# Patient Record
Sex: Female | Born: 1950
Health system: Southern US, Community
[De-identification: ages and names within clinical notes are randomized; demographics above are authoritative.]

## PROBLEM LIST (undated history)

## (undated) DIAGNOSIS — I10 Essential (primary) hypertension: Secondary | ICD-10-CM

## (undated) DIAGNOSIS — D649 Anemia, unspecified: Secondary | ICD-10-CM

## (undated) DIAGNOSIS — N951 Menopausal and female climacteric states: Secondary | ICD-10-CM

## (undated) DIAGNOSIS — I639 Cerebral infarction, unspecified: Secondary | ICD-10-CM

## (undated) DIAGNOSIS — M25561 Pain in right knee: Secondary | ICD-10-CM

## (undated) DIAGNOSIS — J302 Other seasonal allergic rhinitis: Secondary | ICD-10-CM

## (undated) DIAGNOSIS — N939 Abnormal uterine and vaginal bleeding, unspecified: Secondary | ICD-10-CM

## (undated) HISTORY — DX: Abnormal uterine and vaginal bleeding, unspecified: N93.9

## (undated) HISTORY — DX: Other seasonal allergic rhinitis: J30.2

## (undated) HISTORY — PX: COLONOSCOPY: SHX174

## (undated) HISTORY — DX: Essential (primary) hypertension: I10

## (undated) HISTORY — DX: Pain in right knee: M25.561

## (undated) HISTORY — DX: Menopausal and female climacteric states: N95.1

## (undated) HISTORY — DX: Anemia, unspecified: D64.9

## (undated) HISTORY — DX: Cerebral infarction, unspecified: I63.9

## (undated) HISTORY — PX: COLON SURGERY: SHX602

---

## 2005-02-13 ENCOUNTER — Ambulatory Visit: Payer: Self-pay | Admitting: Internal Medicine

## 2006-04-08 ENCOUNTER — Ambulatory Visit: Payer: Self-pay | Admitting: Obstetrics and Gynecology

## 2006-07-11 ENCOUNTER — Ambulatory Visit: Payer: Self-pay | Admitting: Gastroenterology

## 2007-04-24 ENCOUNTER — Ambulatory Visit: Payer: Self-pay | Admitting: Obstetrics and Gynecology

## 2008-06-10 ENCOUNTER — Ambulatory Visit: Payer: Self-pay | Admitting: Obstetrics and Gynecology

## 2009-06-23 ENCOUNTER — Ambulatory Visit: Payer: Self-pay | Admitting: Obstetrics and Gynecology

## 2010-06-27 ENCOUNTER — Ambulatory Visit: Payer: Self-pay | Admitting: Obstetrics and Gynecology

## 2011-07-03 ENCOUNTER — Ambulatory Visit: Payer: Self-pay | Admitting: Internal Medicine

## 2012-07-03 ENCOUNTER — Ambulatory Visit: Payer: Self-pay | Admitting: Family Medicine

## 2012-07-08 ENCOUNTER — Ambulatory Visit: Payer: Self-pay | Admitting: Family Medicine

## 2013-07-09 ENCOUNTER — Ambulatory Visit: Payer: Self-pay | Admitting: Obstetrics and Gynecology

## 2013-11-18 DIAGNOSIS — I10 Essential (primary) hypertension: Secondary | ICD-10-CM | POA: Insufficient documentation

## 2013-11-18 DIAGNOSIS — D649 Anemia, unspecified: Secondary | ICD-10-CM | POA: Insufficient documentation

## 2013-11-18 HISTORY — DX: Anemia, unspecified: D64.9

## 2013-11-18 HISTORY — DX: Essential (primary) hypertension: I10

## 2014-07-13 ENCOUNTER — Ambulatory Visit: Payer: Self-pay | Admitting: Obstetrics and Gynecology

## 2014-08-05 DIAGNOSIS — N951 Menopausal and female climacteric states: Secondary | ICD-10-CM | POA: Insufficient documentation

## 2014-08-05 HISTORY — DX: Menopausal and female climacteric states: N95.1

## 2014-12-09 DIAGNOSIS — J302 Other seasonal allergic rhinitis: Secondary | ICD-10-CM

## 2014-12-09 HISTORY — DX: Other seasonal allergic rhinitis: J30.2

## 2015-05-10 ENCOUNTER — Other Ambulatory Visit: Payer: Self-pay | Admitting: Obstetrics and Gynecology

## 2015-05-10 DIAGNOSIS — Z1231 Encounter for screening mammogram for malignant neoplasm of breast: Secondary | ICD-10-CM

## 2015-07-19 ENCOUNTER — Ambulatory Visit
Admission: RE | Admit: 2015-07-19 | Discharge: 2015-07-19 | Disposition: A | Discharge status: Home / Self Care | Payer: No Typology Code available for payment source | Source: Ambulatory Visit | Attending: Obstetrics and Gynecology | Admitting: Obstetrics and Gynecology

## 2015-07-19 DIAGNOSIS — Z1231 Encounter for screening mammogram for malignant neoplasm of breast: Secondary | ICD-10-CM | POA: Diagnosis not present

## 2015-07-26 DIAGNOSIS — M25561 Pain in right knee: Secondary | ICD-10-CM

## 2015-07-26 HISTORY — DX: Pain in right knee: M25.561

## 2016-02-11 ENCOUNTER — Ambulatory Visit
Admission: EM | Admit: 2016-02-11 | Discharge: 2016-02-11 | Disposition: A | Discharge status: Home / Self Care | Payer: PPO | Attending: Family Medicine | Admitting: Family Medicine

## 2016-02-11 DIAGNOSIS — M5442 Lumbago with sciatica, left side: Secondary | ICD-10-CM

## 2016-02-11 HISTORY — DX: Essential (primary) hypertension: I10

## 2016-02-11 LAB — URINALYSIS COMPLETE WITH MICROSCOPIC (ARMC ONLY)
BILIRUBIN URINE: NEGATIVE
Bacteria, UA: NONE SEEN
GLUCOSE, UA: NEGATIVE mg/dL
Hgb urine dipstick: NEGATIVE
Leukocytes, UA: NEGATIVE
Nitrite: NEGATIVE
Protein, ur: NEGATIVE mg/dL
RBC / HPF: NONE SEEN RBC/hpf (ref 0–5)
SQUAMOUS EPITHELIAL / LPF: NONE SEEN
Specific Gravity, Urine: 1.015 (ref 1.005–1.030)
pH: 7.5 (ref 5.0–8.0)

## 2016-02-11 MED ORDER — TRAMADOL HCL 50 MG PO TABS: 50.0000 mg | ORAL_TABLET | Freq: Three times a day (TID) | ORAL | Status: DC | PRN

## 2016-02-11 MED ORDER — MELOXICAM 15 MG PO TABS: 15.0000 mg | ORAL_TABLET | Freq: Every day | ORAL | Status: DC | PRN

## 2016-02-11 NOTE — Discharge Instructions (Signed)
Take medication as prescribed. Rest. Apply ice. Stretch. Take your home medication as prescribed.  Follow up with your primary care physician this week. Return to Urgent care for new or worsening concerns.    Sciatica Sciatica is pain, weakness, numbness, or tingling along the path of the sciatic nerve. The nerve starts in the lower back and runs down the back of each leg. The nerve controls the muscles in the lower leg and in the back of the knee, while also providing sensation to the back of the thigh, lower leg, and the sole of your foot. Sciatica is a symptom of another medical condition. For instance, nerve damage or certain conditions, such as a herniated disk or bone spur on the spine, pinch or put pressure on the sciatic nerve. This causes the pain, weakness, or other sensations normally associated with sciatica. Generally, sciatica only affects one side of the body. CAUSES   Herniated or slipped disc.  Degenerative disk disease.  A pain disorder involving the narrow muscle in the buttocks (piriformis syndrome).  Pelvic injury or fracture.  Pregnancy.  Tumor (rare). SYMPTOMS  Symptoms can vary from mild to very severe. The symptoms usually travel from the low back to the buttocks and down the back of the leg. Symptoms can include:  Mild tingling or dull aches in the lower back, leg, or hip.  Numbness in the back of the calf or sole of the foot.  Burning sensations in the lower back, leg, or hip.  Sharp pains in the lower back, leg, or hip.  Leg weakness.  Severe back pain inhibiting movement. These symptoms may get worse with coughing, sneezing, laughing, or prolonged sitting or standing. Also, being overweight may worsen symptoms. DIAGNOSIS  Your caregiver will perform a physical exam to look for common symptoms of sciatica. He or she may ask you to do certain movements or activities that would trigger sciatic nerve pain. Other tests may be performed to find the cause of  the sciatica. These may include:  Blood tests.  X-rays.  Imaging tests, such as an MRI or CT scan. TREATMENT  Treatment is directed at the cause of the sciatic pain. Sometimes, treatment is not necessary and the pain and discomfort goes away on its own. If treatment is needed, your caregiver may suggest:  Over-the-counter medicines to relieve pain.  Prescription medicines, such as anti-inflammatory medicine, muscle relaxants, or narcotics.  Applying heat or ice to the painful area.  Steroid injections to lessen pain, irritation, and inflammation around the nerve.  Reducing activity during periods of pain.  Exercising and stretching to strengthen your abdomen and improve flexibility of your spine. Your caregiver may suggest losing weight if the extra weight makes the back pain worse.  Physical therapy.  Surgery to eliminate what is pressing or pinching the nerve, such as a bone spur or part of a herniated disk. HOME CARE INSTRUCTIONS   Only take over-the-counter or prescription medicines for pain or discomfort as directed by your caregiver.  Apply ice to the affected area for 20 minutes, 3-4 times a day for the first 48-72 hours. Then try heat in the same way.  Exercise, stretch, or perform your usual activities if these do not aggravate your pain.  Attend physical therapy sessions as directed by your caregiver.  Keep all follow-up appointments as directed by your caregiver.  Do not wear high heels or shoes that do not provide proper support.  Check your mattress to see if it is too soft.  A firm mattress may lessen your pain and discomfort. SEEK IMMEDIATE MEDICAL CARE IF:   You lose control of your bowel or bladder (incontinence).  You have increasing weakness in the lower back, pelvis, buttocks, or legs.  You have redness or swelling of your back.  You have a burning sensation when you urinate.  You have pain that gets worse when you lie down or awakens you at  night.  Your pain is worse than you have experienced in the past.  Your pain is lasting longer than 4 weeks.  You are suddenly losing weight without reason. MAKE SURE YOU:  Understand these instructions.  Will watch your condition.  Will get help right away if you are not doing well or get worse.   This information is not intended to replace advice given to you by your health care provider. Make sure you discuss any questions you have with your health care provider.   Document Released: 07/03/2001 Document Revised: 03/30/2015 Document Reviewed: 11/18/2011 Elsevier Interactive Patient Education [employer]

## 2016-02-11 NOTE — ED Provider Notes (Signed)
[facility]  ____________________________________________  Time seen: Approximately 12:40 PM  I have reviewed the triage vital signs and the nursing notes.   HISTORY  Chief Complaint Back Pain   HPI Abigail Malone is a 65 y.o. female presents with a complaint of left lower back pain. Patient reports pain is been present 4 days and gradual onset. Patient reports that just prior to onset of back pain, the 2 days before she increased her walking and exercise regimen. Patient states she's trying to lose weight and has been walking much more frequently this week. Denies any fall or direct trauma. Patient states pain is primarily with movement. Patient states that she sits completely still she has minimal pain. Patient states pain is in the left lower back buttocks region. Patient states occasionally the pain will radiate down the back of her left leg but does not pass her knee. Denies any numbness or tingling sensation. Denies any urinary or bowel retention or incontinence. Patient reports that she has felt like she's had a slight increase of urinary frequency 2 days ago but states she is also been drinking more water. States over-the-counter Aleve helps with pain.  Denies fall or direct trauma. Denies numbness or tingling sensation. Denies recent sickness.  Patient reports she does have "a lot of arthritis "an intermediate does have low back pain that is slightly similar to current. Denies chest pain, shortness of breath, abdominal pain, urinary pain, vaginal complaints, blood in urine, blood in stool, dizziness, weakness or other complaints. Reports has remained ambulatory and has remained active. Denies any history of renal disease.   PCP: Elmer Ramp   Past Medical History  Diagnosis Date  . Hypertension     There are no active problems to display for this patient.   History reviewed. No pertinent past surgical history.  Current Outpatient Rx  Name  Route  Sig  Dispense   Refill  . amLODipine (NORVASC) 5 MG tablet   Oral   Take 5 mg by mouth daily. Pt has not been taken from past 2 week giving tingling and cramping so stop taking it.         Marland Kitchen lisinopril (PRINIVIL,ZESTRIL) 20 MG tablet   Oral   Take 20 mg by mouth daily.         .           .             Allergies Review of patient's allergies indicates no known allergies.  No family history on file.  Social History Social History  Substance Use Topics  . Smoking status: Former Games developer  . Smokeless tobacco: None  . Alcohol Use: Yes    Review of Systems Constitutional: No fever/chills Eyes: No visual changes. ENT: No sore throat. Cardiovascular: Denies chest pain. Respiratory: Denies shortness of breath. Gastrointestinal: No abdominal pain.  No nausea, no vomiting.  No diarrhea.  No constipation. Genitourinary: Negative for dysuria. Musculoskeletal: positive for back pain. Skin: Negative for rash. Neurological: Negative for headaches, focal weakness or numbness.  10-point ROS otherwise negative.  ____________________________________________   PHYSICAL EXAM:  VITAL SIGNS: ED Triage Vitals  Enc Vitals Group     BP 02/11/16 1130 185/141 mmHg     Pulse Rate 02/11/16 1130 77     Resp 02/11/16 1130 16     Temp 02/11/16 1130 97.2 F (36.2 C)     Temp Source 02/11/16 1130 Oral     SpO2 02/11/16 1130 100 %  Weight 02/11/16 1130 140 lb (63.504 kg)     Height 02/11/16 1130 5" (0.127 m)     Head Cir --      Peak Flow --      Pain Score 02/11/16 1130 8     Pain Loc --      Pain Edu? --      Excl. in GC? --    Today's Vitals   02/11/16 1130 02/11/16 1134 02/11/16 1239  BP: 185/141 155/74   Pulse: 77    Temp: 97.2 F (36.2 C)    TempSrc: Oral    Resp: 16    Height: 5" (0.127 m)    Weight: 140 lb (63.504 kg)    SpO2: 100%    PainSc: 8   0-No pain   States has taken her amlodipine yet today.   Constitutional: Alert and oriented. Well appearing and in no acute  distress. Eyes: Conjunctivae are normal. PERRL. EOMI. Head: Atraumatic.  Ears: normal external appearance bilaterally.   Nose: No congestion/rhinnorhea.  Mouth/Throat: Mucous membranes are moist.  Oropharynx non-erythematous. Neck: No stridor.  No cervical spine tenderness to palpation. Cardiovascular: Normal rate, regular rhythm. Grossly normal heart sounds.  Good peripheral circulation. Respiratory: Normal respiratory effort.  No retractions. Lungs CTAB. No wheezes, rales or rhonchi.  Gastrointestinal: Soft and nontender. No distention. Normal Bowel sounds.  No CVA tenderness. Musculoskeletal: No lower or upper extremity tenderness nor edema.  Bilateral pedal pulses equal and easily palpated. No midline cervical, thoracic or lumbar tenderness to palpation. Patient with mild to moderate left greater sciatic notch tenderness and mild left paralumbar tenderness to palpation, no midline tenderness, no swelling, no ecchymosis, no erythema, no saddle anesthesia, mild left lower back pain with left straight leg raise, steady gait, changes positions quickly in room, bilateral plantar flexion and dorsiflexion strong and equal, full range of motion but pain present with lumbar twisting and left leg left, no calf tenderness bilaterally. Neurologic:  Normal speech and language. No gross focal neurologic deficits are appreciated. No gait instability. Skin:  Skin is warm, dry and intact. No rash noted. Psychiatric: Mood and affect are normal. Speech and behavior are normal.  ____________________________________________   LABS (all labs ordered are listed, but only abnormal results are displayed)  Labs Reviewed  URINALYSIS COMPLETEWITH MICROSCOPIC ([facility] ONLY) - Abnormal; Notable for the following:    Ketones, ur TRACE (*)    All other components within normal limits   Patients most recent labs in epic reviewed.    INITIAL IMPRESSION / ASSESSMENT AND PLAN / ED COURSE  Pertinent labs & imaging  results that were available during my care of the patient were reviewed by me and considered in my medical decision making (see chart for details).  Very well-appearing patient. No acute distress. No focal neurological deficits. Patient with point tenderness left lower back and greater sciatic notch. Full range of motion but pain present with lumbar twisting and left leg left. No trauma. Suspect  left lower sciatica secondary to recent increase in activity and walking. Will also evaluate urinalysis to ensure no UTI.  Urinalysis reviewed, and no bacteria, clear, no leukocytes, no protein and no RBCs. Suspect left sciatica. Discussed with patient we'll treat conservatively with oral Mobic and when necessary tramadol. Encouraged ice, stretching and avoidance of reinjury. Encourage PCP follow-up for pain as needed. Also encouraged patient to follow-up regarding her blood pressure and to ensure she's taking her medication as prescribed. Discussed indication, risks and benefits of medications with  patient.  Discussed follow up with Primary care physician this week. Discussed follow up and return parameters including no resolution or any worsening concerns. Patient verbalized understanding and agreed to plan.   ____________________________________________   FINAL CLINICAL IMPRESSION(S) / ED DIAGNOSES  Final diagnoses:  Left-sided low back pain with left-sided sciatica     New Prescriptions   MELOXICAM (MOBIC) 15 MG TABLET    Take 1 tablet (15 mg total) by mouth daily as needed for pain.   TRAMADOL (ULTRAM) 50 MG TABLET    Take 1 tablet (50 mg total) by mouth every 8 (eight) hours as needed (Do not drive or operate machinery while taking as can cause drowsiness.).    Note: This dictation was prepared with Dragon dictation along with smaller phrase technology. Any transcriptional errors that result from this process are unintentional.       Renford Dills, NP 02/11/16 1358

## 2016-02-11 NOTE — ED Notes (Signed)
As per pt had lower back pain radiate down to legs, hip Left side, onset 4 days hard ROM or bend over pt used Aleve 2 tab 200 mg and done heat pad.

## 2016-02-21 DIAGNOSIS — M5442 Lumbago with sciatica, left side: Secondary | ICD-10-CM | POA: Diagnosis not present

## 2016-05-15 ENCOUNTER — Other Ambulatory Visit: Payer: Self-pay | Admitting: Obstetrics and Gynecology

## 2016-05-15 DIAGNOSIS — Z1231 Encounter for screening mammogram for malignant neoplasm of breast: Secondary | ICD-10-CM

## 2016-05-15 DIAGNOSIS — Z124 Encounter for screening for malignant neoplasm of cervix: Secondary | ICD-10-CM | POA: Diagnosis not present

## 2016-05-15 DIAGNOSIS — Z1151 Encounter for screening for human papillomavirus (HPV): Secondary | ICD-10-CM | POA: Diagnosis not present

## 2016-05-15 DIAGNOSIS — N950 Postmenopausal bleeding: Secondary | ICD-10-CM | POA: Diagnosis not present

## 2016-05-15 DIAGNOSIS — N939 Abnormal uterine and vaginal bleeding, unspecified: Secondary | ICD-10-CM | POA: Diagnosis not present

## 2016-05-15 DIAGNOSIS — Z01419 Encounter for gynecological examination (general) (routine) without abnormal findings: Secondary | ICD-10-CM | POA: Diagnosis not present

## 2016-05-15 DIAGNOSIS — Z1211 Encounter for screening for malignant neoplasm of colon: Secondary | ICD-10-CM | POA: Diagnosis not present

## 2016-05-15 HISTORY — DX: Abnormal uterine and vaginal bleeding, unspecified: N93.9

## 2016-05-25 DIAGNOSIS — N939 Abnormal uterine and vaginal bleeding, unspecified: Secondary | ICD-10-CM | POA: Diagnosis not present

## 2016-05-31 DIAGNOSIS — N939 Abnormal uterine and vaginal bleeding, unspecified: Secondary | ICD-10-CM | POA: Diagnosis not present

## 2016-06-19 DIAGNOSIS — Z1211 Encounter for screening for malignant neoplasm of colon: Secondary | ICD-10-CM | POA: Diagnosis not present

## 2016-06-19 DIAGNOSIS — I10 Essential (primary) hypertension: Secondary | ICD-10-CM | POA: Diagnosis not present

## 2016-07-19 ENCOUNTER — Ambulatory Visit: Admission: RE | Admit: 2016-07-19 | Payer: PPO | Source: Ambulatory Visit

## 2016-07-26 DIAGNOSIS — K648 Other hemorrhoids: Secondary | ICD-10-CM | POA: Diagnosis not present

## 2016-07-26 DIAGNOSIS — Z1211 Encounter for screening for malignant neoplasm of colon: Secondary | ICD-10-CM | POA: Diagnosis not present

## 2016-07-31 ENCOUNTER — Ambulatory Visit
Admission: RE | Admit: 2016-07-31 | Discharge: 2016-07-31 | Disposition: A | Discharge status: Home / Self Care | Payer: PPO | Source: Ambulatory Visit | Attending: Obstetrics and Gynecology | Admitting: Obstetrics and Gynecology

## 2016-07-31 DIAGNOSIS — Z1231 Encounter for screening mammogram for malignant neoplasm of breast: Secondary | ICD-10-CM | POA: Insufficient documentation

## 2016-09-04 DIAGNOSIS — J019 Acute sinusitis, unspecified: Secondary | ICD-10-CM | POA: Diagnosis not present

## 2016-09-04 DIAGNOSIS — I10 Essential (primary) hypertension: Secondary | ICD-10-CM | POA: Diagnosis not present

## 2016-09-18 DIAGNOSIS — J3089 Other allergic rhinitis: Secondary | ICD-10-CM | POA: Diagnosis not present

## 2017-03-05 DIAGNOSIS — I8393 Asymptomatic varicose veins of bilateral lower extremities: Secondary | ICD-10-CM | POA: Diagnosis not present

## 2017-03-05 DIAGNOSIS — Z131 Encounter for screening for diabetes mellitus: Secondary | ICD-10-CM | POA: Diagnosis not present

## 2017-03-05 DIAGNOSIS — Z79899 Other long term (current) drug therapy: Secondary | ICD-10-CM | POA: Diagnosis not present

## 2017-03-05 DIAGNOSIS — J302 Other seasonal allergic rhinitis: Secondary | ICD-10-CM | POA: Diagnosis not present

## 2017-03-05 DIAGNOSIS — I10 Essential (primary) hypertension: Secondary | ICD-10-CM | POA: Diagnosis not present

## 2017-03-05 DIAGNOSIS — Z23 Encounter for immunization: Secondary | ICD-10-CM | POA: Diagnosis not present

## 2017-03-05 DIAGNOSIS — Z1159 Encounter for screening for other viral diseases: Secondary | ICD-10-CM | POA: Diagnosis not present

## 2017-03-05 DIAGNOSIS — R252 Cramp and spasm: Secondary | ICD-10-CM | POA: Diagnosis not present

## 2017-03-05 DIAGNOSIS — E669 Obesity, unspecified: Secondary | ICD-10-CM | POA: Diagnosis not present

## 2017-03-19 DIAGNOSIS — H2513 Age-related nuclear cataract, bilateral: Secondary | ICD-10-CM | POA: Diagnosis not present

## 2017-03-22 DIAGNOSIS — I10 Essential (primary) hypertension: Secondary | ICD-10-CM | POA: Diagnosis not present

## 2017-06-25 ENCOUNTER — Other Ambulatory Visit: Payer: Self-pay | Admitting: Obstetrics and Gynecology

## 2017-06-25 DIAGNOSIS — Z1211 Encounter for screening for malignant neoplasm of colon: Secondary | ICD-10-CM | POA: Diagnosis not present

## 2017-06-25 DIAGNOSIS — Z1231 Encounter for screening mammogram for malignant neoplasm of breast: Secondary | ICD-10-CM

## 2017-06-25 DIAGNOSIS — Z124 Encounter for screening for malignant neoplasm of cervix: Secondary | ICD-10-CM | POA: Diagnosis not present

## 2017-07-11 NOTE — Progress Notes (Signed)
07/12/2017 9:43 AM   Abigail Malone [birth year] [id number]  Referring provider: Sula Rumple, MD No address on file  Chief Complaint  Patient presents with  . Urinary Incontinence    New Patient    HPI: Patient is a 66 -year-old [place] American female who is referred to [place] by, Dalbert Batman, CNM, for urinary incontinence.  Patient states that she has had urinary incontinence for over one year.    Patient has incontinence with urge and stress.   She is experiencing 2 incontinent episodes during the day. She is experiencing no incontinent episodes during the night.  Her incontinence volume is large, sometimes soaking through her clothes.   She is wearing 2 to 3 pads/depends daily.  (POISE # 3/4)  She is having associated urgency.  Her PVR is 14 mL.    She does not have a history of urinary tract infections, STI's or injury to the bladder.   She denies dysuria, gross hematuria, suprapubic pain, back pain, abdominal pain or flank pain.  She has not had any recent fevers, chills, nausea or vomiting.    She does not have a history of nephrolithiasis, GU surgery or GU trauma.   She is not sexually active.  She is post menopausal.   She admits to diarrhea.   She is not having pain with bladder filling.    She has not had any recent imaging studies.    She is drinking 2 to 3 bottles of water daily.   She is drinking 1 to 2 caffeinated beverages daily.   No sodas.  She is not drinking alcoholic beverages daily.    Her risk factors for incontinence are obesity, vaginal delivery, a family history of incontinence, age, caffeine and vaginal atrophy.    She is taking ACE inhibitors.      PMH: Past Medical History:  Diagnosis Date  . Abnormal uterine bleeding (AUB) 05/15/2016  . Allergic rhinitis, seasonal 12/09/2014  . Anemia, unspecified 11/18/2013  . HTN (hypertension) 11/18/2013  . Hypertension   . Menopausal symptoms 08/05/2014  . Right knee pain 07/26/2015     Surgical History: Past Surgical History:  Procedure Laterality Date  . COLON SURGERY      Home Medications:  Allergies as of 07/12/2017      Reactions   Amlodipine    Other reaction(s): Other (See Comments) Leg cramping      Medication List        Accurate as of 07/12/17  9:43 AM. Always use your most recent med list.          amLODipine 5 MG tablet Commonly known as:  NORVASC Take 5 mg by mouth daily. Pt has not been taken from past 2 week giving tingling and cramping so stop taking it.   conjugated estrogens vaginal cream Commonly known as:  PREMARIN Place 1 Applicatorful vaginally daily. Apply 0.5mg  (pea-sized amount)  just inside the vaginal introitus with a finger-tip on  Monday, Wednesday and Friday nights.   estradiol 0.1 MG/GM vaginal cream Commonly known as:  ESTRACE VAGINAL Apply 0.5mg  (pea-sized amount)  just inside the vaginal introitus with a finger-tip on Monday, Wednesday and Friday nights.   lisinopril-hydrochlorothiazide 20-12.5 MG tablet Commonly known as:  PRINZIDE,ZESTORETIC Take by mouth.       Allergies:  Allergies  Allergen Reactions  . Amlodipine     Other reaction(s): Other (See Comments) Leg cramping    Family History: Family History  Problem Relation Age of Onset  .  Hematuria Mother   . Liver cancer Mother   . Prostate cancer Father   . Kidney failure Brother   . Breast cancer Neg Hx     Social History:  reports that she has quit smoking. she has never used smokeless tobacco. She reports that she drinks alcohol. She reports that she does not use drugs.  ROS: UROLOGY Frequent Urination?: No Hard to postpone urination?: Yes Burning/pain with urination?: No Get up at night to urinate?: No Leakage of urine?: No Urine stream starts and stops?: No Trouble starting stream?: No Do you have to strain to urinate?: No Blood in urine?: No Urinary tract infection?: No Sexually transmitted disease?: No Injury to kidneys or  bladder?: No Painful intercourse?: No Weak stream?: No Currently pregnant?: No Vaginal bleeding?: No Last menstrual period?: n  Gastrointestinal Nausea?: No Vomiting?: No Indigestion/heartburn?: No Diarrhea?: No Constipation?: No  Constitutional Fever: No Night sweats?: Yes Weight loss?: No Fatigue?: Yes  Skin Skin rash/lesions?: No Itching?: No  Eyes Blurred vision?: No Double vision?: No  Ears/Nose/Throat Sore throat?: No Sinus problems?: Yes  Hematologic/Lymphatic Swollen glands?: No Easy bruising?: No  Cardiovascular Leg swelling?: No Chest pain?: No  Respiratory Cough?: No Shortness of breath?: No  Endocrine Excessive thirst?: No  Musculoskeletal Back pain?: Yes Joint pain?: Yes  Neurological Headaches?: No Dizziness?: No  Psychologic Depression?: No Anxiety?: Yes  Physical Exam: BP (!) 146/77   Pulse 72   Ht 4\' 11"  (1.499 m)   Wt 158 lb (71.7 kg)   BMI 31.91 kg/m   Constitutional: Well nourished. Alert and oriented, No acute distress. HEENT: [place] AT, moist mucus membranes. Trachea midline, no masses. Cardiovascular: No clubbing, cyanosis, or edema. Respiratory: Normal respiratory effort, no increased work of breathing. GI: Abdomen is soft, non tender, non distended, no abdominal masses. Liver and spleen not palpable.  No hernias appreciated.  Stool sample for occult testing is not indicated.   GU: No CVA tenderness.  No bladder fullness or masses.  Arophic external genitalia, normal pubic hair distribution, no lesions.  Normal urethral meatus, no lesions, no prolapse, no discharge.   No urethral masses, tenderness and/or tenderness. No bladder fullness, tenderness or masses. pale vagina mucosa, good estrogen effect, no discharge, no lesions, good pelvic support, Grade I cystocele.  No rectocele.  No cervical motion tenderness.  Uterus is freely mobile and non-fixed.  No adnexal/parametria masses or tenderness noted.  Anus and perineum are  without rashes or lesions.    Skin: No rashes, bruises or suspicious lesions. Lymph: No cervical or inguinal adenopathy. Neurologic: Grossly intact, no focal deficits, moving all 4 extremities. Psychiatric: Normal mood and affect.  Laboratory Data: Baseline creatinine 0.6 I have reviewed the labs.   Pertinent Imaging: Results for Abigail ProseWALKER, Abigail Malone (MRN [id number]) as of 07/12/2017 09:27  Ref. Range 07/12/2017 09:10  Scan Result Unknown 14ml     Assessment & Plan:    1.Mixed and urge incontinence  - offered behavioral therapies, bladder training, bladder control strategies and  pelvic floor muscle training - patient deferred  - fluid management - good water intake  - offered medical therapy with anticholinergic therapy or beta-3 adrenergic receptor agonist and the potential side effects of each therapy   - would like to try the beta-3 adrenergic receptor agonist (Myrbetriq).  Given Myrbetriq 25 mg samples, #28.  I have reviewed with the patient of the side effects of Myrbetriq, such as: elevation in BP, urinary retention and/or HA.    - RTC in  3 weeks for PVR and symptom recheck   2. Vaginal atrophy  - I explained to the patient that when women go through menopause and her estrogen levels are severely diminished, the normal vaginal flora will change.  This is due to an increase of the vaginal canal's pH. Because of this, the vaginal canal may be colonized by bacteria from the rectum instead of the protective lactobacillus.  This, accompanied by the loss of the mucus barrier with vaginal atrophy, is a cause of recurrent urinary tract infections.  - In some studies, the use of vaginal estrogen cream has been demonstrated to reduce  recurrent urinary tract infections to one a year.   - Patient was given a sample of vaginal estrogen cream (Premarin vaginal cream) and instructed to apply 0.5mg  (pea-sized amount)  just inside the vaginal introitus with a finger-tip on Monday, Wednesday and  Friday nights.  I explained to the patient that vaginally administered estrogen, which causes only a slight increase in the blood estrogen levels, have fewer contraindications and adverse systemic effects that oral HT.  - I have also given prescriptions for the Estrace cream and Premarin cream, so that the patient may carry them to the pharmacy to see which one of the branded creams would be most economical for her.  If she finds both medications cost prohibitive, she is instructed to call the office.  We can then call in a compounded vaginal estrogen cream for the patient that may be more affordable.    - She will follow up in three months for an exam.     Return in about 3 weeks (around 08/02/2017) for PVR and OAB questionnaire.  These notes generated with voice recognition software. I apologize for typographical errors.  Michiel CowboySHANNON Mayank Teuscher, PA-C  [facility] [address], [address] [place], [place] [address] [phone]

## 2017-07-12 ENCOUNTER — Encounter: Payer: Self-pay | Admitting: Urology

## 2017-07-12 ENCOUNTER — Ambulatory Visit (INDEPENDENT_AMBULATORY_CARE_PROVIDER_SITE_OTHER): Payer: PPO | Admitting: Urology

## 2017-07-12 VITALS — BP 146/77 | HR 72 | Ht 59.0 in | Wt 158.0 lb

## 2017-07-12 DIAGNOSIS — N952 Postmenopausal atrophic vaginitis: Secondary | ICD-10-CM

## 2017-07-12 DIAGNOSIS — N3946 Mixed incontinence: Secondary | ICD-10-CM | POA: Diagnosis not present

## 2017-07-12 LAB — BLADDER SCAN AMB NON-IMAGING

## 2017-07-12 MED ORDER — ESTROGENS, CONJUGATED 0.625 MG/GM VA CREA: 1.0000 | TOPICAL_CREAM | Freq: Every day | VAGINAL | 12 refills | Status: DC

## 2017-07-12 MED ORDER — ESTRADIOL 0.1 MG/GM VA CREA: TOPICAL_CREAM | VAGINAL | 12 refills | Status: DC

## 2017-07-12 NOTE — Patient Instructions (Signed)
It is important that you drink 6 to 8 glasses of water daily.  Half of the fluid intake should be water. Janina Mayorach most of your fluids before dinner. Don't drink too much liquid at once, sip instead of gulp.  There are certain beverages and foods that have known to the bladder irritants and they should be avoided or consumed infrequently. These include but not limited to beverages and foods containing caffeine, carbonated beverages, highly acidic or spicy foods, such as fruits, tomato products, artificial sweeteners and alcohol.  If you find it difficult to avoid these foods it may be helpful to alternate water in between consuming these foods.  It is important to stay well hydrated and eat 24 to 30 grams of fiber daily.    It has shown that even weight loss can help with urinary incontinence. In this study it demonstrated that and a percent weight loss and obese women (20 pound loss for a 250 pound woman) cut the number of incontinence episodes nearly in half.  I have given you two prescriptions for a vaginal estrogen cream.  Estrace and Premarin.  Please take these to your pharmacy and see which one your insurance covers.  If both are too expensive, please call the office at [phone] for an alternative.  Apply 0.5mg  (pea-sized amount)  just inside the vaginal introitus with a finger-tip on Monday, Wednesday and Friday nights,

## 2017-08-01 ENCOUNTER — Ambulatory Visit
Admission: RE | Admit: 2017-08-01 | Discharge: 2017-08-01 | Disposition: A | Discharge status: Home / Self Care | Payer: PPO | Source: Ambulatory Visit | Attending: Obstetrics and Gynecology | Admitting: Obstetrics and Gynecology

## 2017-08-01 DIAGNOSIS — Z1231 Encounter for screening mammogram for malignant neoplasm of breast: Secondary | ICD-10-CM

## 2017-08-01 NOTE — Progress Notes (Signed)
08/02/2017 9:24 AM   [name] 08-02-1950 [id number]  Referring provider: Cheryll Dessert, FNP [address] [name], [place] [address]  Chief Complaint  Patient presents with  . Urinary Incontinence    3wk w/PVR    HPI: 67 yo AAF with mixed urinary incontinence and vaginal atrophy who presents today for a three week follow up after a trial of Myrbetriq.  Background history Patient is a 75 -year-old [place] American female who is referred to [place] by, Dalbert Batman, CNM, for urinary incontinence.  Patient states that she has had urinary incontinence for over one year.  Patient has incontinence with urge and stress.   She is experiencing 2 incontinent episodes during the day. She is experiencing no incontinent episodes during the night.  Her incontinence volume is large, sometimes soaking through her clothes.   She is wearing 2 to 3 pads/depends daily.  (POISE # 3/4)  She is having associated urgency.  Her PVR is 14 mL.  She does not have a history of urinary tract infections, STI's or injury to the bladder.  She denies dysuria, gross hematuria, suprapubic pain, back pain, abdominal pain or flank pain.  She has not had any recent fevers, chills, nausea or vomiting.  She does not have a history of nephrolithiasis, GU surgery or GU trauma.   She is not sexually active.  She is post menopausal.  She admits to diarrhea.  She is not having pain with bladder filling.  She has not had any recent imaging studies.  She is drinking 2 to 3 bottles of water daily.   She is drinking 1 to 2 caffeinated beverages daily.   No sodas.  She is not drinking alcoholic beverages daily.   Her risk factors for incontinence are obesity, vaginal delivery, a family history of incontinence, age, caffeine and vaginal atrophy.  She is taking ACE inhibitors.      Today, she is having urgency x 0-3, frequency x 4-7, not restricting fluids to avoid visits to the restroom, is engaging in  toilet mapping, incontinence x 0-3 and nocturia x 0-3.   Her PVR is 86 mL.   Her BP is 154/72  She denies any gross hematuria, dysuria and suprapubic pain.  She is not having any fevers, chills, nausea or vomiting.   She is finding the Myrbetriq 25 mg daily is some what helpful.  PMH: Past Medical History:  Diagnosis Date  . Abnormal uterine bleeding (AUB) 05/15/2016  . Allergic rhinitis, seasonal 12/09/2014  . Anemia, unspecified 11/18/2013  . HTN (hypertension) 11/18/2013  . Hypertension   . Menopausal symptoms 08/05/2014  . Right knee pain 07/26/2015    Surgical History: Past Surgical History:  Procedure Laterality Date  . COLON SURGERY      Home Medications:  Allergies as of 08/02/2017      Reactions   Amlodipine    Other reaction(s): Other (See Comments) Leg cramping      Medication List        Accurate as of 08/02/17  9:24 AM. Always use your most recent med list.          amLODipine 5 MG tablet Commonly known as:  NORVASC Take 5 mg by mouth daily. Pt has not been taken from past 2 week giving tingling and cramping so stop taking it.   conjugated estrogens vaginal cream Commonly known as:  PREMARIN Place 1 Applicatorful vaginally daily. Apply 0.5mg  (pea-sized amount)  just inside the vaginal introitus  with a finger-tip on  Monday, Wednesday and Friday nights.   estradiol 0.1 MG/GM vaginal cream Commonly known as:  ESTRACE VAGINAL Apply 0.5mg  (pea-sized amount)  just inside the vaginal introitus with a finger-tip on Monday, Wednesday and Friday nights.   lisinopril-hydrochlorothiazide 20-12.5 MG tablet Commonly known as:  PRINZIDE,ZESTORETIC Take by mouth.       Allergies:  Allergies  Allergen Reactions  . Amlodipine     Other reaction(s): Other (See Comments) Leg cramping    Family History: Family History  Problem Relation Age of Onset  . Hematuria Mother   . Liver cancer Mother   . Prostate cancer Father   . Kidney failure Brother   . Breast cancer  Neg Hx     Social History:  reports that she has quit smoking. she has never used smokeless tobacco. She reports that she drinks alcohol. She reports that she does not use drugs.  ROS: UROLOGY Frequent Urination?: No Hard to postpone urination?: No Burning/pain with urination?: No Get up at night to urinate?: No Leakage of urine?: Yes Urine stream starts and stops?: No Trouble starting stream?: No Do you have to strain to urinate?: No Blood in urine?: No Urinary tract infection?: No Sexually transmitted disease?: No Injury to kidneys or bladder?: No Painful intercourse?: No Weak stream?: No Currently pregnant?: No Vaginal bleeding?: No Last menstrual period?: n  Gastrointestinal Nausea?: No Vomiting?: No Indigestion/heartburn?: No Diarrhea?: No Constipation?: No  Constitutional Fever: No Night sweats?: Yes Weight loss?: No Fatigue?: Yes  Skin Skin rash/lesions?: No Itching?: No  Eyes Blurred vision?: No Double vision?: No  Ears/Nose/Throat Sore throat?: No Sinus problems?: No  Hematologic/Lymphatic Swollen glands?: No Easy bruising?: No  Cardiovascular Leg swelling?: No Chest pain?: No  Respiratory Cough?: No Shortness of breath?: No  Endocrine Excessive thirst?: No  Musculoskeletal Back pain?: Yes Joint pain?: Yes  Neurological Headaches?: No Dizziness?: No  Psychologic Depression?: No Anxiety?: No  Physical Exam: BP (!) 154/72   Pulse 71   Ht 4\' 11"  (1.499 m)   Wt 150 lb (68 kg)   BMI 30.30 kg/m   Constitutional: Well nourished. Alert and oriented, No acute distress. HEENT: [place] AT, moist mucus membranes. Trachea midline, no masses. Cardiovascular: No clubbing, cyanosis, or edema. Respiratory: Normal respiratory effort, no increased work of breathing. Skin: No rashes, bruises or suspicious lesions. Lymph: No cervical or inguinal adenopathy. Neurologic: Grossly intact, no focal deficits, moving all 4 extremities. Psychiatric:  Normal mood and affect.    Laboratory Data: Baseline creatinine 0.6 I have reviewed the labs.   Pertinent Imaging: Results for Donald ProseWALKER, Indy NORA (MRN [id number]) as of 08/02/2017 09:10  Ref. Range 08/02/2017 09:05  Scan Result Unknown 86ml    Assessment & Plan:    1.Mixed incontinence  - offered behavioral therapies, bladder training, bladder control strategies and  pelvic floor muscle training - patient still deferred  - fluid management - good water intake  - discussed with patient the option of increasing the Myrbetriq to 50 mg daily vs trying an anticholinergic - she would like to double up on her Myrbetriq 25 mg for two weeks - if she finds it effective, she will call the office for a prescription for Myrbetriq 50 mg daily - if she does not find the Myrbetriq 50 mg daily effective, she will start Vesicare 5 mg daily  - RTC in 5 weeks for PVR and symptom recheck   2. Vaginal atrophy  - continue vaginal estrogen cream three nights weekly  -  She will follow up in three months for an exam.     Return in about 5 weeks (around 09/06/2017) for PVR and OAB questionnaire.  These notes generated with voice recognition software. I apologize for typographical errors.  Michiel Cowboy, PA-C  [facility] [address] [place], [place] [address] [phone]

## 2017-08-02 ENCOUNTER — Ambulatory Visit (INDEPENDENT_AMBULATORY_CARE_PROVIDER_SITE_OTHER): Payer: PPO | Admitting: Urology

## 2017-08-02 ENCOUNTER — Encounter: Payer: Self-pay | Admitting: Urology

## 2017-08-02 VITALS — BP 154/72 | HR 71 | Ht 59.0 in | Wt 150.0 lb

## 2017-08-02 DIAGNOSIS — N3946 Mixed incontinence: Secondary | ICD-10-CM | POA: Diagnosis not present

## 2017-08-02 DIAGNOSIS — N952 Postmenopausal atrophic vaginitis: Secondary | ICD-10-CM

## 2017-08-02 LAB — BLADDER SCAN AMB NON-IMAGING

## 2017-09-06 ENCOUNTER — Ambulatory Visit (INDEPENDENT_AMBULATORY_CARE_PROVIDER_SITE_OTHER): Payer: PPO | Admitting: Urology

## 2017-09-06 ENCOUNTER — Telehealth: Payer: Self-pay | Admitting: Urology

## 2017-09-06 ENCOUNTER — Encounter: Payer: Self-pay | Admitting: Urology

## 2017-09-06 VITALS — BP 156/70 | HR 80 | Ht 59.0 in | Wt 149.0 lb

## 2017-09-06 DIAGNOSIS — R32 Unspecified urinary incontinence: Secondary | ICD-10-CM

## 2017-09-06 DIAGNOSIS — N3946 Mixed incontinence: Secondary | ICD-10-CM

## 2017-09-06 DIAGNOSIS — N952 Postmenopausal atrophic vaginitis: Secondary | ICD-10-CM | POA: Diagnosis not present

## 2017-09-06 LAB — BLADDER SCAN AMB NON-IMAGING

## 2017-09-06 MED ORDER — MIRABEGRON ER 50 MG PO TB24: 50.0000 mg | ORAL_TABLET | Freq: Every day | ORAL | 11 refills | Status: DC

## 2017-09-06 NOTE — Telephone Encounter (Signed)
Would you see if you can help her with the Myrbetriq 50 mg daily?

## 2017-09-06 NOTE — Progress Notes (Addendum)
09/06/2017 9:33 AM   Abigail Malone Apr 06, 1951 [id number]  Referring provider: Cheryll Dessert, FNP [address] Abigail Malone [place], [place] [address]  Chief Complaint  Patient presents with  . Urinary Incontinence    5wk w/PVR    HPI: 67 yo AAF with mixed urinary incontinence and vaginal atrophy who presents today for a follow up after a comparison trial of Myrbetriq and Vesicare.    Background history Patient is a 53 -year-old [place] American female who is referred to [place] by, Abigail Malone, CNM, for urinary incontinence.  Patient states that she has had urinary incontinence for over one year.  Patient has incontinence with urge and stress.   She is experiencing 2 incontinent episodes during the day. She is experiencing no incontinent episodes during the night.  Her incontinence volume is large, sometimes soaking through her clothes.   She is wearing 2 to 3 pads/depends daily.  (POISE # 3/4)  She is having associated urgency.  Her PVR is 14 mL.  She does not have a history of urinary tract infections, STI's or injury to the bladder.  She denies dysuria, gross hematuria, suprapubic pain, back pain, abdominal pain or flank pain.  She has not had any recent fevers, chills, nausea or vomiting.  She does not have a history of nephrolithiasis, GU surgery or GU trauma.   She is not sexually active.  She is post menopausal.  She admits to diarrhea.  She is not having pain with bladder filling.  She has not had any recent imaging studies.  She is drinking 2 to 3 bottles of water daily.   She is drinking 1 to 2 caffeinated beverages daily.   No sodas.  She is not drinking alcoholic beverages daily.   Her risk factors for incontinence are obesity, vaginal delivery, a family history of incontinence, age, caffeine and vaginal atrophy.  She is taking ACE inhibitors.      On 08/02/2017, she was having urgency x 0-3, frequency x 4-7, not restricting fluids to avoid visits to the  restroom, is engaging in toilet mapping, incontinence x 0-3 and nocturia x 0-3.   Her PVR  86 mL.   Her BP 154/72  She denies any gross hematuria, dysuria and suprapubic pain.  She is not having any fevers, chills, nausea or vomiting.   She is finding the Myrbetriq 25 mg daily is some what helpful.  Today, the patient has been experiencing urgency x 0-3 (stable), frequency x 4-7 (stable), not restricting fluids to avoid visits to the restroom, not engaging in toilet mapping, incontinence x 0-3 (stable) and nocturia x 0-3 (stable).   Her BP is 156/70.  Her PVR is 34 mL.  She found the Myrbetriq 50 mg more helpful than the Vesicare 5 mg in managing her urinary symptoms.  Patient denies any gross hematuria, dysuria or suprapubic/flank pain.  Patient denies any fevers, chills, nausea or vomiting.     PMH: Past Medical History:  Diagnosis Date  . Abnormal uterine bleeding (AUB) 05/15/2016  . Allergic rhinitis, seasonal 12/09/2014  . Anemia, unspecified 11/18/2013  . HTN (hypertension) 11/18/2013  . Hypertension   . Menopausal symptoms 08/05/2014  . Right knee pain 07/26/2015    Surgical History: Past Surgical History:  Procedure Laterality Date  . COLON SURGERY      Home Medications:  Allergies as of 09/06/2017      Reactions   Amlodipine    Other reaction(s): Other (See Comments) Leg cramping  Medication List        Accurate as of 09/06/17  9:33 AM. Always use your most recent med list.          amLODipine 5 MG tablet Commonly known as:  NORVASC Take 5 mg by mouth daily. Pt has not been taken from past 2 week giving tingling and cramping so stop taking it.   estradiol 0.1 MG/GM vaginal cream Commonly known as:  ESTRACE VAGINAL Apply 0.5mg  (pea-sized amount)  just inside the vaginal introitus with a finger-tip on Monday, Wednesday and Friday nights.   lisinopril-hydrochlorothiazide 20-12.5 MG tablet Commonly known as:  PRINZIDE,ZESTORETIC Take by mouth.   mirabegron ER 50 MG  Tb24 tablet Commonly known as:  MYRBETRIQ Take 1 tablet (50 mg total) by mouth daily.       Allergies:  Allergies  Allergen Reactions  . Amlodipine     Other reaction(s): Other (See Comments) Leg cramping    Family History: Family History  Problem Relation Age of Onset  . Hematuria Mother   . Liver cancer Mother   . Prostate cancer Father   . Kidney failure Brother   . Breast cancer Neg Hx     Social History:  reports that she has quit smoking. she has never used smokeless tobacco. She reports that she drinks alcohol. She reports that she does not use drugs.  ROS: UROLOGY Frequent Urination?: No Hard to postpone urination?: Yes Burning/pain with urination?: No Get up at night to urinate?: No Leakage of urine?: No Urine stream starts and stops?: No Trouble starting stream?: No Do you have to strain to urinate?: No Blood in urine?: No Urinary tract infection?: No Sexually transmitted disease?: No Injury to kidneys or bladder?: No Painful intercourse?: No Weak stream?: No Currently pregnant?: No Vaginal bleeding?: No Last menstrual period?: n  Gastrointestinal Nausea?: No Vomiting?: No Indigestion/heartburn?: No Diarrhea?: No Constipation?: No  Constitutional Fever: No Night sweats?: Yes Weight loss?: No Fatigue?: No  Skin Skin rash/lesions?: No Itching?: No  Eyes Blurred vision?: No Double vision?: No  Ears/Nose/Throat Sore throat?: No Sinus problems?: Yes  Hematologic/Lymphatic Swollen glands?: No Easy bruising?: No  Cardiovascular Leg swelling?: No Chest pain?: No  Respiratory Cough?: No Shortness of breath?: No  Endocrine Excessive thirst?: No  Musculoskeletal Back pain?: No Joint pain?: Yes  Neurological Headaches?: No Dizziness?: No  Psychologic Depression?: No Anxiety?: No  Physical Exam: BP (!) 156/70   Pulse 80   Ht 4\' 11"  (1.499 m)   Wt 149 lb (67.6 kg)   BMI 30.09 kg/m   Constitutional: Well nourished.  Alert and oriented, No acute distress. HEENT: [place] AT, moist mucus membranes. Trachea midline, no masses. Cardiovascular: No clubbing, cyanosis, or edema. Respiratory: Normal respiratory effort, no increased work of breathing. GI: Abdomen is soft, non tender, non distended, no abdominal masses. Liver and spleen not palpable.  No hernias appreciated.  Stool sample for occult testing is not indicated.   GU: No CVA tenderness.  No bladder fullness or masses.  Atrophic external genitalia, normal pubic hair distribution, no lesions.  Normal urethral meatus, no lesions, no prolapse, no discharge.   No urethral masses, tenderness and/or tenderness. No bladder fullness, tenderness or masses. Pale vagina mucosa, good estrogen effect, no discharge, no lesions, good pelvic support, Grade II cystocele noted.  No rectocele noted.  No cervical motion tenderness.  Uterus is freely mobile and non-fixed.  No adnexal/parametria masses or tenderness noted.  Anus and perineum are without rashes or lesions.  Skin: No rashes, bruises or suspicious lesions. Lymph: No cervical or inguinal adenopathy. Neurologic: Grossly intact, no focal deficits, moving all 4 extremities. Psychiatric: Normal mood and affect.  Laboratory Data: Baseline creatinine 0.6 I have reviewed the labs.   Pertinent Imaging: Results for MERISSA, RENWICK (MRN [id number]) as of 09/06/2017 09:29  Ref. Range 09/06/2017 09:18  Scan Result Unknown 35ml   Assessment & Plan:    1.Mixed incontinence  - offered behavioral therapies, bladder training, bladder control strategies and  pelvic floor muscle training - patient still deferred  - fluid management - good water intake  - found the Myrbetriq to 50 mg daily more effective than the Vesicare - will continue the Myrbetriq 50 mg daily  - RTC in one year for PVR and OAB questionnaire  2. Vaginal atrophy  - continue vaginal estrogen cream three nights weekly  - She will follow up in one year for an  exam.     Return in about 1 year (around 09/06/2018) for OAB questionnaire, PVR and exam.  These notes generated with voice recognition software. I apologize for typographical errors.  Michiel Cowboy, PA-C  [facility] [address] [place], [place] [address] [phone]

## 2017-10-16 ENCOUNTER — Telehealth: Payer: Self-pay

## 2017-10-16 NOTE — Telephone Encounter (Signed)
PA completed for myrbetriq. PA response was "medication available without a PA needed".

## 2018-04-01 DIAGNOSIS — N3946 Mixed incontinence: Secondary | ICD-10-CM | POA: Insufficient documentation

## 2018-04-01 DIAGNOSIS — D649 Anemia, unspecified: Secondary | ICD-10-CM | POA: Diagnosis not present

## 2018-04-01 DIAGNOSIS — J302 Other seasonal allergic rhinitis: Secondary | ICD-10-CM | POA: Diagnosis not present

## 2018-04-01 DIAGNOSIS — Z79899 Other long term (current) drug therapy: Secondary | ICD-10-CM | POA: Diagnosis not present

## 2018-04-01 DIAGNOSIS — I10 Essential (primary) hypertension: Secondary | ICD-10-CM | POA: Diagnosis not present

## 2018-06-24 DIAGNOSIS — J069 Acute upper respiratory infection, unspecified: Secondary | ICD-10-CM | POA: Diagnosis not present

## 2018-06-24 DIAGNOSIS — D649 Anemia, unspecified: Secondary | ICD-10-CM | POA: Diagnosis not present

## 2018-06-24 DIAGNOSIS — N3946 Mixed incontinence: Secondary | ICD-10-CM | POA: Diagnosis not present

## 2018-06-24 DIAGNOSIS — J302 Other seasonal allergic rhinitis: Secondary | ICD-10-CM | POA: Diagnosis not present

## 2018-06-24 DIAGNOSIS — I10 Essential (primary) hypertension: Secondary | ICD-10-CM | POA: Diagnosis not present

## 2018-06-24 DIAGNOSIS — Z79899 Other long term (current) drug therapy: Secondary | ICD-10-CM | POA: Diagnosis not present

## 2018-07-01 ENCOUNTER — Other Ambulatory Visit: Payer: Self-pay | Admitting: Obstetrics and Gynecology

## 2018-07-01 DIAGNOSIS — Z01419 Encounter for gynecological examination (general) (routine) without abnormal findings: Secondary | ICD-10-CM | POA: Diagnosis not present

## 2018-07-01 DIAGNOSIS — Z1231 Encounter for screening mammogram for malignant neoplasm of breast: Secondary | ICD-10-CM | POA: Diagnosis not present

## 2018-07-01 DIAGNOSIS — Z1211 Encounter for screening for malignant neoplasm of colon: Secondary | ICD-10-CM | POA: Diagnosis not present

## 2018-07-01 DIAGNOSIS — E669 Obesity, unspecified: Secondary | ICD-10-CM | POA: Diagnosis not present

## 2018-07-17 DIAGNOSIS — Z1211 Encounter for screening for malignant neoplasm of colon: Secondary | ICD-10-CM | POA: Diagnosis not present

## 2018-08-05 ENCOUNTER — Ambulatory Visit
Admission: RE | Admit: 2018-08-05 | Discharge: 2018-08-05 | Disposition: A | Discharge status: Home / Self Care | Payer: PPO | Source: Ambulatory Visit | Attending: Obstetrics and Gynecology | Admitting: Obstetrics and Gynecology

## 2018-08-05 DIAGNOSIS — Z1231 Encounter for screening mammogram for malignant neoplasm of breast: Secondary | ICD-10-CM | POA: Insufficient documentation

## 2018-08-21 ENCOUNTER — Ambulatory Visit: Payer: PPO | Admitting: Urology

## 2018-08-24 NOTE — Progress Notes (Signed)
08/25/2018 11:18 AM   Abigail Malone October 16, 1950 [id number]  Referring provider: Cheryll Dessert, FNP No address on file  Chief Complaint  Patient presents with  . Follow-up    HPI: 68 yo AAF with mixed urinary incontinence and vaginal atrophy who presents today for a one year follow up.    Mixed urinary incontinence Referred by, Dalbert Batman, CNM, for urinary incontinence.  Incontinence for x 2 years.  Patient has incontinence with urge and stress.   Her risk factors for incontinence are obesity, vaginal delivery, a family history of incontinence, age, caffeine and vaginal atrophy.  She is taking ACE inhibitors.      Today, the patient has been experiencing urgency x 0-3 (stable), frequency x 4-7 (stable), not restricting fluids to avoid visits to the restroom, not engaging in toilet mapping, incontinence x 0-3 (stable) and nocturia x 0-3 (stable).   Her BP is 143/67.  Her PVR is 0 mL.  She has not taken her Myrbetriq in almost a year.  She has not noted anything different.  She has tried to decrease her caffeine intake.  She is going through 3 POISE PADS #4 daily.  She notes that the pads are not always wet.  She does admit that the pads are not that wet.    Patient denies any gross hematuria, dysuria or suprapubic/flank pain.  Patient denies any fevers, chills, nausea or vomiting.    She has noted diarrhea when she urinates.  This has been occurring for several weeks.    She brings in a supplement of "Better Bladder" that she saw in the newspaper.  Its ingredients consist of Vitamin D3, lindera, horsetail and caveta   She states she has not taken any of the supplement at this time.   Vaginal atrophy She is not using the vaginal estrogen cream.    PMH: Past Medical History:  Diagnosis Date  . Abnormal uterine bleeding (AUB) 05/15/2016  . Allergic rhinitis, seasonal 12/09/2014  . Anemia, unspecified 11/18/2013  . HTN (hypertension) 11/18/2013  . Hypertension   .  Menopausal symptoms 08/05/2014  . Right knee pain 07/26/2015    Surgical History: Past Surgical History:  Procedure Laterality Date  . COLON SURGERY      Home Medications:  Allergies as of 08/25/2018      Reactions   Amlodipine    Other reaction(s): Other (See Comments) Leg cramping      Medication List       Accurate as of August 25, 2018 11:18 AM. Always use your most recent med list.        amLODipine 5 MG tablet Commonly known as:  NORVASC Take 5 mg by mouth daily. Pt has not been taken from past 2 week giving tingling and cramping so stop taking it.   conjugated estrogens vaginal cream Commonly known as:  PREMARIN Apply 0.5mg  (pea-sized amount)  just inside the vaginal introitus with a finger-tip on  Monday, Wednesday and Friday nights.   estradiol 0.1 MG/GM vaginal cream Commonly known as:  ESTRACE VAGINAL Apply 0.5mg  (pea-sized amount)  just inside the vaginal introitus with a finger-tip on Monday, Wednesday and Friday nights.   lisinopril-hydrochlorothiazide 20-12.5 MG tablet Commonly known as:  PRINZIDE,ZESTORETIC Take by mouth.   mirabegron ER 50 MG Tb24 tablet Commonly known as:  MYRBETRIQ Take 1 tablet (50 mg total) by mouth daily.       Allergies:  Allergies  Allergen Reactions  . Amlodipine     Other reaction(s):  Other (See Comments) Leg cramping    Family History: Family History  Problem Relation Age of Onset  . Hematuria Mother   . Liver cancer Mother   . Prostate cancer Father   . Kidney failure Brother   . Breast cancer Neg Hx     Social History:  reports that she has quit smoking. She has never used smokeless tobacco. She reports current alcohol use. She reports that she does not use drugs.  ROS: UROLOGY Frequent Urination?: No Hard to postpone urination?: No Burning/pain with urination?: No Get up at night to urinate?: No Leakage of urine?: No Urine stream starts and stops?: No Trouble starting stream?: No Do you have to  strain to urinate?: No Blood in urine?: No Urinary tract infection?: No Sexually transmitted disease?: No Injury to kidneys or bladder?: No Painful intercourse?: No Weak stream?: No Currently pregnant?: No Vaginal bleeding?: No Last menstrual period?: n  Gastrointestinal Nausea?: No Vomiting?: No Indigestion/heartburn?: Yes Diarrhea?: Yes Constipation?: No  Constitutional Fever: No Night sweats?: Yes Weight loss?: No Fatigue?: Yes  Skin Skin rash/lesions?: No Itching?: Yes  Eyes Blurred vision?: No Double vision?: No  Ears/Nose/Throat Sore throat?: Yes Sinus problems?: Yes  Hematologic/Lymphatic Swollen glands?: No Easy bruising?: No  Cardiovascular Leg swelling?: Yes Chest pain?: No  Respiratory Cough?: Yes Shortness of breath?: No  Endocrine Excessive thirst?: No  Musculoskeletal Back pain?: Yes Joint pain?: Yes  Neurological Headaches?: Yes Dizziness?: No  Psychologic Depression?: Yes Anxiety?: No  Physical Exam: BP (!) 143/67   Pulse 93   Ht 4\' 11"  (1.499 m)   Wt 156 lb (70.8 kg)   BMI 31.51 kg/m   Constitutional:  Well nourished. Alert and oriented, No acute distress. HEENT: [place] AT, moist mucus membranes.  Trachea midline, no masses. Cardiovascular: No clubbing, cyanosis, or edema. Respiratory: Normal respiratory effort, no increased work of breathing. GI: Abdomen is soft, non tender, non distended, no abdominal masses. Liver and spleen not palpable.  No hernias appreciated.  Stool sample for occult testing is not indicated.   GU: No CVA tenderness.  No bladder fullness or masses.  Atrophic external genitalia, normal pubic hair distribution, no lesions.  Normal urethral meatus, no lesions, no prolapse, no discharge.   No urethral masses, tenderness and/or tenderness. No bladder fullness, tenderness or masses. Pale vagina mucosa, poor estrogen effect, no discharge, no lesions, poor pelvic support, grade II cystocele and rectocele noted.   No cervical motion tenderness.  Uterus is freely mobile and non-fixed.  No adnexal/parametria masses or tenderness noted.  Anus and perineum are without rashes or lesions.    Skin: No rashes, bruises or suspicious lesions. Lymph: No cervical or inguinal adenopathy. Neurologic: Grossly intact, no focal deficits, moving all 4 extremities. Psychiatric: Normal mood and affect.   Laboratory Data: Baseline creatinine 0.6 I have reviewed the labs.   Pertinent Imaging: Results for Abigail, Malone (MRN [id number]) as of 08/25/2018 10:46  Ref. Range 08/25/2018 10:40  Scan Result Unknown 0    Assessment & Plan:    1.Mixed incontinence - advised the patient that taking horsetail for long periods of time can be toxic and that it basically looks to me like an expensive way to get your Vitamin D as there are medical studies on those herbs  - offered behavioral therapies, bladder training, bladder control strategies and  pelvic floor muscle training - patient would like a referral to PT - fluid management - good water intake - did not continue the Myrbetriq as  she found no change in her symptoms without the medication  2. Vaginal atrophy  - restart vaginal estrogen cream three nights weekly  - She will follow up in one year for an exam.    3. Pelvic floor weakness - associated with cystocele and rectocele - will refer to PT for further evaluation and strengthening as she would like to avoid surgery  4. Fecal incontinence - ? Result of rectocele - will reassess once completes PT    Return for after PT .  These notes generated with voice recognition software. I apologize for typographical errors.  Michiel CowboySHANNON Krystal Teachey, PA-C  [facility] [address] [place], [place] [id number] [phone]

## 2018-08-25 ENCOUNTER — Encounter: Payer: Self-pay | Admitting: Urology

## 2018-08-25 ENCOUNTER — Ambulatory Visit (INDEPENDENT_AMBULATORY_CARE_PROVIDER_SITE_OTHER): Payer: PPO | Admitting: Urology

## 2018-08-25 VITALS — BP 143/67 | HR 93 | Ht 59.0 in | Wt 156.0 lb

## 2018-08-25 DIAGNOSIS — N952 Postmenopausal atrophic vaginitis: Secondary | ICD-10-CM

## 2018-08-25 DIAGNOSIS — N3946 Mixed incontinence: Secondary | ICD-10-CM | POA: Diagnosis not present

## 2018-08-25 DIAGNOSIS — N8111 Cystocele, midline: Secondary | ICD-10-CM

## 2018-08-25 LAB — BLADDER SCAN AMB NON-IMAGING: Scan Result: 0

## 2018-08-25 MED ORDER — ESTROGENS, CONJUGATED 0.625 MG/GM VA CREA: TOPICAL_CREAM | VAGINAL | 12 refills | Status: DC

## 2018-08-25 NOTE — Patient Instructions (Signed)
I have sent prescription for a vaginal estrogen cream, Premarin.  If it is too expensive, please call the office at [phone] for an alternative.  You are given a sample of vaginal estrogen cream Premarin and instructed to apply 0.5mg  (pea-sized amount)  just inside the vaginal introitus with a finger-tip on Monday, Wednesday and Friday nights,

## 2018-10-10 DIAGNOSIS — H2513 Age-related nuclear cataract, bilateral: Secondary | ICD-10-CM | POA: Diagnosis not present

## 2018-12-22 ENCOUNTER — Other Ambulatory Visit: Payer: Self-pay

## 2018-12-22 ENCOUNTER — Ambulatory Visit: Payer: PPO | Attending: Urology

## 2018-12-22 DIAGNOSIS — M62838 Other muscle spasm: Secondary | ICD-10-CM | POA: Diagnosis not present

## 2018-12-22 DIAGNOSIS — R278 Other lack of coordination: Secondary | ICD-10-CM

## 2018-12-22 DIAGNOSIS — R293 Abnormal posture: Secondary | ICD-10-CM | POA: Insufficient documentation

## 2018-12-22 NOTE — Therapy (Signed)
[place] [facility] [address] [place], [place], [address] Phone: [phone]   Fax:  [phone]  Physical Therapy Evaluation  Patient Details  Name: Abigail Malone MRN: [id number] Date of Birth: [birth year] Referring Provider (PT): Michiel CowboyMcGowan, Shannon   Encounter Date: 12/22/2018  PT End of Session - 12/23/18 0805    Visit Number  1    Number of Visits  10    Date for PT Re-Evaluation  03/02/19    Authorization - Visit Number  1    Authorization - Number of Visits  10    PT Start Time  1115    PT Stop Time  1215    PT Time Calculation (min)  60 min    Activity Tolerance  Patient tolerated treatment well;No increased pain    Behavior During Therapy  WFL for tasks assessed/performed       Past Medical History:  Diagnosis Date  . Abnormal uterine bleeding (AUB) 05/15/2016  . Allergic rhinitis, seasonal 12/09/2014  . Anemia, unspecified 11/18/2013  . HTN (hypertension) 11/18/2013  . Hypertension   . Menopausal symptoms 08/05/2014  . Right knee pain 07/26/2015    Past Surgical History:  Procedure Laterality Date  . COLONOSCOPY  ~2018    There were no vitals filed for this visit.    Pelvic Floor Physical Therapy Evaluation and Assessment  SCREENING  Falls in last 6 mo: no    Patient's communication preference:   Red Flags:  Have you had any night sweats? no Unexplained weight loss? no Saddle anesthesia? no Unexplained changes in bowel or bladder habits? no  SUBJECTIVE  Patient reports: Has had issue for ~ 2-3 years. Has to run to the bathroom to prevent UI, has decreased fluid intake but still having UI, just less volume.  Has LBP that is exacerbated by bending/reachig (cleaning floorboards) that "catches" then eases after she has been up for a little while. Sleeps on L side, if she sleeps on her R she feels "weak" it hrts to lay on her back in a bed.  Precautions:  none  Social/Family/Vocational History:    Retired, prior Psychologist, occupationalwelder.  Recent Procedures/Tests/Findings:  none  Obstetrical History: 2, both vaginal, last one had tearing.  Gynecological History: none  Urinary History: Has to run to the bathroom, was urinating every 1.5-2 hours before cutting back H2O, is still going about as often. Is still having leakage, just not as much. Wears a pad. Has cut back drinking water to try to stop having UI.   Gastrointestinal History: colonoscopy  Sexual activity/pain: none  Location of pain: LBP Current pain:  0/10  Max pain:  6/10 Least pain:  0/10 Nature of pain: dull ache  Patient Goals: To not have to wear a pad.   OBJECTIVE  Posture/Observations:  Sitting: anterior pelvic tilt. Standing: anterior pelvic tilt  Palpation/Segmental Motion/Joint Play: TTP to B adductors   Special tests:   Supine-to-long sit: RLE long in both,  Leg-length: 87 on R, 88 on L.  (L up-slip and ~ 1 cm long)  Range of Motion/Flexibilty:  Spine: decreased lumbar flexion mobility, Fingers ~ 2 in. From floor but most of the mobility is from HS. Very little rotation B. ~ 2 fingers from knee to R, ~3 fingers on L with SB pain on R with B SB. Hips: decreased hip extension.   Strength/MMT: deferred to follow-up LE MMT  LE MMT Left Right  Hip flex:  (L2) /5 /5  Hip  ext: /5 /5  Hip abd: /5 /5  Hip add: /5 /5  Hip IR /5 /5  Hip ER /5 /5     Abdominal:  Palpation: TTP to B hip flexors Diastasis: none visualized, not palpated.  Pelvic Floor External Exam: Deferred due to covid-19 precaution. Introitus Appears:  Skin integrity:  Palpation: Cough: Prolapse visible?: Scar mobility:  Internal Vaginal Exam: Strength (PERF):  Symmetry: Palpation: Prolapse:   Internal Rectal Exam: Strength (PERF): Symmetry: Palpation: Prolapse:   Gait Analysis: deferred to next visit.   Pelvic Floor Outcome Measures: UIQ: 19/100, PFDI: 56/300  INTERVENTIONS THIS SESSION: Self-care:  Educated on the structure and function of the pelvic floor in relation to their symptoms as well as the POC, and initial HEP in order to set patient expectations and understanding from which we will build on in the future sessions. Educated on urge-supression technique to begin to decrease urge incontinence and seated posture to decrease LBP and pressure on lumbosacral nerves to allow for improved PFM function and decreased SUI.   Total time: 60 min.      [facility] PT Assessment - 12/23/18 0001      Assessment   Medical Diagnosis  cystocele    Referring Provider (PT)  Michiel Cowboy    Onset Date/Surgical Date  12/23/15    Prior Therapy  none      Precautions   Precautions  None      Restrictions   Weight Bearing Restrictions  No      Balance Screen   Has the patient fallen in the past 6 months  No      Home Environment   Living Environment  Private residence    Living Arrangements  Spouse/significant other    Type of Home  House    Home Access  Stairs to enter    Entrance Stairs-Number of Steps  2    Entrance Stairs-Rails  Right    Home Layout  One level      Prior Function   Level of Independence  Independent    Vocation  Retired    Engineer, materials    Leisure  [place], travelling      Cognition   Overall Cognitive Status  Within Functional Limits for tasks assessed                Objective measurements completed on examination: See above findings.                PT Short Term Goals - 12/23/18 1028      PT SHORT TERM GOAL #1   Title  Patient will demonstrate appropriate application of urge-suppression technique to re-train neurological pathway and decrease urge incontinence for improved QOL.    Baseline  Pt. having to wear thick pads daily and restriicting activity engagement due to frequent UUI     Time  5    Period  Weeks    Status  New    Target Date  01/26/19      PT SHORT TERM GOAL #2   Title   Patient will demonstrate improved pelvic alignment and balance of musculature surrounding the pelvis to facilitate decreased PFM spasms and decrease pelvic pain.    Baseline  Pt. demonstrates anterior pelvic tilt, LLE long and up-slipped and spasms surrounding pelvis.     Time  5    Period  Weeks    Status  New    Target Date  01/26/19  PT SHORT TERM GOAL #3   Title  Patient will report a reduction in pain to no greater than 3/10 over the prior week to demonstrate symptom improvement.    Baseline  6/10 at worst    Time  5    Period  Weeks    Status  New    Target Date  01/26/19        PT Long Term Goals - 12/23/18 1034      PT LONG TERM GOAL #1   Title  Patient will report no episodes of SUIor UUI over the course of the prior two weeks to demonstrate improved functional ability.    Baseline  Pt. wearing heavy pads daily and restricting fluid intake due to significant urinary incontinence.    Time  10    Period  Weeks    Status  New    Target Date  03/02/19      PT LONG TERM GOAL #2   Title  Patient will score at or below 11/300 on the PFDI and 10/100 on the UIQ to demonstrate a clinically meaningful decrease in disability and distress due to pelvic floor dysfunction.    Baseline  PFDI: 56/300, UIQ: 19/100    Time  10    Period  Weeks    Status  New    Target Date  03/02/19      PT LONG TERM GOAL #3   Title  Patient will describe pain no greater than 1/10 during housekeeping, picking strawberries, playing with grandchildren to demonstrate improved functional ability.    Baseline  Pain 6/10 at worst, aggrivated with lying flat in bed, standing from being bent forward    Time  10    Period  Weeks    Status  New    Target Date  03/02/19             Plan - 12/23/18 0830    Clinical Impression Statement  Pt. is a 68 y/o female who presents today with cheif c/o mixed UI and LBP. Her relevant PMH includes R knee pain that is exacerbated with long-distance walking and  2 vaginal deliveries, the second with perineal tearing. Her clinical exam reveals hyperlordosis, a leg-length discrepancy with the LLE long and up-slipped as well as spasms surrounding the pelvis and decreased spinal mobility, especially in rotation B. Pt. History suggests poor PFM coordination and imbalance due to pelvic malalignment, to be confirmed with internal exam if/when deemed necessary for care. Pt. will benefit from skilled pelvic health PT to address the noted defecits and to continue to assess for and address other potential causes of symptoms as neccessary.      Personal Factors and Comorbidities  Age    Examination-Activity Limitations  Continence    Examination-Participation Restrictions  Church;Shop;Community Activity;Interpersonal Relationship;Cleaning    Stability/Clinical Decision Making  Evolving/Moderate complexity    Clinical Decision Making  Moderate    Rehab Potential  Good    PT Frequency  1x / week    PT Duration  --   10 weeks   PT Treatment/Interventions  ADLs/Self Care Home Management;Electrical Stimulation;Moist Heat;Traction;Functional mobility training;Gait training;Therapeutic activities;Therapeutic exercise;Neuromuscular re-education;Manual techniques;Patient/family education;Dry needling;Taping;Spinal Manipulations;Joint Manipulations    PT Next Visit Plan  TP release surrounding pelvis, L up-slip correction, give heel-lift and assess/re-assess gait, review urge-suppression.    PT Home Exercise Plan  urge-suppression, seated posture.    Consulted and Agree with Plan of Care  Patient       Patient will  benefit from skilled therapeutic intervention in order to improve the following deficits and impairments:  Increased fascial restricitons, Improper body mechanics, Pain, Decreased coordination, Decreased scar mobility, Increased muscle spasms, Postural dysfunction, Decreased activity tolerance, Difficulty walking, Impaired flexibility  Visit Diagnosis: Other lack  of coordination  Other muscle spasm  Abnormal posture     Problem List Patient Active Problem List   Diagnosis Date Noted  . Mixed stress and urge urinary incontinence 04/01/2018  . Abnormal uterine bleeding (AUB) 05/15/2016  . Right knee pain 07/26/2015  . Allergic rhinitis, seasonal 12/09/2014  . Menopausal symptoms 08/05/2014  . HTN (hypertension) 11/18/2013  . Anemia, unspecified 11/18/2013   Cleophus Molt DPT, ATC Cleophus Molt 12/23/2018, 10:50 AM  [facility] MAIN [facility] SERVICES [address] [place], [place], [address] Phone: [phone]   Fax:  [phone]  Name: Abigail Malone MRN: [id number] Date of Birth: 1951/01/18

## 2018-12-22 NOTE — Patient Instructions (Addendum)
Urge supression technique:  1) Take a deep breath to convince yourself that you are in control and calm the nervous system.  2) Do 5 "quick-flick" kegels (pelvic floor muscle contractions) and re-assess the urge. Repeat another set if urge is still present. 3) Once the urge has decreased, start walking calmly to the the bathroom. Stop and repeat steps 1 and 2 as many times as needed until you can successfully get to the toilet. 4) Only once seated, take a deep breath and allow the pelvic floor muscles to relax and allow for the urine to flow.    Do not be discouraged if you are not successful the first couple times, this is normal and it will take practice but remember that YOU are in control. Start by practicing this at home where you do not have to worry as much if there were to be an accident. Allowing yourself to get rushed or nervous puts the bladder back in control and will not allow the technique to work.  Go back to drinking your 64 oz. Of water per day.    When seated, you want to maintain pelvic neutral with the shoulders gently down and back and ears in line with your shoulders. A lumbar roll such as the one below or a home-made towel-roll can be used for this purpose. Even Olympic athletes can only maintain proper seated posture for about 10 minutes without support!    Pictured: The Original [employer] Lumbar Roll

## 2018-12-29 ENCOUNTER — Ambulatory Visit: Payer: PPO

## 2018-12-29 ENCOUNTER — Other Ambulatory Visit: Payer: Self-pay

## 2018-12-29 DIAGNOSIS — R278 Other lack of coordination: Secondary | ICD-10-CM | POA: Diagnosis not present

## 2018-12-29 DIAGNOSIS — M62838 Other muscle spasm: Secondary | ICD-10-CM

## 2018-12-29 DIAGNOSIS — R293 Abnormal posture: Secondary | ICD-10-CM

## 2018-12-29 NOTE — Patient Instructions (Signed)
(   you can do this sitting in a chair, you do not have to sit on the floor)  Hold for 30 seconds (5 deep breaths) and repeat 2-3 times on each side once a day    Relax all the way forward, tucking your hips under just slightly so you feel a stretch through your low back. Hold for 5 deep breaths, repeat 2-3 times, 1-2 times per day.    Do 2 sets of 15 tilts per day. Breathe in when you tilt forward (A) and out when you tuck under (B).  *see if you can feel the pelvic floor muscles squeeze when you tuck under and relax when you stick your bottom back.

## 2018-12-29 NOTE — Therapy (Signed)
[place] MAIN [facility] SERVICES [address] [place], [place], [address] Phone: [phone]   Fax:  [phone]  Physical Therapy Treatment  The patient has been informed of current processes in place at Outpatient Rehab to protect patients from Covid-19 exposure including social distancing, schedule modifications, and new cleaning procedures. After discussing their particular risk with a therapist based on the patient's personal risk factors, the patient has decided to proceed with in-person therapy.   Patient Details  Name: Abigail Malone MRN: [id number] Date of Birth: November 02, 1950 Referring Provider (PT): Zara Council   Encounter Date: 12/29/2018  PT End of Session - 12/31/18 1250    Visit Number  2    Number of Visits  10    Date for PT Re-Evaluation  03/02/19    Authorization - Visit Number  2    Authorization - Number of Visits  10    PT Start Time  4163    PT Stop Time  8453    PT Time Calculation (min)  60 min    Activity Tolerance  Patient tolerated treatment well;No increased pain    Behavior During Therapy  WFL for tasks assessed/performed       Past Medical History:  Diagnosis Date  . Abnormal uterine bleeding (AUB) 05/15/2016  . Allergic rhinitis, seasonal 12/09/2014  . Anemia, unspecified 11/18/2013  . HTN (hypertension) 11/18/2013  . Hypertension   . Menopausal symptoms 08/05/2014  . Right knee pain 07/26/2015    Past Surgical History:  Procedure Laterality Date  . COLONOSCOPY  ~2018    There were no vitals filed for this visit.   Pelvic Floor Physical Therapy Treatment Note  SCREENING  Changes in medications, allergies, or medical history?: no   SUBJECTIVE  Patient reports: Is doing her urge suppression first thing in the morning and as many times as she can remember throughout the day. Is not seeing it helping yet.  Precautions:  none  Pain update: Location of pain: LBP Current pain: 0/10  Max  pain: 6/10 Least pain: 0/10 Nature of pain:dull ache  Patient Goals: To not have to wear a pad.   OBJECTIVE  Changes in: Posture/Observations:  L LE short at start of session. After up-slip correction, LLE long in lying, short in sitting. After MET, L slightly short in sitting  Range of Motion/Flexibilty:  Decreased lumbar mobility   Abdominal:  Able to find TA with cueing but only weak recruitment noted.  Palpation: TTP to  L QL, B Psoas and R Pectineus/Adductor brevis. Pain and decreased mobility along B borders of the sacrum.   INTERVENTIONS THIS SESSION: Manual: Performed TP release to L QL, B Psoas and R Pectineus/Adductor brevis followed by PA mobs to the sacrum on B borders and MET correction for L anterior rotation and up-slip correction for L up-slip to improve pelvic alignment and take pressure off of nerve roots to allow for improved signal to the PFM for decreased spasm and improved function. Therex: Educated on and practiced side-stretch, low back stretch, and pelvic tilts in seated to improve pelvic alignment and improve balance of musculature and coordination of muscles acting within and upon the pelvis for optimal function. Self-care: Reviewed urge-suppression technique for improved performance and efficacy and how to find pelvic neutral to allow for improved recruitment and function of PFM for decreased incontinence.  Total time: 70 min.  PT Short Term Goals - 12/23/18 1028      PT SHORT TERM GOAL #1   Title  Patient will demonstrate appropriate application of urge-suppression technique to re-train neurological pathway and decrease urge incontinence for improved QOL.    Baseline  Pt. having to wear thick pads daily and restriicting activity engagement due to frequent UUI     Time  5    Period  Weeks    Status  New    Target Date  01/26/19      PT SHORT TERM GOAL #2   Title  Patient will demonstrate  improved pelvic alignment and balance of musculature surrounding the pelvis to facilitate decreased PFM spasms and decrease pelvic pain.    Baseline  Pt. demonstrates anterior pelvic tilt, LLE long and up-slipped and spasms surrounding pelvis.     Time  5    Period  Weeks    Status  New    Target Date  01/26/19      PT SHORT TERM GOAL #3   Title  Patient will report a reduction in pain to no greater than 3/10 over the prior week to demonstrate symptom improvement.    Baseline  6/10 at worst    Time  5    Period  Weeks    Status  New    Target Date  01/26/19        PT Long Term Goals - 12/23/18 1034      PT LONG TERM GOAL #1   Title  Patient will report no episodes of SUIor UUI over the course of the prior two weeks to demonstrate improved functional ability.    Baseline  Pt. wearing heavy pads daily and restricting fluid intake due to significant urinary incontinence.    Time  10    Period  Weeks    Status  New    Target Date  03/02/19      PT LONG TERM GOAL #2   Title  Patient will score at or below 11/300 on the PFDI and 10/100 on the UIQ to demonstrate a clinically meaningful decrease in disability and distress due to pelvic floor dysfunction.    Baseline  PFDI: 56/300, UIQ: 19/100    Time  10    Period  Weeks    Status  New    Target Date  03/02/19      PT LONG TERM GOAL #3   Title  Patient will describe pain no greater than 1/10 during housekeeping, picking strawberries, playing with grandchildren to demonstrate improved functional ability.    Baseline  Pain 6/10 at worst, aggrivated with lying flat in bed, standing from being bent forward    Time  10    Period  Weeks    Status  New    Target Date  03/02/19            Plan - 12/31/18 1251    Clinical Impression Statement  Pt. responded well to all interventions today, demonstrating decreased spasm and improved pelvic alignment as well as understanding of all education provided. Continue per POC.    PT Next  Visit Plan  re-assess and TP release surrounding pelvis PRN, and assess/re-assess gait,     PT Home Exercise Plan  urge-suppression, seated posture, side-stretch, low back stretch, and seated pelvic tilts.    Consulted and Agree with Plan of Care  Patient       Patient will benefit from skilled therapeutic intervention in order to improve the  following deficits and impairments:     Visit Diagnosis: Other lack of coordination  Other muscle spasm  Abnormal posture     Problem List Patient Active Problem List   Diagnosis Date Noted  . Mixed stress and urge urinary incontinence 04/01/2018  . Abnormal uterine bleeding (AUB) 05/15/2016  . Right knee pain 07/26/2015  . Allergic rhinitis, seasonal 12/09/2014  . Menopausal symptoms 08/05/2014  . HTN (hypertension) 11/18/2013  . Anemia, unspecified 11/18/2013   Willa Rough DPT, ATC Willa Rough 12/31/2018, 1:04 PM  [place] MAIN [facility] SERVICES [address] [place], [place], [address] Phone: [phone]   Fax:  [phone]  Name: Bronwen Pendergraft MRN: [id number] Date of Birth: [birth year]

## 2018-12-31 DIAGNOSIS — Z0000 Encounter for general adult medical examination without abnormal findings: Secondary | ICD-10-CM | POA: Diagnosis not present

## 2018-12-31 DIAGNOSIS — D649 Anemia, unspecified: Secondary | ICD-10-CM | POA: Diagnosis not present

## 2018-12-31 DIAGNOSIS — I10 Essential (primary) hypertension: Secondary | ICD-10-CM | POA: Diagnosis not present

## 2018-12-31 DIAGNOSIS — Z79899 Other long term (current) drug therapy: Secondary | ICD-10-CM | POA: Diagnosis not present

## 2018-12-31 DIAGNOSIS — N3946 Mixed incontinence: Secondary | ICD-10-CM | POA: Diagnosis not present

## 2018-12-31 DIAGNOSIS — J302 Other seasonal allergic rhinitis: Secondary | ICD-10-CM | POA: Diagnosis not present

## 2019-01-05 ENCOUNTER — Ambulatory Visit: Payer: PPO

## 2019-01-12 ENCOUNTER — Ambulatory Visit: Payer: PPO

## 2019-01-19 ENCOUNTER — Other Ambulatory Visit: Payer: Self-pay

## 2019-01-19 ENCOUNTER — Ambulatory Visit: Payer: PPO

## 2019-01-19 DIAGNOSIS — R293 Abnormal posture: Secondary | ICD-10-CM

## 2019-01-19 DIAGNOSIS — R278 Other lack of coordination: Secondary | ICD-10-CM | POA: Diagnosis not present

## 2019-01-19 DIAGNOSIS — M62838 Other muscle spasm: Secondary | ICD-10-CM

## 2019-01-19 NOTE — Therapy (Signed)
[place] MAIN [facility] SERVICES [address] [place], [place], [address] Phone: [phone]   Fax:  [phone]  Physical Therapy Treatment   The patient has been informed of current processes in place at Outpatient Rehab to protect patients from Covid-19 exposure including social distancing, schedule modifications, and new cleaning procedures. After discussing their particular risk with a therapist based on the patient's personal risk factors, the patient has decided to proceed with in-person therapy.  Patient Details  Name: Abigail Malone MRN: [id number] Date of Birth: Apr 30, 1951 Referring Provider (PT): Zara Council   Encounter Date: 01/19/2019  PT End of Session - 01/19/19 1634    Visit Number  3    Number of Visits  10    Date for PT Re-Evaluation  03/02/19    Authorization - Visit Number  3    Authorization - Number of Visits  10    PT Start Time  1962    PT Stop Time  2297    PT Time Calculation (min)  60 min    Activity Tolerance  Patient tolerated treatment well;No increased pain    Behavior During Therapy  WFL for tasks assessed/performed       Past Medical History:  Diagnosis Date  . Abnormal uterine bleeding (AUB) 05/15/2016  . Allergic rhinitis, seasonal 12/09/2014  . Anemia, unspecified 11/18/2013  . HTN (hypertension) 11/18/2013  . Hypertension   . Menopausal symptoms 08/05/2014  . Right knee pain 07/26/2015    Past Surgical History:  Procedure Laterality Date  . COLONOSCOPY  ~2018    There were no vitals filed for this visit.   Pelvic Floor Physical Therapy Treatment Note  SCREENING  Changes in medications, allergies, or medical history?: none    SUBJECTIVE  Patient reports: Started using cream during the day and this seems to be better. Seems to do ok when sitting, but is having leakage when standing after prolonged sitting. Is using Ibuprofen to manage back pain. Has been doing a lot of  bending/lifting/cleaning around the house   Precautions:  none  Pain update:  Location of pain: LBP Current pain:  5/10  Max pain:  7/10 Least pain:  2/10 Nature of pain: dull ache  Patient Goals: To not have to wear a pad.   OBJECTIVE  Changes in: Posture/Observations:  hyperlordotic  Range of Motion/Flexibilty:  Decreased mobility and pain at L3-sacrum and sensation of urinary urge with PA to sacrum  Palpation: TTP to B lumbar multifidus and paraspinals at L3-L5  INTERVENTIONS THIS SESSION: Manual: Performed TP release to B lumbar multifidus and paraspinals at L3-L5 as well as performed PA mobs to as borders of sacrum to improve mobility of joint and surrounding connective tissue and decrease pressure on nerve roots for improved conductivity and function of down-stream tissues.  Therex: Educated on and practiced modified happy-baby and supine adductor stretch To maintain and improve muscle length and allow for improved balance of musculature for long-term symptom relief. Self-care: reviewed urge-suppression technique again and reminded the Pt. That it does not work if she is using glutes to squeeze rather than PFM to allow for decreased urge incontinuence.  Total time: 60 min.                             PT Short Term Goals - 12/23/18 1028      PT SHORT TERM GOAL #1   Title  Patient will demonstrate appropriate  application of urge-suppression technique to re-train neurological pathway and decrease urge incontinence for improved QOL.    Baseline  Pt. having to wear thick pads daily and restriicting activity engagement due to frequent UUI     Time  5    Period  Weeks    Status  New    Target Date  01/26/19      PT SHORT TERM GOAL #2   Title  Patient will demonstrate improved pelvic alignment and balance of musculature surrounding the pelvis to facilitate decreased PFM spasms and decrease pelvic pain.    Baseline  Pt. demonstrates anterior  pelvic tilt, LLE long and up-slipped and spasms surrounding pelvis.     Time  5    Period  Weeks    Status  New    Target Date  01/26/19      PT SHORT TERM GOAL #3   Title  Patient will report a reduction in pain to no greater than 3/10 over the prior week to demonstrate symptom improvement.    Baseline  6/10 at worst    Time  5    Period  Weeks    Status  New    Target Date  01/26/19        PT Long Term Goals - 12/23/18 1034      PT LONG TERM GOAL #1   Title  Patient will report no episodes of SUIor UUI over the course of the prior two weeks to demonstrate improved functional ability.    Baseline  Pt. wearing heavy pads daily and restricting fluid intake due to significant urinary incontinence.    Time  10    Period  Weeks    Status  New    Target Date  03/02/19      PT LONG TERM GOAL #2   Title  Patient will score at or below 11/300 on the PFDI and 10/100 on the UIQ to demonstrate a clinically meaningful decrease in disability and distress due to pelvic floor dysfunction.    Baseline  PFDI: 56/300, UIQ: 19/100    Time  10    Period  Weeks    Status  New    Target Date  03/02/19      PT LONG TERM GOAL #3   Title  Patient will describe pain no greater than 1/10 during housekeeping, picking strawberries, playing with grandchildren to demonstrate improved functional ability.    Baseline  Pain 6/10 at worst, aggrivated with lying flat in bed, standing from being bent forward    Time  10    Period  Weeks    Status  New    Target Date  03/02/19            Plan - 01/19/19 1635    Clinical Impression Statement  Pt. Pt. Responded well to all interventions today, demonstrating improved lumbar flexion ROM, decreased low back pain from 5/10 to 0/10 and improved understanding and correct performance of all education and exercises provided today. They will continue to benefit from skilled physical therapy to work toward remaining goals and maximize function as well as decrease  likelihood of symptom increase or recurrence.    PT Next Visit Plan  TA in quad/mini-marches, glute strengthening. TP release to adductors? assess PFM over underwear PRN.    PT Home Exercise Plan  urge-suppression, seated posture, side-stretch, low back stretch, and seated pelvic tilts, supine adductor stretch, happy-baby .    Consulted and Agree with Plan of Care  Patient       Patient will benefit from skilled therapeutic intervention in order to improve the following deficits and impairments:     Visit Diagnosis: 1. Other lack of coordination   2. Other muscle spasm   3. Abnormal posture        Problem List Patient Active Problem List   Diagnosis Date Noted  . Mixed stress and urge urinary incontinence 04/01/2018  . Abnormal uterine bleeding (AUB) 05/15/2016  . Right knee pain 07/26/2015  . Allergic rhinitis, seasonal 12/09/2014  . Menopausal symptoms 08/05/2014  . HTN (hypertension) 11/18/2013  . Anemia, unspecified 11/18/2013   Cleophus MoltKeeli T. Vallerie Hentz DPT, ATC Cleophus MoltKeeli T Mirtie Bastyr 01/19/2019, 4:38 PM  [place] [facility] [address] [place], [place], [address] Phone: [phone]   Fax:  [phone]  Name: Donald Prosehyllis Nora Dart MRN: [id number] Date of Birth: 06/05/1951

## 2019-01-19 NOTE — Patient Instructions (Signed)
  Hold for ~ 5 deep breaths (30 sec.) and repeat 2-3 times, once per day.    Hold for ~ 5 deep breaths (30 sec.) and repeat 2-3 times, once per day.

## 2019-01-26 ENCOUNTER — Ambulatory Visit: Payer: PPO | Attending: Urology

## 2019-01-26 ENCOUNTER — Other Ambulatory Visit: Payer: Self-pay

## 2019-01-26 DIAGNOSIS — R293 Abnormal posture: Secondary | ICD-10-CM | POA: Diagnosis not present

## 2019-01-26 DIAGNOSIS — M62838 Other muscle spasm: Secondary | ICD-10-CM | POA: Insufficient documentation

## 2019-01-26 DIAGNOSIS — R278 Other lack of coordination: Secondary | ICD-10-CM | POA: Diagnosis not present

## 2019-01-26 NOTE — Patient Instructions (Signed)
Pelvic Tilt With Pelvic Floor (Hook-Lying)        Lie with hips and knees bent. Squeeze pelvic floor and flatten low back while breathing out so that pelvis tilts. Repeat _15x2__ times. Do _1-2__ times a day.  Put a pillow under your hips in this position when doing this exercise to help decrease pressure in the pelvis when it feels "heavy".  Mini-Marches    *Place a folded hand-towel under the small of your back to help you feel where to press into.  Exhale, drawing the lower tummy (TA) in toward the back bone and hold contraction while you lift one foot ~ 2 inches off the mat, then the other foot before relaxing and resetting. Try to keep your hips from rocking, using your hands to sense whether they are staying even as pictured.      Perform _10__ repetitions for _3__ sets. Do this _1-2_ times per day.   Flexors, Lunge  Hip Flexor Stretch: Proposal Pose    Maintain pelvic tuck under, lift pubic bone toward navel. Engage posterior hip muscles (firm glute muscles of leg in back position) and shift forward until you feel stretch on front of leg that is down. To increase stretch, maintain balance and ease hips forward. You may use one hand on a chair for balance if needed. Hold for __5__ breaths. Repeat __2-3__ times each leg.  Do _1-2__ times per day.

## 2019-01-26 NOTE — Therapy (Signed)
[place] MAIN [facility] SERVICES [address] [place], [place], [address] Phone: [phone]   Fax:  [phone]  Physical Therapy Treatment   The patient has been informed of current processes in place at Outpatient Rehab to protect patients from Covid-19 exposure including social distancing, schedule modifications, and new cleaning procedures. After discussing their particular risk with a therapist based on the patient's personal risk factors, the patient has decided to proceed with in-person therapy.   Patient Details  Name: Abigail Malone MRN: [id number] Date of Birth: [birth year] Referring Provider (PT): Zara Council   Encounter Date: 01/26/2019  PT End of Session - 01/26/19 1615    Visit Number  4    Number of Visits  10    Date for PT Re-Evaluation  03/02/19    Authorization - Visit Number  4    Authorization - Number of Visits  10    PT Start Time  1100    PT Stop Time  9381    PT Time Calculation (min)  57 min    Activity Tolerance  Patient tolerated treatment well;No increased pain    Behavior During Therapy  WFL for tasks assessed/performed       Past Medical History:  Diagnosis Date  . Abnormal uterine bleeding (AUB) 05/15/2016  . Allergic rhinitis, seasonal 12/09/2014  . Anemia, unspecified 11/18/2013  . HTN (hypertension) 11/18/2013  . Hypertension   . Menopausal symptoms 08/05/2014  . Right knee pain 07/26/2015    Past Surgical History:  Procedure Laterality Date  . COLONOSCOPY  ~2018    There were no vitals filed for this visit.    Pelvic Floor Physical Therapy Treatment Note  SCREENING  Changes in medications, allergies, or medical history?: none    SUBJECTIVE  Patient reports: She has been sneezing since Friday, feels confident it is allergies. Is really trying to lose weight so she is trying to walk more. Is able to to do seated posterior pelvic tilts to decrease her pain when it becomes elevated.  Only having leakage as she tries to pull her pants down to go to the bathroom.   Precautions:  none  Pain update:  Location of pain: LBP Current pain:  0/10  Max pain:  4/10 Least pain:  0/10 Nature of pain: dull ache  Patient Goals: To not have to wear a pad.   OBJECTIVE  Changes in: Posture/Observations:  Anterior pelvic tilt  Abdominal:  Difficulty coordinating posterior pelvic tilt in hook-lying pre-treatment. Improved coordination following.  Palpation: TTP to B psoas, sensation of need to BM with pressure to distal psoas   INTERVENTIONS THIS SESSION: Manual: performed TP release to B psoas to decrease spasm and pain and allow for improved balance of musculature for improved function and decreased symptoms. Therex: educated on and practiced hip-flexor stretch To maintain and improve muscle length and allow for improved balance of musculature for long-term symptom relief. NM-re-ed: Educated on and practiced hook-lying pelvic tilts and mini marches with max tactile, verbal, and visual cueing to improve recruitment, coordination, and timing of deep-core stabilizers and hip-flexors to prevent return of spasms and symptoms. Self-care: reviewed importance of doing urge-suppression at toilet for remaining urge incontinence.  Total time: 57 min.                            PT Short Term Goals - 12/23/18 1028      PT SHORT TERM GOAL #1  Title  Patient will demonstrate appropriate application of urge-suppression technique to re-train neurological pathway and decrease urge incontinence for improved QOL.    Baseline  Pt. having to wear thick pads daily and restriicting activity engagement due to frequent UUI     Time  5    Period  Weeks    Status  New    Target Date  01/26/19      PT SHORT TERM GOAL #2   Title  Patient will demonstrate improved pelvic alignment and balance of musculature surrounding the pelvis to facilitate decreased PFM spasms and  decrease pelvic pain.    Baseline  Pt. demonstrates anterior pelvic tilt, LLE long and up-slipped and spasms surrounding pelvis.     Time  5    Period  Weeks    Status  New    Target Date  01/26/19      PT SHORT TERM GOAL #3   Title  Patient will report a reduction in pain to no greater than 3/10 over the prior week to demonstrate symptom improvement.    Baseline  6/10 at worst    Time  5    Period  Weeks    Status  New    Target Date  01/26/19        PT Long Term Goals - 12/23/18 1034      PT LONG TERM GOAL #1   Title  Patient will report no episodes of SUIor UUI over the course of the prior two weeks to demonstrate improved functional ability.    Baseline  Pt. wearing heavy pads daily and restricting fluid intake due to significant urinary incontinence.    Time  10    Period  Weeks    Status  New    Target Date  03/02/19      PT LONG TERM GOAL #2   Title  Patient will score at or below 11/300 on the PFDI and 10/100 on the UIQ to demonstrate a clinically meaningful decrease in disability and distress due to pelvic floor dysfunction.    Baseline  PFDI: 56/300, UIQ: 19/100    Time  10    Period  Weeks    Status  New    Target Date  03/02/19      PT LONG TERM GOAL #3   Title  Patient will describe pain no greater than 1/10 during housekeeping, picking strawberries, playing with grandchildren to demonstrate improved functional ability.    Baseline  Pain 6/10 at worst, aggrivated with lying flat in bed, standing from being bent forward    Time  10    Period  Weeks    Status  New    Target Date  03/02/19            Plan - 01/26/19 1616    Clinical Impression Statement  Pt. Pt. Responded well to all interventions today, demonstrating improved ability to coordinate and recruit TA for HEP and decreased spasms and sense of need to have a BM with pressure to Psoas as well as understanding and correct performance of all education and exercises provided today. They will  continue to benefit from skilled physical therapy to work toward remaining goals and maximize function as well as decrease likelihood of symptom increase or recurrence.    PT Next Visit Plan  review TA in hook-lying/mini-marches, add glute strengthening. TP release to adductors? assess PFM over underwear PRN.    PT Home Exercise Plan  urge-suppression, seated posture, side-stretch, low back  stretch, and seated pelvic tilts, supine adductor stretch, happy-baby .    Consulted and Agree with Plan of Care  Patient       Patient will benefit from skilled therapeutic intervention in order to improve the following deficits and impairments:     Visit Diagnosis: 1. Other lack of coordination   2. Other muscle spasm   3. Abnormal posture        Problem List Patient Active Problem List   Diagnosis Date Noted  . Mixed stress and urge urinary incontinence 04/01/2018  . Abnormal uterine bleeding (AUB) 05/15/2016  . Right knee pain 07/26/2015  . Allergic rhinitis, seasonal 12/09/2014  . Menopausal symptoms 08/05/2014  . HTN (hypertension) 11/18/2013  . Anemia, unspecified 11/18/2013   Cleophus MoltKeeli T.  DPT, ATC Cleophus MoltKeeli T  01/26/2019, 4:18 PM  [place] [facility] [facility] [address] [place], [place], [address] Phone: [phone]   Fax:  [phone]  Name: Abigail Malone MRN: [id number] Date of Birth: 27-Oct-1950

## 2019-02-02 ENCOUNTER — Other Ambulatory Visit: Payer: Self-pay

## 2019-02-02 ENCOUNTER — Ambulatory Visit: Payer: PPO

## 2019-02-02 DIAGNOSIS — R278 Other lack of coordination: Secondary | ICD-10-CM | POA: Diagnosis not present

## 2019-02-02 DIAGNOSIS — M62838 Other muscle spasm: Secondary | ICD-10-CM

## 2019-02-02 DIAGNOSIS — R293 Abnormal posture: Secondary | ICD-10-CM

## 2019-02-02 NOTE — Patient Instructions (Addendum)
Self External Trigger Point Relief    1) Wash your hands and prop yourself up in a way where you can easily reach the vagina. You may wish to have a small hand-held mirror near by.  2) Use the 2 middle fingers to put gentle pressure on the three external pelvic floor muscles and hold pressure and take deep breaths as you allow the tension to release and discomfort to dissipate   3) Repeat the process for any trigger points you find spending between 3-10 minutes on this every 1-2 days until you do not find any more trigger points or you are told otherwise by your therapist.  Self Posterior Fourchette Stretching/Mobilization    1) Wash your hands and prop your body up so you can easily reach the vagina, bring hand-held mirror if desired.  2) Apply lubricant to the thumb and vaginal opening  3) Place thumb ~ 1/2 an inch into the vagina with the pad of the thumb pointed down and apply gentle pressure to the posterior fourchette.  4) Gently sweep the thumb side to side and in/out while maintaining pressure down toward the anus. Make sure the pressure is not so great that your muscles tighten up and guard, just enough to create slight discomfort.  Do this for ~ 3 min. Per night to decrease tightness and tenderness at the vaginal opening.  [employer] [facility] internal trigger point release tool [phone]  Dr. Kathline Magic Premium Prostate Massager   [phone] Dr. Fara Olden Prostate Massager available at [facility] in [place], [place], [place] [address]    Bring your knees wide and sit your bottom back toward your heels. Breathe deeply for 5 deep breaths, then relax, repeat 2 more times. Do this 1-3 times per day.

## 2019-02-02 NOTE — Therapy (Signed)
[place] MAIN [facility] SERVICES [address] [place], [place], [address] Phone: [phone]   Fax:  [phone]  Physical Therapy Treatment  The patient has been informed of current processes in place at Outpatient Rehab to protect patients from Covid-19 exposure including social distancing, schedule modifications, and new cleaning procedures. After discussing their particular risk with a therapist based on the patient's personal risk factors, the patient has decided to proceed with in-person therapy.   Patient Details  Name: Abigail Malone MRN: [id number] Date of Birth: 11/29/1950 Referring Provider (PT): Zara Council   Encounter Date: 02/02/2019  PT End of Session - 02/02/19 1311    Visit Number  5    Number of Visits  10    Date for PT Re-Evaluation  03/02/19    Authorization - Visit Number  5    Authorization - Number of Visits  10    PT Start Time  1100    PT Stop Time  1202    PT Time Calculation (min)  62 min    Activity Tolerance  Patient tolerated treatment well;No increased pain    Behavior During Therapy  WFL for tasks assessed/performed       Past Medical History:  Diagnosis Date  . Abnormal uterine bleeding (AUB) 05/15/2016  . Allergic rhinitis, seasonal 12/09/2014  . Anemia, unspecified 11/18/2013  . HTN (hypertension) 11/18/2013  . Hypertension   . Menopausal symptoms 08/05/2014  . Right knee pain 07/26/2015    Past Surgical History:  Procedure Laterality Date  . COLONOSCOPY  ~2018    There were no vitals filed for this visit.    Pelvic Floor Physical Therapy Treatment Note  SCREENING  Changes in medications, allergies, or medical history?: none    SUBJECTIVE  Patient reports: She is still having just as much leakage when trying to get to the toilet, her quick-flicks are not doing the trick. She is frustrated. She notices more control after doing her inner thigh stretch.  Precautions:  none  Pain  update:  Location of pain: LBP Current pain: 0/10  Max pain: 4/10 Least pain: 0/10 Nature of pain:dull ache  Patient Goals: To not have to wear a pad.    OBJECTIVE  Changes in: Posture/Observations:  hyperlordotic  Pelvic Floor External Exam: Introitus Appears: normal Skin integrity: white irritated areas Palpation: TTP through B STP and IC Cough: intact Prolapse visible?: none Scar mobility: decreased  Internal Vaginal Exam: Strength (PERF): 2+/5, 6 sec. Symmetry: similar Palpation: TTP through all PFM B Prolapse: none visualized  Abdominal:  Continues to have difficulty with breathing, is able to perform with concentrated effort, needs reminding.  Palpation: TTP to B adductors  INTERVENTIONS THIS SESSION: Manual: Assessed PFM and performed TP release to B posterior PR and posterior fourchette scar and self TP release to B adductors, external PFM and thumb work for posterior fourchette to decrease spasm and pain and allow for improved balance of musculature for improved function and decreased symptoms. Therex: educated on child's pose stretch To maintain and improve muscle length and allow for improved balance of musculature for long-term symptom relief.   Total time: 62 min.                            PT Short Term Goals - 12/23/18 1028      PT SHORT TERM GOAL #1   Title  Patient will demonstrate appropriate application of urge-suppression technique to  re-train neurological pathway and decrease urge incontinence for improved QOL.    Baseline  Pt. having to wear thick pads daily and restriicting activity engagement due to frequent UUI     Time  5    Period  Weeks    Status  New    Target Date  01/26/19      PT SHORT TERM GOAL #2   Title  Patient will demonstrate improved pelvic alignment and balance of musculature surrounding the pelvis to facilitate decreased PFM spasms and decrease pelvic pain.    Baseline  Pt. demonstrates  anterior pelvic tilt, LLE long and up-slipped and spasms surrounding pelvis.     Time  5    Period  Weeks    Status  New    Target Date  01/26/19      PT SHORT TERM GOAL #3   Title  Patient will report a reduction in pain to no greater than 3/10 over the prior week to demonstrate symptom improvement.    Baseline  6/10 at worst    Time  5    Period  Weeks    Status  New    Target Date  01/26/19        PT Long Term Goals - 12/23/18 1034      PT LONG TERM GOAL #1   Title  Patient will report no episodes of SUIor UUI over the course of the prior two weeks to demonstrate improved functional ability.    Baseline  Pt. wearing heavy pads daily and restricting fluid intake due to significant urinary incontinence.    Time  10    Period  Weeks    Status  New    Target Date  03/02/19      PT LONG TERM GOAL #2   Title  Patient will score at or below 11/300 on the PFDI and 10/100 on the UIQ to demonstrate a clinically meaningful decrease in disability and distress due to pelvic floor dysfunction.    Baseline  PFDI: 56/300, UIQ: 19/100    Time  10    Period  Weeks    Status  New    Target Date  03/02/19      PT LONG TERM GOAL #3   Title  Patient will describe pain no greater than 1/10 during housekeeping, picking strawberries, playing with grandchildren to demonstrate improved functional ability.    Baseline  Pain 6/10 at worst, aggrivated with lying flat in bed, standing from being bent forward    Time  10    Period  Weeks    Status  New    Target Date  03/02/19            Plan - 02/02/19 1312    Clinical Impression Statement  Pt. Pt. Responded well to all interventions today, demonstrating decreased PFM spasms and understanding and correct performance of all education and exercises provided today. They will continue to benefit from skilled physical therapy to work toward remaining goals and maximize function as well as decrease likelihood of symptom increase or recurrence.     PT Next Visit Plan  Perform TP release internally/ review lengthening, review TA in hook-lying/mini-marches, add glute strengthening. TP release to adductors? assess PFM over underwear PRN.    PT Home Exercise Plan  urge-suppression, seated posture, side-stretch, low back stretch, and seated pelvic tilts, supine adductor stretch, happy-baby, Child's pose, Self external and thumb work.    Consulted and Agree with Plan of Care  Patient  Patient will benefit from skilled therapeutic intervention in order to improve the following deficits and impairments:     Visit Diagnosis: 1. Other lack of coordination   2. Other muscle spasm   3. Abnormal posture        Problem List Patient Active Problem List   Diagnosis Date Noted  . Mixed stress and urge urinary incontinence 04/01/2018  . Abnormal uterine bleeding (AUB) 05/15/2016  . Right knee pain 07/26/2015  . Allergic rhinitis, seasonal 12/09/2014  . Menopausal symptoms 08/05/2014  . HTN (hypertension) 11/18/2013  . Anemia, unspecified 11/18/2013   Cleophus MoltKeeli T. Tavari Loadholt DPT, ATC Cleophus MoltKeeli T Kayla Deshaies 02/02/2019, 1:21 PM  [place] [facility] [address] [place], [place], [address] Phone: [phone]   Fax:  [phone]  Name: Abigail Prosehyllis Nora Malone MRN: [id number] Date of Birth: Aug 03, 1950

## 2019-02-09 ENCOUNTER — Other Ambulatory Visit: Payer: Self-pay

## 2019-02-09 ENCOUNTER — Ambulatory Visit: Payer: PPO

## 2019-02-09 DIAGNOSIS — M62838 Other muscle spasm: Secondary | ICD-10-CM

## 2019-02-09 DIAGNOSIS — R293 Abnormal posture: Secondary | ICD-10-CM

## 2019-02-09 DIAGNOSIS — R278 Other lack of coordination: Secondary | ICD-10-CM | POA: Diagnosis not present

## 2019-02-09 NOTE — Patient Instructions (Signed)
   Hip Abduction: Side Leg Lift - Side-Lying    Lie on side. Draw lower tummy muscle (TA) and pelvic floor in, Lift top leg until you feel strong contraction of muscle on the side of the hip. Keep top leg straight with body, toes pointing forward. Do not let your hip roll back! Do _10__ reps per set, __3_ sets per day  Hip Extension:    Lying face down, raise leg just off floor squeezing glutes. Keep leg straight. Hold 1 count. Lower leg to floor. Do _10__ reps per set, __3_ sets for each leg. Optional: Place small pillow under abdomen if back becomes uncomfortable.  Bracing With Bridging (Hook-Lying)    With neutral spine, exhale and tuck the pelvis under and lift your hips off the floor, as you inhale, let the back doen first, then the hips last. Repeat _3x10__ times. Do _1-2__ times a day.       Gently bring the knee of the down-leg up a little and hold for 5 seconds then relax. Do this 5 times before the stretch below.  Bring both knees up to your chest and then hold the one farthest from the edge of the table/bed and let the other relax toward the floor until stretch is felt through the front of the hip.  Hold for __5__ deep belly breaths. Relax. Repeat __2-3__ times per side.   Do this __1-2__ times per day.

## 2019-02-09 NOTE — Therapy (Signed)
[place] [facility] [address] [place], [place], [address] Phone: [phone]   Fax:  [phone]  Physical Therapy Treatment  The patient has been informed of current processes in place at Outpatient Rehab to protect patients from Covid-19 exposure including social distancing, schedule modifications, and new cleaning procedures. After discussing their particular risk with a therapist based on the patient's personal risk factors, the patient has decided to proceed with in-person therapy.   Patient Details  Name: Abigail Malone MRN: [id number] Date of Birth: 01-05-1951 Referring Provider (PT): Michiel CowboyMcGowan, Shannon   Encounter Date: 02/09/2019  PT End of Session - 02/09/19 1421    Visit Number  6    Number of Visits  10    Date for PT Re-Evaluation  03/02/19    Authorization - Visit Number  6    Authorization - Number of Visits  10    PT Start Time  1105    PT Stop Time  1200    PT Time Calculation (min)  55 min    Activity Tolerance  Patient tolerated treatment well;No increased pain    Behavior During Therapy  WFL for tasks assessed/performed       Past Medical History:  Diagnosis Date  . Abnormal uterine bleeding (AUB) 05/15/2016  . Allergic rhinitis, seasonal 12/09/2014  . Anemia, unspecified 11/18/2013  . HTN (hypertension) 11/18/2013  . Hypertension   . Menopausal symptoms 08/05/2014  . Right knee pain 07/26/2015    Past Surgical History:  Procedure Laterality Date  . COLONOSCOPY  ~2018    There were no vitals filed for this visit.   Pelvic Floor Physical Therapy Treatment Note  SCREENING  Changes in medications, allergies, or medical history?: none, has vertiligo but forgot about it.    SUBJECTIVE  Patient reports: She is doing better, holding her water better and moving around better. Is having leakage still with urge drops, only one time since last visit.  Precautions:  none  Pain  update: none  Patient Goals: To not have to wear a pad.   OBJECTIVE  Changes in: Posture/Observations:  Anterior pelvic tilt  Range of Motion/Flexibilty:  Decreased hip EXT B, decreased hip ER ROM  Palpation: TTP to L adductor brevis   INTERVENTIONS THIS SESSION: Therex: educated on and practiced prone/side/lying hip EXT and ABD, hip-flexor hold-relax lengthening, and piliates style bridge to improve strength of muscles opposing tight musculature to allow reciprocal inhibition to improve balance of musculature surrounding the pelvis and improve overall posture for optimal musculature length-tension relationship and function. Manual: Performed TP release to L Adductor brevis to decrease spasm and pain and allow for improved balance of musculature for improved function and decreased symptoms. .  Total time: 55 min.                             PT Short Term Goals - 12/23/18 1028      PT SHORT TERM GOAL #1   Title  Patient will demonstrate appropriate application of urge-suppression technique to re-train neurological pathway and decrease urge incontinence for improved QOL.    Baseline  Pt. having to wear thick pads daily and restriicting activity engagement due to frequent UUI     Time  5    Period  Weeks    Status  New    Target Date  01/26/19      PT SHORT TERM GOAL #2   Title  Patient will demonstrate improved pelvic alignment and balance of musculature surrounding the pelvis to facilitate decreased PFM spasms and decrease pelvic pain.    Baseline  Pt. demonstrates anterior pelvic tilt, LLE long and up-slipped and spasms surrounding pelvis.     Time  5    Period  Weeks    Status  New    Target Date  01/26/19      PT SHORT TERM GOAL #3   Title  Patient will report a reduction in pain to no greater than 3/10 over the prior week to demonstrate symptom improvement.    Baseline  6/10 at worst    Time  5    Period  Weeks    Status  New    Target  Date  01/26/19        PT Long Term Goals - 12/23/18 1034      PT LONG TERM GOAL #1   Title  Patient will report no episodes of SUIor UUI over the course of the prior two weeks to demonstrate improved functional ability.    Baseline  Pt. wearing heavy pads daily and restricting fluid intake due to significant urinary incontinence.    Time  10    Period  Weeks    Status  New    Target Date  03/02/19      PT LONG TERM GOAL #2   Title  Patient will score at or below 11/300 on the PFDI and 10/100 on the UIQ to demonstrate a clinically meaningful decrease in disability and distress due to pelvic floor dysfunction.    Baseline  PFDI: 56/300, UIQ: 19/100    Time  10    Period  Weeks    Status  New    Target Date  03/02/19      PT LONG TERM GOAL #3   Title  Patient will describe pain no greater than 1/10 during housekeeping, picking strawberries, playing with grandchildren to demonstrate improved functional ability.    Baseline  Pain 6/10 at worst, aggrivated with lying flat in bed, standing from being bent forward    Time  10    Period  Weeks    Status  New    Target Date  03/02/19            Plan - 02/09/19 1422    Clinical Impression Statement  Pt. Responded well to all interventions today, demonstrating improved hip EXT and ER ROM, better recruitment of her glutes, as well as understanding and correct performance of all education and exercises provided today. They will continue to benefit from skilled physical therapy to work toward remaining goals and maximize function as well as decrease likelihood of symptom increase or recurrence.    PT Next Visit Plan  review TA in hook-lying/mini-marches, more TP release to adductors/hip flexors. Give TA strengthening.    PT Home Exercise Plan  urge-suppression, seated posture, side-stretch, low back stretch, and seated pelvic tilts, supine adductor stretch, happy-baby, Child's pose, Self external and thumb work.    Consulted and Agree  with Plan of Care  Patient       Patient will benefit from skilled therapeutic intervention in order to improve the following deficits and impairments:     Visit Diagnosis: 1. Other lack of coordination   2. Other muscle spasm   3. Abnormal posture        Problem List Patient Active Problem List   Diagnosis Date Noted  . Mixed stress and urge urinary incontinence  04/01/2018  . Abnormal uterine bleeding (AUB) 05/15/2016  . Right knee pain 07/26/2015  . Allergic rhinitis, seasonal 12/09/2014  . Menopausal symptoms 08/05/2014  . HTN (hypertension) 11/18/2013  . Anemia, unspecified 11/18/2013   Cleophus MoltKeeli T. Gailes DPT, ATC Cleophus MoltKeeli T Gailes 02/09/2019, 2:24 PM  [place] [facility] [address] [place], [place], [address] Phone: [phone]   Fax:  [phone]  Name: Abigail Malone MRN: [id number] Date of Birth: Oct 17, 1950

## 2019-02-16 ENCOUNTER — Ambulatory Visit: Payer: PPO

## 2019-02-16 ENCOUNTER — Other Ambulatory Visit: Payer: Self-pay

## 2019-02-16 DIAGNOSIS — R293 Abnormal posture: Secondary | ICD-10-CM

## 2019-02-16 DIAGNOSIS — M62838 Other muscle spasm: Secondary | ICD-10-CM

## 2019-02-16 DIAGNOSIS — R278 Other lack of coordination: Secondary | ICD-10-CM

## 2019-02-16 NOTE — Therapy (Signed)
[place] [facility] [address] [place], [place], [address] Phone: [phone]   Fax:  [phone]  Physical Therapy Treatment  The patient has been informed of current processes in place at Outpatient Rehab to protect patients from Covid-19 exposure including social distancing, schedule modifications, and new cleaning procedures. After discussing their particular risk with a therapist based on the patient's personal risk factors, the patient has decided to proceed with in-person therapy.   Patient Details  Name: Abigail Malone MRN: [id number] Date of Birth: Jan 04, 1951 Referring Provider (PT): Michiel CowboyMcGowan, Shannon   Encounter Date: 02/16/2019  PT End of Session - 02/16/19 1312    Visit Number  7    Number of Visits  10    Date for PT Re-Evaluation  03/02/19    Authorization - Visit Number  7    Authorization - Number of Visits  10    PT Start Time  1100    PT Stop Time  1200    PT Time Calculation (min)  60 min    Activity Tolerance  Patient tolerated treatment well;No increased pain    Behavior During Therapy  WFL for tasks assessed/performed       Past Medical History:  Diagnosis Date  . Abnormal uterine bleeding (AUB) 05/15/2016  . Allergic rhinitis, seasonal 12/09/2014  . Anemia, unspecified 11/18/2013  . HTN (hypertension) 11/18/2013  . Hypertension   . Menopausal symptoms 08/05/2014  . Right knee pain 07/26/2015    Past Surgical History:  Procedure Laterality Date  . COLONOSCOPY  ~2018    There were no vitals filed for this visit.    [place] [facility] [address] [place], [place], [address] Phone: [phone]   Fax:  [phone]  Physical Therapy Treatment  The patient has been informed of current processes in place at Outpatient Rehab to protect patients from Covid-19 exposure including social distancing, schedule modifications, and new cleaning  procedures. After discussing their particular risk with a therapist based on the patient's personal risk factors, the patient has decided to proceed with in-person therapy.   Patient Details  Name: Abigail Malone MRN: [id number] Date of Birth: Jan 04, 1951 Referring Provider (PT): Michiel CowboyMcGowan, Shannon   Encounter Date: 02/16/2019  PT End of Session - 02/16/19 1312    Visit Number  7    Number of Visits  10    Date for PT Re-Evaluation  03/02/19    Authorization - Visit Number  7    Authorization - Number of Visits  10    PT Start Time  1100    PT Stop Time  1200    PT Time Calculation (min)  60 min    Activity Tolerance  Patient tolerated treatment well;No increased pain    Behavior During Therapy  WFL for tasks assessed/performed       Past Medical History:  Diagnosis Date  . Abnormal uterine bleeding (AUB) 05/15/2016  . Allergic rhinitis, seasonal 12/09/2014  . Anemia, unspecified 11/18/2013  . HTN (hypertension) 11/18/2013  . Hypertension   . Menopausal symptoms 08/05/2014  . Right knee pain 07/26/2015    Past Surgical History:  Procedure Laterality Date  . COLONOSCOPY  ~2018    There were no vitals filed for this visit.   Pelvic Floor Physical Therapy Treatment Note  SCREENING  Changes in medications, allergies, or medical history?: none, has vertiligo but forgot about it.    SUBJECTIVE  Patient reports: She had  a great weekend, has been doing her thumb work but does not have a tool yet. Has not had any leakage over past week.   Precautions:  none  Pain update: none  Patient Goals: To not have to wear a pad.   OBJECTIVE  Changes in: Posture/Observations:  Anterior pelvic tilt  Palpation: TTP to R adductor brevis   INTERVENTIONS THIS SESSION: Self-care: Educated on decreasing wear of pads/pantyliners as her confidence grows/ no episodes of SUI. NM re-ed: Reviewed pelvic tilts and Mini-marches in supine with max cues and educated on how to get on  and off the floor using exhale and proper body mechanics for exercises and decreased fear/increased safety. Manual: Performed TP release to R adductor brevis to decrease spasm and pain and allow for improved balance of musculature for improved function and decreased symptoms. .  Total time: 55 min.                            PT Short Term Goals - 12/23/18 1028      PT SHORT TERM GOAL #1   Title  Patient will demonstrate appropriate application of urge-suppression technique to re-train neurological pathway and decrease urge incontinence for improved QOL.    Baseline  Pt. having to wear thick pads daily and restriicting activity engagement due to frequent UUI     Time  5    Period  Weeks    Status  New    Target Date  01/26/19      PT SHORT TERM GOAL #2   Title  Patient will demonstrate improved pelvic alignment and balance of musculature surrounding the pelvis to facilitate decreased PFM spasms and decrease pelvic pain.    Baseline  Pt. demonstrates anterior pelvic tilt, LLE long and up-slipped and spasms surrounding pelvis.     Time  5    Period  Weeks    Status  New    Target Date  01/26/19      PT SHORT TERM GOAL #3   Title  Patient will report a reduction in pain to no greater than 3/10 over the prior week to demonstrate symptom improvement.    Baseline  6/10 at worst    Time  5    Period  Weeks    Status  New    Target Date  01/26/19        PT Long Term Goals - 12/23/18 1034      PT LONG TERM GOAL #1   Title  Patient will report no episodes of SUIor UUI over the course of the prior two weeks to demonstrate improved functional ability.    Baseline  Pt. wearing heavy pads daily and restricting fluid intake due to significant urinary incontinence.    Time  10    Period  Weeks    Status  New    Target Date  03/02/19      PT LONG TERM GOAL #2   Title  Patient will score at or below 11/300 on the PFDI and 10/100 on the UIQ to demonstrate a  clinically meaningful decrease in disability and distress due to pelvic floor dysfunction.    Baseline  PFDI: 56/300, UIQ: 19/100    Time  10    Period  Weeks    Status  New    Target Date  03/02/19      PT LONG TERM GOAL #3   Title  Patient will describe pain no  greater than 1/10 during housekeeping, picking strawberries, playing with grandchildren to demonstrate improved functional ability.    Baseline  Pain 6/10 at worst, aggrivated with lying flat in bed, standing from being bent forward    Time  10    Period  Weeks    Status  New    Target Date  03/02/19            Plan - 02/16/19 1304    Clinical Impression Statement  Pt. Responded well to all interventions today, demonstrating improved coordination on pelvic tilts in hook-lying and decreased adductor spasms as well as understanding and correct performance of all education and exercises provided today. They will continue to benefit from skilled physical therapy to work toward remaining goals and maximize function as well as decrease likelihood of symptom increase or recurrence.     PT Next Visit Plan  re-assess pelvic alignment and leg-length. review TA in hook-lying/mini-marches, more TP release to adductors/hip flexors. Give TA strengthening.    PT Home Exercise Plan  urge-suppression, seated posture, side-stretch, low back stretch, and seated pelvic tilts, supine adductor stretch, happy-baby, Child's pose, Self external and thumb work.    Consulted and Agree with Plan of Care  Patient       Patient will benefit from skilled therapeutic intervention in order to improve the following deficits and impairments:     Visit Diagnosis: 1. Other lack of coordination   2. Other muscle spasm   3. Abnormal posture        Problem List Patient Active Problem List   Diagnosis Date Noted  . Mixed stress and urge urinary incontinence 04/01/2018  . Abnormal uterine bleeding (AUB) 05/15/2016  . Right knee pain 07/26/2015  .  Allergic rhinitis, seasonal 12/09/2014  . Menopausal symptoms 08/05/2014  . HTN (hypertension) 11/18/2013  . Anemia, unspecified 11/18/2013   Willa Rough DPT, ATC Willa Rough 02/16/2019, 1:15 PM  [place] MAIN [facility] SERVICES [address] [place], [place], [address] Phone: [phone]   Fax:  [phone]  Name: Abigail Malone MRN: [id number] Date of Birth: 1951/05/27

## 2019-02-16 NOTE — Patient Instructions (Signed)
   Put both hands on your knee, or if a chair is near by put one hand on a chair, the other on the opposite knee to press up to standing.  Pelvic Tilt With Pelvic Floor (Hook-Lying)        Lie with hips and knees bent. Squeeze pelvic floor and flatten low back while breathing out so that pelvis tilts. Repeat _2x15__ times. Do _1-2__ times a day.

## 2019-02-23 ENCOUNTER — Other Ambulatory Visit: Payer: Self-pay

## 2019-02-23 ENCOUNTER — Ambulatory Visit: Payer: PPO | Attending: Urology

## 2019-02-23 DIAGNOSIS — M62838 Other muscle spasm: Secondary | ICD-10-CM | POA: Diagnosis not present

## 2019-02-23 DIAGNOSIS — R293 Abnormal posture: Secondary | ICD-10-CM | POA: Diagnosis not present

## 2019-02-23 DIAGNOSIS — R278 Other lack of coordination: Secondary | ICD-10-CM

## 2019-02-23 NOTE — Patient Instructions (Signed)
Pelvic Tilt With Pelvic Floor (Hook-Lying)        Lie with hips and knees bent. Squeeze the belly button into the back bone and flatten low back while breathing out so that pelvis tilts. Repeat _2x15__ times. Do _1-3__ times a day.  Put a hand towel under the small of your back and when you breathe out, press your low back into it.

## 2019-02-23 NOTE — Therapy (Signed)
[place] MAIN [facility] SERVICES [address] [place], [place], [address] Phone: [phone]   Fax:  [phone]  Physical Therapy Treatment  The patient has been informed of current processes in place at Outpatient Rehab to protect patients from Covid-19 exposure including social distancing, schedule modifications, and new cleaning procedures. After discussing their particular risk with a therapist based on the patient's personal risk factors, the patient has decided to proceed with in-person therapy.   Patient Details  Name: Abigail Malone MRN: [id number] Date of Birth: [birth year] Referring Provider (PT): Zara Council   Encounter Date: 02/23/2019    Past Medical History:  Diagnosis Date  . Abnormal uterine bleeding (AUB) 05/15/2016  . Allergic rhinitis, seasonal 12/09/2014  . Anemia, unspecified 11/18/2013  . HTN (hypertension) 11/18/2013  . Hypertension   . Menopausal symptoms 08/05/2014  . Right knee pain 07/26/2015    Past Surgical History:  Procedure Laterality Date  . COLONOSCOPY  ~2018    There were no vitals filed for this visit.     Pelvic Floor Physical Therapy Treatment Note  SCREENING  Changes in medications, allergies, or medical history?: none, has vertiligo but forgot about it.    SUBJECTIVE  Patient reports: She is doing her thumb work and using the tool and can tell it is helping. She is having leakage less often, only when she has waited too long to go to the bathroom.   Precautions:  none  Pain update: none  Patient Goals: To not have to wear a pad.   OBJECTIVE  Changes in: Posture/Observations:  Anterior pelvic tilt, decreased mobility through entire spine, minimal flexion through lumbar spine with forward bend  Range of Motion/mobility: Decreased PAIVM through entire spine   INTERVENTIONS THIS SESSION: NM re-ed: Reviewed pelvic tilts and Mini-marches in supine with max cues and educated  on how to get on and off the floor using exhale and proper body mechanics for exercises and decreased  Manual: Performed PAIVM grade 3-4 mobs in P/A direction through the length of the spine to improve mobility of joint and surrounding connective tissue and decrease pressure on nerve roots for improved conductivity and function of down-stream tissues and allow for improved recruitment of the deep-core musculature.  Total time: 65 min.                           PT Short Term Goals - 12/23/18 1028      PT SHORT TERM GOAL #1   Title  Patient will demonstrate appropriate application of urge-suppression technique to re-train neurological pathway and decrease urge incontinence for improved QOL.    Baseline  Pt. having to wear thick pads daily and restriicting activity engagement due to frequent UUI     Time  5    Period  Weeks    Status  New    Target Date  01/26/19      PT SHORT TERM GOAL #2   Title  Patient will demonstrate improved pelvic alignment and balance of musculature surrounding the pelvis to facilitate decreased PFM spasms and decrease pelvic pain.    Baseline  Pt. demonstrates anterior pelvic tilt, LLE long and up-slipped and spasms surrounding pelvis.     Time  5    Period  Weeks    Status  New    Target Date  01/26/19      PT SHORT TERM GOAL #3   Title  Patient will report a  reduction in pain to no greater than 3/10 over the prior week to demonstrate symptom improvement.    Baseline  6/10 at worst    Time  5    Period  Weeks    Status  New    Target Date  01/26/19        PT Long Term Goals - 12/23/18 1034      PT LONG TERM GOAL #1   Title  Patient will report no episodes of SUIor UUI over the course of the prior two weeks to demonstrate improved functional ability.    Baseline  Pt. wearing heavy pads daily and restricting fluid intake due to significant urinary incontinence.    Time  10    Period  Weeks    Status  New    Target Date   03/02/19      PT LONG TERM GOAL #2   Title  Patient will score at or below 11/300 on the PFDI and 10/100 on the UIQ to demonstrate a clinically meaningful decrease in disability and distress due to pelvic floor dysfunction.    Baseline  PFDI: 56/300, UIQ: 19/100    Time  10    Period  Weeks    Status  New    Target Date  03/02/19      PT LONG TERM GOAL #3   Title  Patient will describe pain no greater than 1/10 during housekeeping, picking strawberries, playing with grandchildren to demonstrate improved functional ability.    Baseline  Pain 6/10 at worst, aggrivated with lying flat in bed, standing from being bent forward    Time  10    Period  Weeks    Status  New    Target Date  03/02/19              Patient will benefit from skilled therapeutic intervention in order to improve the following deficits and impairments:     Visit Diagnosis: No diagnosis found.     Problem List Patient Active Problem List   Diagnosis Date Noted  . Mixed stress and urge urinary incontinence 04/01/2018  . Abnormal uterine bleeding (AUB) 05/15/2016  . Right knee pain 07/26/2015  . Allergic rhinitis, seasonal 12/09/2014  . Menopausal symptoms 08/05/2014  . HTN (hypertension) 11/18/2013  . Anemia, unspecified 11/18/2013   Cleophus MoltKeeli T. Rumi Kolodziej DPT, ATC Cleophus MoltKeeli T Rehema Muffley 02/23/2019, 11:09 AM  [place] [facility] [address] [place], [place], [address] Phone: [phone]   Fax:  [phone]  Name: Abigail Malone MRN: [id number] Date of Birth: [birth year]

## 2019-03-02 ENCOUNTER — Ambulatory Visit: Payer: PPO

## 2019-03-06 ENCOUNTER — Ambulatory Visit: Payer: PPO

## 2019-03-06 ENCOUNTER — Other Ambulatory Visit: Payer: Self-pay

## 2019-03-06 DIAGNOSIS — R293 Abnormal posture: Secondary | ICD-10-CM

## 2019-03-06 DIAGNOSIS — M62838 Other muscle spasm: Secondary | ICD-10-CM

## 2019-03-06 DIAGNOSIS — R278 Other lack of coordination: Secondary | ICD-10-CM | POA: Diagnosis not present

## 2019-03-06 NOTE — Patient Instructions (Signed)
   1) Breathe in a little, tuck pelvis under slightly and cough 3 times, pulling the back into the bed with each cough. Do this 10x2, 1-2 times per day.   2) Follow this with your pelvic tilts and breathing, again take a small breath in, big breath out, pushing all the air out. Pelvic Tilt With Pelvic Floor (Hook-Lying)        Lie with hips and knees bent. Squeeze pelvic floor and flatten low back while breathing out so that pelvis tilts. Repeat _15x2__ times. Do _1-2__ times a day.

## 2019-03-06 NOTE — Therapy (Signed)
[place] [facility] [address] [place], [place], [address] Phone: [phone]   Fax:  [phone]  Physical Therapy Treatment  The patient has been informed of current processes in place at Outpatient Rehab to protect patients from Covid-19 exposure including social distancing, schedule modifications, and new cleaning procedures. After discussing their particular risk with a therapist based on the patient's personal risk factors, the patient has decided to proceed with in-person therapy.   Patient Details  Name: Abigail Malone MRN: [id number] Date of Birth: 1950-10-23 Referring Provider (PT): Michiel CowboyMcGowan, Shannon   Encounter Date: 03/06/2019  PT End of Session - 03/06/19 1024    Visit Number  9    Number of Visits  10    Date for PT Re-Evaluation  03/02/19    Authorization - Visit Number  8    Authorization - Number of Visits  10    PT Start Time  0905    PT Stop Time  1005    PT Time Calculation (min)  60 min    Activity Tolerance  Patient tolerated treatment well;No increased pain    Behavior During Therapy  WFL for tasks assessed/performed       Past Medical History:  Diagnosis Date  . Abnormal uterine bleeding (AUB) 05/15/2016  . Allergic rhinitis, seasonal 12/09/2014  . Anemia, unspecified 11/18/2013  . HTN (hypertension) 11/18/2013  . Hypertension   . Menopausal symptoms 08/05/2014  . Right knee pain 07/26/2015    Past Surgical History:  Procedure Laterality Date  . COLONOSCOPY  ~2018    There were no vitals filed for this visit.     Pelvic Floor Physical Therapy Treatment Note  SCREENING  Changes in medications, allergies, or medical history?: none   SUBJECTIVE  Patient reports: [place] been at the beach, is doing well, no pain but no change in leakage.  Precautions:  none  Pain update: none  Patient Goals: To not have to wear a pad.   OBJECTIVE  Changes in: Posture/Observations:  Anterior  pelvic tilt  Range of Motion/mobility: Decreased mobility/pain with pressure at T-L and L-S junctions  Palpation:  TTP to B T12/L1 and L5 multifidus   INTERVENTIONS THIS SESSION: NM re-ed: Educated on and practiced short, medium, and long "ha-ha" and cough with posterior pelvic tilt exercise to improve recruitment of the TA, Reviewed pelvic tilts with focus on short inhale, long exhale for improved TA recruitment and decreased Obliqe overactivity inhibiting correct performance. Manual: Performed TP release to B T12/L1 and L5 multifidus and grade 3-4 PA mobs at the same levels to decrease tension on nerve roots and allow for improved recruitment and coordination of deep-core musculature.  Total time: 60 min.                             PT Short Term Goals - 12/23/18 1028      PT SHORT TERM GOAL #1   Title  Patient will demonstrate appropriate application of urge-suppression technique to re-train neurological pathway and decrease urge incontinence for improved QOL.    Baseline  Pt. having to wear thick pads daily and restriicting activity engagement due to frequent UUI     Time  5    Period  Weeks    Status  New    Target Date  01/26/19      PT SHORT TERM GOAL #2   Title  Patient will demonstrate improved pelvic alignment  and balance of musculature surrounding the pelvis to facilitate decreased PFM spasms and decrease pelvic pain.    Baseline  Pt. demonstrates anterior pelvic tilt, LLE long and up-slipped and spasms surrounding pelvis.     Time  5    Period  Weeks    Status  New    Target Date  01/26/19      PT SHORT TERM GOAL #3   Title  Patient will report a reduction in pain to no greater than 3/10 over the prior week to demonstrate symptom improvement.    Baseline  6/10 at worst    Time  5    Period  Weeks    Status  New    Target Date  01/26/19        PT Long Term Goals - 12/23/18 1034      PT LONG TERM GOAL #1   Title  Patient will report  no episodes of SUIor UUI over the course of the prior two weeks to demonstrate improved functional ability.    Baseline  Pt. wearing heavy pads daily and restricting fluid intake due to significant urinary incontinence.    Time  10    Period  Weeks    Status  New    Target Date  03/02/19      PT LONG TERM GOAL #2   Title  Patient will score at or below 11/300 on the PFDI and 10/100 on the UIQ to demonstrate a clinically meaningful decrease in disability and distress due to pelvic floor dysfunction.    Baseline  PFDI: 56/300, UIQ: 19/100    Time  10    Period  Weeks    Status  New    Target Date  03/02/19      PT LONG TERM GOAL #3   Title  Patient will describe pain no greater than 1/10 during housekeeping, picking strawberries, playing with grandchildren to demonstrate improved functional ability.    Baseline  Pain 6/10 at worst, aggrivated with lying flat in bed, standing from being bent forward    Time  10    Period  Weeks    Status  New    Target Date  03/02/19            Plan - 03/06/19 1026    Clinical Impression Statement  Pt. Responded well to all interventions today, demonstrating improved ability to recruit deep-core with better consistency and decreased spasms as well as understanding and correct performance of all education and exercises provided today. They will continue to benefit from skilled physical therapy to work toward remaining goals and maximize function as well as decrease likelihood of symptom increase or recurrence.     PT Next Visit Plan  RE-ASSESS GOALS AND re-cret if appropriate!!!!! re-assess pelvic alignment and leg-length. review TA in hook-lying/mini-marches, more TP release to adductors/hip flexors. Give TA strengthening.    PT Home Exercise Plan  urge-suppression, seated posture, side-stretch, low back stretch, and seated pelvic tilts, supine adductor stretch, happy-baby, Child's pose, Self external and thumb work.    Consulted and Agree with Plan  of Care  Patient       Patient will benefit from skilled therapeutic intervention in order to improve the following deficits and impairments:     Visit Diagnosis: 1. Other lack of coordination   2. Other muscle spasm   3. Abnormal posture        Problem List Patient Active Problem List   Diagnosis Date Noted  .  Mixed stress and urge urinary incontinence 04/01/2018  . Abnormal uterine bleeding (AUB) 05/15/2016  . Right knee pain 07/26/2015  . Allergic rhinitis, seasonal 12/09/2014  . Menopausal symptoms 08/05/2014  . HTN (hypertension) 11/18/2013  . Anemia, unspecified 11/18/2013   Willa Rough DPT, ATC Willa Rough 03/06/2019, 10:28 AM  [place] MAIN [facility] SERVICES [address] [place], [place], [address] Phone: [phone]   Fax:  [phone]  Name: Abigail Malone MRN: [id number] Date of Birth: Dec 14, 1950

## 2019-03-09 ENCOUNTER — Other Ambulatory Visit: Payer: Self-pay

## 2019-03-09 ENCOUNTER — Ambulatory Visit: Payer: PPO

## 2019-03-09 DIAGNOSIS — R293 Abnormal posture: Secondary | ICD-10-CM

## 2019-03-09 DIAGNOSIS — M62838 Other muscle spasm: Secondary | ICD-10-CM

## 2019-03-09 DIAGNOSIS — R278 Other lack of coordination: Secondary | ICD-10-CM

## 2019-03-09 NOTE — Therapy (Signed)
[place] MAIN [facility] SERVICES [address] [place], [place], [address] Phone: [phone]   Fax:  [phone]  Physical Therapy Treatment  The patient has been informed of current processes in place at Outpatient Rehab to protect patients from Covid-19 exposure including social distancing, schedule modifications, and new cleaning procedures. After discussing their particular risk with a therapist based on the patient's personal risk factors, the patient has decided to proceed with in-person therapy.   Patient Details  Name: Abigail Malone MRN: [id number] Date of Birth: 03/29/1951 Referring Provider (PT): Zara Council   Encounter Date: 03/09/2019  PT End of Session - 03/09/19 1249    Visit Number  10    Number of Visits  10    Date for PT Re-Evaluation  05/04/19    Authorization - Visit Number  10    Authorization - Number of Visits  10    PT Start Time  1112    PT Stop Time  1207    PT Time Calculation (min)  55 min    Activity Tolerance  Patient tolerated treatment well;No increased pain    Behavior During Therapy  WFL for tasks assessed/performed       Past Medical History:  Diagnosis Date  . Abnormal uterine bleeding (AUB) 05/15/2016  . Allergic rhinitis, seasonal 12/09/2014  . Anemia, unspecified 11/18/2013  . HTN (hypertension) 11/18/2013  . Hypertension   . Menopausal symptoms 08/05/2014  . Right knee pain 07/26/2015    Past Surgical History:  Procedure Laterality Date  . COLONOSCOPY  ~2018    There were no vitals filed for this visit.     Pelvic Floor Physical Therapy Treatment Note  SCREENING  Changes in medications, allergies, or medical history?: none   SUBJECTIVE  Patient reports: Has been having difficulty trying to breathe correctly. Not having as many SUI episodes. None within the last week. Had a couple the week before where she had a major and a minor episode.  Precautions:  none  Pain  update: none  Patient Goals: To not have to wear a pad.   OBJECTIVE  Changes in: Posture/Observations:  Anterior pelvic tilt, knees hyper-extended on L, slight bend on R, PSIS high. Leg-length: L87.5cm, R: 88cm  *PSIS level with heel-lift in L shoe (2/3)  Range of Motion/mobility: Decreased lumbar flexion ROM with Cat/cow   Pelvic Floor External Exam: Introitus Appears: normal Skin integrity: vertiligo Palpation: TTP on R only   Internal Vaginal Exam: Strength (PERF): 2+/5 when cued for PFM only. Has accessory glute that inhibits PFM. Symmetry: R>L for TTP Palpation: TTP through all but L anterior PR/PC. Prolapse: none     INTERVENTIONS THIS SESSION: NM re-ed: Educated on and practiced mod. Cat-cow in standing with knees bent to improve coordination and recruitment of the TA and diaphragm, Educated on and practiced prone hip EXT to isolate glute activation and strengthen with Pt. Hand on glute for feedback. Manual: re-assessed leg-length and PFM and Performed TP release to R anterior PR to allow for POC development and decrease spasm and pain and allow for improved balance of musculature for improved function and decreased symptoms. Self-care: reviewed and updated goals and discussed POC. Gave Pt. Heel-lift in L shoe to improve pelvic alignment and help Pt. Maintain PFM relaxation for further symptom improvement.  Total time: 55 min.  PT Short Term Goals - 03/09/19 1119      PT SHORT TERM GOAL #1   Title  Patient will demonstrate appropriate application of urge-suppression technique to re-train neurological pathway and decrease urge incontinence for improved QOL.    Baseline  Pt. having to wear thick pads daily and restriicting activity engagement due to frequent UUI. As of 8/17: Pt. has had no incontinence episode for 1 week but continues to struggle with coordination to allow for optimal use of urge-suppression.     Time  5    Period  Weeks    Status  On-going    Target Date  04/06/19      PT SHORT TERM GOAL #2   Title  Patient will demonstrate improved pelvic alignment and balance of musculature surrounding the pelvis to facilitate decreased PFM spasms and decrease pelvic pain.    Baseline  Pt. demonstrates anterior pelvic tilt, LLE long and up-slipped and spasms surrounding pelvis. As of 8/17: RLE measured long by .5cm, heel-lift given.    Time  5    Period  Weeks    Status  On-going    Target Date  04/06/19      PT SHORT TERM GOAL #3   Title  Patient will report a reduction in pain to no greater than 3/10 over the prior week to demonstrate symptom improvement.    Baseline  6/10 at worst    Time  5    Period  Weeks    Status  Achieved    Target Date  01/26/19        PT Long Term Goals - 03/09/19 1119      PT LONG TERM GOAL #1   Title  Patient will report no episodes of SUIor UUI over the course of the prior two weeks to demonstrate improved functional ability.    Baseline  Pt. wearing heavy pads daily and restricting fluid intake due to significant urinary incontinence. As of 8/17: has gone 1 week with no episodes, not two.    Time  10    Period  Weeks    Status  On-going    Target Date  05/04/19      PT LONG TERM GOAL #2   Title  Patient will score at or below 11/300 on the PFDI and 10/100 on the UIQ to demonstrate a clinically meaningful decrease in disability and distress due to pelvic floor dysfunction.    Baseline  PFDI: 56/300, UIQ: 19/100 As of 8/17: PFDI: 23/300, PFIQ: 19/300    Time  10    Period  Weeks    Status  On-going    Target Date  05/04/19      PT LONG TERM GOAL #3   Title  Patient will describe pain no greater than 1/10 during housekeeping, picking strawberries, playing with grandchildren to demonstrate improved functional ability.    Baseline  Pain 6/10 at worst, aggrivated with lying flat in bed, standing from being bent forward    Time  10    Period  Weeks     Status  Achieved    Target Date  03/02/19            Plan - 03/09/19 1251    Clinical Impression Statement  Pt. is demonstrating improvement, having met pain-related goals and gone 1 week without UUI but she countinues to demonstrate poor core coordination, spasms, and had a big UUI incident less than 2 weeks prior. Heel-lift added today to see if this  will decrease re-irritation of lumbosacral nerves to allow for longer term spasm reduction/ improved function. Pt will continue to benefit from pelvic health PT for 8 more weeks at 1x/wk to work toward original goals and increase strengthening for long-term improvement.    Personal Factors and Comorbidities  Age    Examination-Activity Limitations  Continence    Examination-Participation Restrictions  Church;Shop;Community Activity;Interpersonal Relationship;Cleaning    Stability/Clinical Decision Making  Evolving/Moderate complexity    Clinical Decision Making  Moderate    Rehab Potential  Good    PT Frequency  1x / week    PT Duration  8 weeks    PT Treatment/Interventions  ADLs/Self Care Home Management;Electrical Stimulation;Moist Heat;Traction;Functional mobility training;Gait training;Therapeutic activities;Therapeutic exercise;Neuromuscular re-education;Manual techniques;Patient/family education;Dry needling;Taping;Spinal Manipulations;Joint Manipulations    PT Next Visit Plan  TP release internally, LB stretch, towel extensions, assess gait and re-assess pelvic alignment with heel-lift.    PT Home Exercise Plan  urge-suppression, seated posture, side-stretch, low back stretch, and seated pelvic tilts, supine adductor stretch, happy-baby, Child's pose, Self external and thumb work.    Consulted and Agree with Plan of Care  Patient       Patient will benefit from skilled therapeutic intervention in order to improve the following deficits and impairments:  Increased fascial restricitons, Improper body mechanics, Pain, Decreased  coordination, Decreased scar mobility, Increased muscle spasms, Postural dysfunction, Decreased activity tolerance, Difficulty walking, Impaired flexibility  Visit Diagnosis: 1. Other lack of coordination   2. Other muscle spasm   3. Abnormal posture        Problem List Patient Active Problem List   Diagnosis Date Noted  . Mixed stress and urge urinary incontinence 04/01/2018  . Abnormal uterine bleeding (AUB) 05/15/2016  . Right knee pain 07/26/2015  . Allergic rhinitis, seasonal 12/09/2014  . Menopausal symptoms 08/05/2014  . HTN (hypertension) 11/18/2013  . Anemia, unspecified 11/18/2013   Willa Rough DPT, ATC Willa Rough 03/09/2019, 1:31 PM  [place] MAIN [facility] SERVICES [address] [place], [place], [address] Phone: [phone]   Fax:  [phone]  Name: Abigail Malone MRN: [id number] Date of Birth: 07/22/1951

## 2019-03-09 NOTE — Patient Instructions (Signed)
Cat / Cow Flow   Do this in standing, knees soft, leaning onto chair/couch.  Exhale, press spine toward ceiling like a Halloween cat. Keeping strength in arms and abdominals, Inhale to soften spine through neutral and into cow pose. Open chest and arch back. Initiate movement between cat and cow at tailbone, one vertebrae at a time. Repeat __30__ times.    Hip Extension:  *Use your hand on your buttock to feel the muscle contract.  Lying face down, exhale and raise leg just off floor squeezing glutes. Keep leg straight. Hold 1 count. Lower leg to floor. Do _10__ reps per set, __3_ sets for each leg. Optional: Place small pillow under abdomen if back becomes uncomfortable.    As you start wearing your heel-lift only wear it for an hour the first day and increase by an hour each day so you can allow for the body to adapt to the change easily without much pain. If your pain increases by more than 1-2 points, back off slightly or slow down how quickly you increase your wear time. Once you reach a full day of wear, use it as much as possible forever, even in house-shoes or flip-flops if necessary to keep yourself from reverting to bad pelvic and spinal alignment and having symptoms return.    Adjust-a-lift heel lift Can be found at [email or web address]

## 2019-03-16 ENCOUNTER — Ambulatory Visit: Payer: PPO

## 2019-03-16 ENCOUNTER — Other Ambulatory Visit: Payer: Self-pay

## 2019-03-16 DIAGNOSIS — R278 Other lack of coordination: Secondary | ICD-10-CM | POA: Diagnosis not present

## 2019-03-16 DIAGNOSIS — R293 Abnormal posture: Secondary | ICD-10-CM

## 2019-03-16 DIAGNOSIS — M62838 Other muscle spasm: Secondary | ICD-10-CM

## 2019-03-16 NOTE — Patient Instructions (Signed)
  Inhale up Exhale back!!!!!  Place foam roller or towel under your upper back between your shoulder blades. Support your head with your hands, elbows forward, and gently rock back and forth and side to side to improve motion in your back.   Move the foam roller or towel up and down to a few spots in the upper back, repeating the process.   Cat / Cow Flow    Exhale, press lower spine toward ceiling like a Halloween cat. Engaging the lower abdominals, Inhale to soften spine through neutral and into cow pose. Open chest and arch back. Initiate movement between cat and cow at tailbone, one vertebrae at a time. Repeat __30__ times.    Sit, feet flat, scoot forward to the edge of the chair. Inhale as you bend forward at hips, begin to exhale just before and while you stand, contracting the glutes, lower tummy muscles and pelvic floor as if stopping urination as you stand up.   * Do this every time you sit or stand! If you catch yourself doing it "wrong, re-set and do it again so it can become habit!     Keep your trunk as one unit and let it hinge forward from the hips as you push your bottom back and bend your knees at the same rate that you bend your hips. Keep your weight back toward your heels but do not actually lift the toes off the ground. Exhale starting just before and all the way through standing to help engage the glutes and lower tummy muscles.

## 2019-03-16 NOTE — Therapy (Signed)
[place] MAIN [facility] SERVICES [address] [place], [place], [address] Phone: [phone]   Fax:  [phone]  Physical Therapy Treatment  The patient has been informed of current processes in place at Outpatient Rehab to protect patients from Covid-19 exposure including social distancing, schedule modifications, and new cleaning procedures. After discussing their particular risk with a therapist based on the patient's personal risk factors, the patient has decided to proceed with in-person therapy.   Patient Details  Name: Abigail Malone MRN: [id number] Date of Birth: 1951/05/20 Referring Provider (PT): Zara Council   Encounter Date: 03/16/2019  PT End of Session - 03/16/19 1308    Visit Number  11    Number of Visits  18    Date for PT Re-Evaluation  05/04/19    Authorization - Visit Number  1    Authorization - Number of Visits  8    PT Start Time  1110    PT Stop Time  1210    PT Time Calculation (min)  60 min    Activity Tolerance  Patient tolerated treatment well;No increased pain    Behavior During Therapy  WFL for tasks assessed/performed       Past Medical History:  Diagnosis Date  . Abnormal uterine bleeding (AUB) 05/15/2016  . Allergic rhinitis, seasonal 12/09/2014  . Anemia, unspecified 11/18/2013  . HTN (hypertension) 11/18/2013  . Hypertension   . Menopausal symptoms 08/05/2014  . Right knee pain 07/26/2015    Past Surgical History:  Procedure Laterality Date  . COLONOSCOPY  ~2018    There were no vitals filed for this visit.    Pelvic Floor Physical Therapy Treatment Note  SCREENING  Changes in medications, allergies, or medical history?: none   SUBJECTIVE  Patient reports: Has done well with her heel-lift, not having any increased pain since the first day following her treatment. No pain, no leakage. Was brave enough today to go out without any panty-liner on. Her stream is stronger when she goes to  the restroom. Is able to have a BM easier rather than having a small diameter with hard time pushing. Is drinking ~ 64 oz. Through the day. Having nocturia x1-2.   Precautions:  none  Pain update: none  Patient Goals: To not have to wear a pad.   OBJECTIVE  Changes in: Posture/Observations:  Continued hyperlordosis but able to better sense where neutral is and adjust posture with min. Cues.  Abdominal: Pt. Required cues to leave glottis open with exhale and gently tuck pelvis under to encourage more full exhale and decrease light-headedness.      INTERVENTIONS THIS SESSION: NM re-ed: Educated on how to use exhale with exertion to engage deep-core and improve timing and coordination and prevent from low-back overuse. Educated on how to use body mechanics with ironing and doing laundry to improve muscular balance surrounding her pelvis.  Therex: Educated on and practiced regular Cat-cow instead of modified to improve coordination and recruitment of the TA and diaphragm, Educated on and practiced thoracic extensions over towel roll to improve mobility of joint and surrounding connective tissue and decrease pressure on nerve roots for improved conductivity and function of down-stream tissues. Modified low-back stretch to standing to increase intensity.  Total time: 60 min.                                 PT Short Term Goals - 03/16/19  1119      PT SHORT TERM GOAL #1   Title  Patient will demonstrate appropriate application of urge-suppression technique to re-train neurological pathway and decrease urge incontinence for improved QOL.    Baseline  Pt. having to wear thick pads daily and restriicting activity engagement due to frequent UUI. As of 8/17: Pt. has had no incontinence episode for 1 week but continues to struggle with coordination to allow for optimal use of urge-suppression.    Time  5    Period  Weeks    Status  Achieved    Target Date   04/06/19      PT SHORT TERM GOAL #2   Title  Patient will demonstrate improved pelvic alignment and balance of musculature surrounding the pelvis to facilitate decreased PFM spasms and decrease pelvic pain.    Baseline  Pt. demonstrates anterior pelvic tilt, LLE long and up-slipped and spasms surrounding pelvis. As of 8/17: RLE measured long by .5cm, heel-lift given.    Time  5    Period  Weeks    Status  Achieved    Target Date  04/06/19      PT SHORT TERM GOAL #3   Title  Patient will report a reduction in pain to no greater than 3/10 over the prior week to demonstrate symptom improvement.    Baseline  6/10 at worst    Time  5    Period  Weeks    Status  Achieved    Target Date  01/26/19        PT Long Term Goals - 03/16/19 1119      PT LONG TERM GOAL #1   Title  Patient will report no episodes of SUIor UUI over the course of the prior two weeks to demonstrate improved functional ability.    Baseline  Pt. wearing heavy pads daily and restricting fluid intake due to significant urinary incontinence. As of 8/17: has gone 1 week with no episodes, not two.    Time  10    Period  Weeks    Status  Achieved      PT LONG TERM GOAL #2   Title  Patient will score at or below 11/300 on the PFDI and 10/100 on the UIQ to demonstrate a clinically meaningful decrease in disability and distress due to pelvic floor dysfunction.    Baseline  PFDI: 56/300, UIQ: 19/100 As of 8/17: PFDI: 23/300, PFIQ: 19/300    Time  10    Period  Weeks    Status  On-going      PT LONG TERM GOAL #3   Title  Patient will describe pain no greater than 1/10 during housekeeping, picking strawberries, playing with grandchildren to demonstrate improved functional ability.    Baseline  Pain 6/10 at worst, aggrivated with lying flat in bed, standing from being bent forward    Time  10    Period  Weeks    Status  Achieved            Plan - 03/16/19 1310    Clinical Impression Statement  Pt. Responded well to  all interventions today, demonstrating improved TA recruitment and postural awareness as well as understanding and correct performance of all education and exercises provided today. They will continue to benefit from skilled physical therapy to work toward remaining goals and maximize function as well as decrease likelihood of symptom increase or recurrence.     Personal Factors and Comorbidities  Age  Examination-Activity Limitations  Continence    Examination-Participation Restrictions  Church;Shop;Community Activity;Interpersonal Relationship;Cleaning    Stability/Clinical Decision Making  Evolving/Moderate complexity    Rehab Potential  Good    PT Frequency  1x / week    PT Duration  8 weeks    PT Treatment/Interventions  ADLs/Self Care Home Management;Electrical Stimulation;Moist Heat;Traction;Functional mobility training;Gait training;Therapeutic activities;Therapeutic exercise;Neuromuscular re-education;Manual techniques;Patient/family education;Dry needling;Taping;Spinal Manipulations;Joint Manipulations    PT Next Visit Plan  assess gait and re-assess pelvic alignment with heel-lift. Review cat-cow again    PT Home Exercise Plan  urge-suppression, seated posture, side-stretch, low back stretch, and seated pelvic tilts, supine adductor stretch, happy-baby, Child's pose, Self external and thumb work, cat-cow, thoracic extensions over towel, sit-to-stand.    Consulted and Agree with Plan of Care  Patient       Patient will benefit from skilled therapeutic intervention in order to improve the following deficits and impairments:  Increased fascial restricitons, Improper body mechanics, Pain, Decreased coordination, Decreased scar mobility, Increased muscle spasms, Postural dysfunction, Decreased activity tolerance, Difficulty walking, Impaired flexibility  Visit Diagnosis: Other lack of coordination  Other muscle spasm  Abnormal posture     Problem List Patient Active Problem List    Diagnosis Date Noted  . Mixed stress and urge urinary incontinence 04/01/2018  . Abnormal uterine bleeding (AUB) 05/15/2016  . Right knee pain 07/26/2015  . Allergic rhinitis, seasonal 12/09/2014  . Menopausal symptoms 08/05/2014  . HTN (hypertension) 11/18/2013  . Anemia, unspecified 11/18/2013   Cleophus MoltKeeli T. Gumecindo Hopkin DPT, ATC Cleophus MoltKeeli T Wilbert Schouten 03/16/2019, 1:15 PM  [place] [facility] [address] [place], [place], [id number] Phone: [phone]   Fax:  [phone]  Name: Abigail Malone MRN: [id number] Date of Birth: 30-Sep-1950

## 2019-03-23 ENCOUNTER — Other Ambulatory Visit: Payer: Self-pay

## 2019-03-23 ENCOUNTER — Ambulatory Visit: Payer: PPO

## 2019-03-23 DIAGNOSIS — R278 Other lack of coordination: Secondary | ICD-10-CM | POA: Diagnosis not present

## 2019-03-23 DIAGNOSIS — M62838 Other muscle spasm: Secondary | ICD-10-CM

## 2019-03-23 DIAGNOSIS — R293 Abnormal posture: Secondary | ICD-10-CM

## 2019-03-23 NOTE — Patient Instructions (Signed)
     Bring both knees up to your chest and then hold the one farthest from the edge of the table/bed and let the other relax toward the floor until stretch is felt through the front of the hip.  Hold-relax: Gently lift the knee of the down leg up ~ 1/2 and inch and hold for 5 seconds, then let it relax for a second and repeat 4 more times.  Stretch: Let the leg relax and feel a stretch across the front of the hip/thigh as you take belly breaths. Hold for __5__ deep belly breaths. Relax. Repeat __2-3__ times per side.   Do this __1-2__ times per day.      Hip Abduction: Side Leg Lift - Side-Lying    Lie on side. Draw lower tummy muscle (TA) and pelvic floor in, Lift top leg until you feel strong contraction of muscle on the side of the hip. Keep top leg straight with body, toes pointing forward. Do not let your hip roll back! Do _10__ reps per set, __3_ sets per day  Hip Extension:    Lying face down, raise leg just off floor squeezing glutes. Keep leg straight. Hold 1 count. Lower leg to floor. Do _10__ reps per set, __3_ sets for each leg. Optional: Place small pillow under abdomen if back becomes uncomfortable.

## 2019-03-23 NOTE — Therapy (Signed)
[place] MAIN [facility] SERVICES [address] [place], [place], [address] Phone: [phone]   Fax:  [phone]  Physical Therapy Treatment  The patient has been informed of current processes in place at Outpatient Rehab to protect patients from Covid-19 exposure including social distancing, schedule modifications, and new cleaning procedures. After discussing their particular risk with a therapist based on the patient's personal risk factors, the patient has decided to proceed with in-person therapy.   Patient Details  Name: Abigail Malone MRN: [id number] Date of Birth: May 19, 1951 Referring Provider (PT): Zara Council   Encounter Date: 03/23/2019  PT End of Session - 03/23/19 1439    Visit Number  12    Number of Visits  18    Date for PT Re-Evaluation  05/04/19    Authorization - Visit Number  2    Authorization - Number of Visits  8    PT Start Time  1100    PT Stop Time  1200    PT Time Calculation (min)  60 min    Activity Tolerance  Patient tolerated treatment well;No increased pain    Behavior During Therapy  WFL for tasks assessed/performed       Past Medical History:  Diagnosis Date  . Abnormal uterine bleeding (AUB) 05/15/2016  . Allergic rhinitis, seasonal 12/09/2014  . Anemia, unspecified 11/18/2013  . HTN (hypertension) 11/18/2013  . Hypertension   . Menopausal symptoms 08/05/2014  . Right knee pain 07/26/2015    Past Surgical History:  Procedure Laterality Date  . COLONOSCOPY  ~2018    There were no vitals filed for this visit.     Pelvic Floor Physical Therapy Treatment Note  SCREENING  Changes in medications, allergies, or medical history?: none   SUBJECTIVE  Patient reports: Is only having nocturia x1 now, no leakage.  Precautions:  none  Pain update: none  Patient Goals: To not have to wear a pad.   OBJECTIVE  Changes in: Gait-assessment: Decreased stride length, greater stance tome  on R>L.   ROM/Mobility: Decreased hip EXT B, L>R  Strength: Pt. Demonstrates difficulty recruiting glutes in tall-kneeling, able to engage in prone with cueing for recruitment.  Abdominal: Greatly improved exhale and contraction of TA but continues to struggle with breathing some, demonstrating decreased inhale and relaxation at today"s visit.      INTERVENTIONS THIS SESSION: NM re-ed: Educated on making sure she feels herself pushing off through the great  toe when walking and taking slightly longer strides to allow for hip EXT functionally and balance posture.  Therex: Reviewed TA/cat-cow in mod-quad to improve relaxation with inhale and improve coordination and recruitment of the TA and diaphragm, Educated on and practiced hip EXT and ABD in lying to improve strength of muscles opposing tight musculature to allow reciprocal inhibition to improve balance of musculature surrounding the pelvis and improve overall posture for optimal musculature length-tension relationship and function. Educated on and practiced hold-relax and hip-flexor stretch to decrease tension and improve length for improved gait mechanics..  Total time: 60 min.                            PT Short Term Goals - 03/16/19 1119      PT SHORT TERM GOAL #1   Title  Patient will demonstrate appropriate application of urge-suppression technique to re-train neurological pathway and decrease urge incontinence for improved QOL.    Baseline  Pt. having to  wear thick pads daily and restriicting activity engagement due to frequent UUI. As of 8/17: Pt. has had no incontinence episode for 1 week but continues to struggle with coordination to allow for optimal use of urge-suppression.    Time  5    Period  Weeks    Status  Achieved    Target Date  04/06/19      PT SHORT TERM GOAL #2   Title  Patient will demonstrate improved pelvic alignment and balance of musculature surrounding the pelvis to facilitate  decreased PFM spasms and decrease pelvic pain.    Baseline  Pt. demonstrates anterior pelvic tilt, LLE long and up-slipped and spasms surrounding pelvis. As of 8/17: RLE measured long by .5cm, heel-lift given.    Time  5    Period  Weeks    Status  Achieved    Target Date  04/06/19      PT SHORT TERM GOAL #3   Title  Patient will report a reduction in pain to no greater than 3/10 over the prior week to demonstrate symptom improvement.    Baseline  6/10 at worst    Time  5    Period  Weeks    Status  Achieved    Target Date  01/26/19        PT Long Term Goals - 03/16/19 1119      PT LONG TERM GOAL #1   Title  Patient will report no episodes of SUIor UUI over the course of the prior two weeks to demonstrate improved functional ability.    Baseline  Pt. wearing heavy pads daily and restricting fluid intake due to significant urinary incontinence. As of 8/17: has gone 1 week with no episodes, not two.    Time  10    Period  Weeks    Status  Achieved      PT LONG TERM GOAL #2   Title  Patient will score at or below 11/300 on the PFDI and 10/100 on the UIQ to demonstrate a clinically meaningful decrease in disability and distress due to pelvic floor dysfunction.    Baseline  PFDI: 56/300, UIQ: 19/100 As of 8/17: PFDI: 23/300, PFIQ: 19/300    Time  10    Period  Weeks    Status  On-going      PT LONG TERM GOAL #3   Title  Patient will describe pain no greater than 1/10 during housekeeping, picking strawberries, playing with grandchildren to demonstrate improved functional ability.    Baseline  Pain 6/10 at worst, aggrivated with lying flat in bed, standing from being bent forward    Time  10    Period  Weeks    Status  Achieved            Plan - 03/23/19 1439    Clinical Impression Statement Pt. Responded well to all interventions today and is showing great symptom improvement, no longer having nocturia>1x/night and feeling more confident with not wearing a pad in public but  she continues to demonstrate muscular imbalance surrounding the pelvis with weak glutes. She responded well to all interventions today, demonstrating improved glute recruitment and hip EXT ROM as well as understanding of all education provided . Continue per POC.   Personal Factors and Comorbidities  Age    Examination-Activity Limitations  Continence    Examination-Participation Restrictions  Church;Shop;Community Activity;Interpersonal Relationship;Cleaning    Stability/Clinical Decision Making  Evolving/Moderate complexity    Rehab Potential  Good  PT Frequency  1x / week    PT Duration  8 weeks    PT Treatment/Interventions  ADLs/Self Care Home Management;Electrical Stimulation;Moist Heat;Traction;Functional mobility training;Gait training;Therapeutic activities;Therapeutic exercise;Neuromuscular re-education;Manual techniques;Patient/family education;Dry needling;Taping;Spinal Manipulations;Joint Manipulations    PT Next Visit Plan  re-assess pelvic alignment with heel-lift, Review gait with heel-lift and better shoes or barefoot. Review glute-strengthening and give tall kneeling sequence.    PT Home Exercise Plan  urge-suppression, seated posture, side-stretch, low back stretch, and seated pelvic tilts, supine adductor stretch, happy-baby, Child's pose, Self external and thumb work, cat-cow, thoracic extensions over towel, sit-to-stand.    Consulted and Agree with Plan of Care  Patient       Patient will benefit from skilled therapeutic intervention in order to improve the following deficits and impairments:  Increased fascial restricitons, Improper body mechanics, Pain, Decreased coordination, Decreased scar mobility, Increased muscle spasms, Postural dysfunction, Decreased activity tolerance, Difficulty walking, Impaired flexibility  Visit Diagnosis: Other lack of coordination  Other muscle spasm  Abnormal posture     Problem List Patient Active Problem List   Diagnosis Date  Noted  . Mixed stress and urge urinary incontinence 04/01/2018  . Abnormal uterine bleeding (AUB) 05/15/2016  . Right knee pain 07/26/2015  . Allergic rhinitis, seasonal 12/09/2014  . Menopausal symptoms 08/05/2014  . HTN (hypertension) 11/18/2013  . Anemia, unspecified 11/18/2013   Cleophus Molt DPT, ATC Cleophus Molt 03/23/2019, 2:42 PM  [place] [facility] MAIN [facility] SERVICES [address] [place], [place], [address] Phone: [phone]   Fax:  [phone]  Name: Ruqayya Thevenin MRN: [id number] Date of Birth: [birth year]

## 2019-04-06 ENCOUNTER — Ambulatory Visit: Payer: PPO

## 2019-04-13 ENCOUNTER — Other Ambulatory Visit: Payer: Self-pay

## 2019-04-13 ENCOUNTER — Ambulatory Visit: Payer: PPO | Attending: Urology

## 2019-04-13 DIAGNOSIS — R293 Abnormal posture: Secondary | ICD-10-CM | POA: Diagnosis not present

## 2019-04-13 DIAGNOSIS — M62838 Other muscle spasm: Secondary | ICD-10-CM | POA: Diagnosis not present

## 2019-04-13 DIAGNOSIS — R278 Other lack of coordination: Secondary | ICD-10-CM | POA: Diagnosis not present

## 2019-04-13 NOTE — Therapy (Signed)
[place] MAIN [facility] SERVICES [address] [place], [place], [address] Phone: [phone]   Fax:  [phone]  Physical Therapy Treatment  The patient has been informed of current processes in place at Outpatient Rehab to protect patients from Covid-19 exposure including social distancing, schedule modifications, and new cleaning procedures. After discussing their particular risk with a therapist based on the patient's personal risk factors, the patient has decided to proceed with in-person therapy.   Patient Details  Name: Abigail Malone MRN: [id number] Date of Birth: [birth year] Referring Provider (PT): Zara Council   Encounter Date: 04/13/2019  PT End of Session - 04/13/19 1157    Visit Number  13    Number of Visits  18    Date for PT Re-Evaluation  05/04/19    Authorization - Visit Number  3    Authorization - Number of Visits  8    PT Start Time  1100    PT Stop Time  1200    PT Time Calculation (min)  60 min    Activity Tolerance  Patient tolerated treatment well;No increased pain    Behavior During Therapy  WFL for tasks assessed/performed       Past Medical History:  Diagnosis Date  . Abnormal uterine bleeding (AUB) 05/15/2016  . Allergic rhinitis, seasonal 12/09/2014  . Anemia, unspecified 11/18/2013  . HTN (hypertension) 11/18/2013  . Hypertension   . Menopausal symptoms 08/05/2014  . Right knee pain 07/26/2015    Past Surgical History:  Procedure Laterality Date  . COLONOSCOPY  ~2018    There were no vitals filed for this visit.   Pelvic Floor Physical Therapy Treatment Note  SCREENING  Changes in medications, allergies, or medical history?: none   SUBJECTIVE  Patient reports: Still not having leakage often, had one accident where she wet her pants a little when she had waited too long. Occasionally gets a burning sensation in her L foot on the top after walking for a long time.   Precautions:   none  Pain update: none  Patient Goals: To not have to wear a pad.   OBJECTIVE  Changes in: Gait-assessment: Decreased hip EXT on L no toe-off, greater stance time on R>L.    Strength: Pt. Demonstrates difficulty recruiting glutes even in prone on L>R, gets cramping sensation in L thigh with prone L hip EXT.  Palpation:  TTP to Left Lumbar erector spinae and multifidus at ~L5, ~L3, elicits an urge to urinate.    INTERVENTIONS THIS SESSION: Manual: Performed TP release and STM to Left Lumbar erector spinae and multifidus at ~L5, ~L3 to decrease spasm and pain and allow for improved balance of musculature for improved function and decreased symptoms. Dry-needle: Performed TPDN with .61mm needle and standard approach as described below to decrease spasm and pain and allow for improved balance of musculature for improved function and decreased symptoms. Therex: reviewed and practiced prone hip EXT to improve strength of muscles opposing tight musculature to allow reciprocal inhibition to improve balance of musculature surrounding the pelvis and improve overall posture for optimal musculature length-tension relationship and function.   Total time: 60 min.                               PT Short Term Goals - 03/16/19 1119      PT SHORT TERM GOAL #1   Title  Patient will demonstrate appropriate application of urge-suppression  technique to re-train neurological pathway and decrease urge incontinence for improved QOL.    Baseline  Pt. having to wear thick pads daily and restriicting activity engagement due to frequent UUI. As of 8/17: Pt. has had no incontinence episode for 1 week but continues to struggle with coordination to allow for optimal use of urge-suppression.    Time  5    Period  Weeks    Status  Achieved    Target Date  04/06/19      PT SHORT TERM GOAL #2   Title  Patient will demonstrate improved pelvic alignment and balance of musculature  surrounding the pelvis to facilitate decreased PFM spasms and decrease pelvic pain.    Baseline  Pt. demonstrates anterior pelvic tilt, LLE long and up-slipped and spasms surrounding pelvis. As of 8/17: RLE measured long by .5cm, heel-lift given.    Time  5    Period  Weeks    Status  Achieved    Target Date  04/06/19      PT SHORT TERM GOAL #3   Title  Patient will report a reduction in pain to no greater than 3/10 over the prior week to demonstrate symptom improvement.    Baseline  6/10 at worst    Time  5    Period  Weeks    Status  Achieved    Target Date  01/26/19        PT Long Term Goals - 04/13/19 1115      PT LONG TERM GOAL #1   Title  Patient will report no episodes of SUIor UUI over the course of the prior two weeks to demonstrate improved functional ability.    Baseline  Pt. wearing heavy pads daily and restricting fluid intake due to significant urinary incontinence. As of 8/17: has gone 1 week with no episodes, not two.    Time  10    Period  Weeks    Status  Achieved      PT LONG TERM GOAL #2   Title  Patient will score at or below 11/300 on the PFDI and 10/100 on the UIQ to demonstrate a clinically meaningful decrease in disability and distress due to pelvic floor dysfunction.    Baseline  PFDI: 56/300, UIQ: 19/100 As of 8/17: PFDI: 23/300, PFIQ: 19/300    Time  10    Period  Weeks    Status  On-going      PT LONG TERM GOAL #3   Title  Patient will describe pain no greater than 1/10 during housekeeping, picking strawberries, playing with grandchildren to demonstrate improved functional ability.    Baseline  Pain 6/10 at worst, aggrivated with lying flat in bed, standing from being bent forward    Time  10    Period  Weeks    Status  Achieved            Plan - 04/13/19 1157    Clinical Impression Statement  Pt. Responded well to all interventions today, demonstrating improved ability to recruit glutes and decreased spasms/cramping as well as  understanding and correct performance of all education and exercises provided today. They will continue to benefit from skilled physical therapy to work toward remaining goals and maximize function as well as decrease likelihood of symptom increase or recurrence.     Personal Factors and Comorbidities  Age    Examination-Activity Limitations  Continence    Examination-Participation Restrictions  Church;Shop;Community Activity;Interpersonal Relationship;Cleaning    Stability/Clinical Decision Making  Evolving/Moderate complexity    Rehab Potential  Good    PT Frequency  1x / week    PT Duration  8 weeks    PT Treatment/Interventions  ADLs/Self Care Home Management;Electrical Stimulation;Moist Heat;Traction;Functional mobility training;Gait training;Therapeutic activities;Therapeutic exercise;Neuromuscular re-education;Manual techniques;Patient/family education;Dry needling;Taping;Spinal Manipulations;Joint Manipulations    PT Next Visit Plan  re-assess pelvic alignment with heel-lift, Review gait with heel-lift and better shoes or barefoot. Review glute-strengthening and give tall kneeling sequence.    PT Home Exercise Plan  urge-suppression, seated posture, side-stretch, low back stretch, and seated pelvic tilts, supine adductor stretch, happy-baby, Child's pose, Self external and thumb work, cat-cow, thoracic extensions over towel, sit-to-stand.    Consulted and Agree with Plan of Care  Patient       Patient will benefit from skilled therapeutic intervention in order to improve the following deficits and impairments:  Increased fascial restricitons, Improper body mechanics, Pain, Decreased coordination, Decreased scar mobility, Increased muscle spasms, Postural dysfunction, Decreased activity tolerance, Difficulty walking, Impaired flexibility  Visit Diagnosis: Other lack of coordination  Other muscle spasm  Abnormal posture     Problem List Patient Active Problem List   Diagnosis Date  Noted  . Mixed stress and urge urinary incontinence 04/01/2018  . Abnormal uterine bleeding (AUB) 05/15/2016  . Right knee pain 07/26/2015  . Allergic rhinitis, seasonal 12/09/2014  . Menopausal symptoms 08/05/2014  . HTN (hypertension) 11/18/2013  . Anemia, unspecified 11/18/2013   Cleophus Molt DPT, ATC Cleophus Molt 04/14/2019, 1:49 PM  [place] [facility] MAIN [facility] SERVICES [address] [place], [place], [address] Phone: [phone]   Fax:  [phone]  Name: Abigail Malone MRN: [id number] Date of Birth: [birth year]

## 2019-04-20 ENCOUNTER — Ambulatory Visit: Payer: PPO

## 2019-04-20 ENCOUNTER — Other Ambulatory Visit: Payer: Self-pay

## 2019-04-20 DIAGNOSIS — M62838 Other muscle spasm: Secondary | ICD-10-CM

## 2019-04-20 DIAGNOSIS — R293 Abnormal posture: Secondary | ICD-10-CM

## 2019-04-20 DIAGNOSIS — R278 Other lack of coordination: Secondary | ICD-10-CM

## 2019-04-20 NOTE — Patient Instructions (Signed)
    This is The QL muscle  To perform release on this muscle, start by getting into this position by bridging the hips up and then slowly lowering your back, then your butt down to lengthen the low back then put the ball under you where you feel the tender spot and roll to the same side slightly to add pressure as needed. Hold still and take deep breaths until the pain is at least 50% less or, ideally, just pressure.   This is your piriformis    To release this muscle start in this position with your ankle crossed over the opposite knee. Place the tennis ball under your buttock where the tender spot is and then slightly roll your weight to the same side to put just enough pressure that it is uncomfortable. Hold and take deep breaths until the pain is at least 50% less or, ideally ,just pressure.   This is your Gluteus Medius and Minimus  To perform release on this muscle, start by getting into this position by bridging the hips up and then slowly lowering your back, then your butt down to lengthen the low back then put the ball under you where you feel the tender spot and roll to the same side slightly to add pressure as needed. Hold still and take deep breaths until the pain is at least 50% less or, ideally, just pressure.   These are your deep hip-flexor muscles. They are easiest to reach where they come together at the hip.   To perform release on this muscle, start by getting into this position by laying on your stomach then put the ball under you where you feel the tender spot and bring the opposite knee up/out to the side to add pressure as needed. Hold still and take deep breaths until the pain is at least 50% less or, ideally ,just pressure.    This is your Lumbar multifidus, They are deep low back muscles.   To perform release on this muscle, start by getting into this position by bridging the hips up and then slowly lowering your back, then your butt down to lengthen the low back then  put the ball under you where you feel the tender spot and roll to the same side slightly to add pressure as needed. Hold still and take deep breaths until the pain is at least 50% less or, ideally, just pressure.   These are your adductors   To perform release on this muscle, start by sitting or lying down in a comfortable position. You can then use your thumb or other tool to apply just enough pressure to cause discomfort and hold. Hold still and take deep breaths until the pain is at least 50% less or, ideally, just pressure.

## 2019-04-20 NOTE — Therapy (Signed)
[place] [facility] MAIN [facility] SERVICES [address] [place], [place], [address] Phone: [phone]   Fax:  [phone]  Physical Therapy Treatment  The patient has been informed of current processes in place at Outpatient Rehab to protect patients from Covid-19 exposure including social distancing, schedule modifications, and new cleaning procedures. After discussing their particular risk with a therapist based on the patient's personal risk factors, the patient has decided to proceed with in-person therapy.   Patient Details  Name: Abigail Malone [name] MRN: [id number] Date of Birth: January 10, 1951 Referring Provider (PT): Michiel CowboyMcGowan, Shannon   Encounter Date: 04/20/2019  PT End of Session - 04/20/19 1629    Visit Number  14    Number of Visits  18    Date for PT Re-Evaluation  05/04/19    Authorization - Visit Number  4    Authorization - Number of Visits  8    PT Start Time  1105    PT Stop Time  1205    PT Time Calculation (min)  60 min    Activity Tolerance  Patient tolerated treatment well;No increased pain    Behavior During Therapy  WFL for tasks assessed/performed       Past Medical History:  Diagnosis Date  . Abnormal uterine bleeding (AUB) 05/15/2016  . Allergic rhinitis, seasonal 12/09/2014  . Anemia, unspecified 11/18/2013  . HTN (hypertension) 11/18/2013  . Hypertension   . Menopausal symptoms 08/05/2014  . Right knee pain 07/26/2015    Past Surgical History:  Procedure Laterality Date  . COLONOSCOPY  ~2018    There were no vitals filed for this visit.   Pelvic Floor Physical Therapy Treatment Note  SCREENING  Changes in medications, allergies, or medical history?: none   SUBJECTIVE  Patient reports: Pt. Has not had any leakage over the past week. Has noticed that she has to do her exercises or she will have just trickling of urine. She washed windows   Precautions:  none  Pain update: none  Patient Goals: To not have to  wear a pad.   OBJECTIVE  Changes in: Gait-assessment: Improved balance of hip EXT with gait, still has some hyperlordosis.   Strength: Pt. Able to perform prone hip EXT appropriately with TC,VC but no strength increase as Pt. Has not been practicing this exercise, trying to do squats (incorrectly) instead.  Palpation:  TTP to Bilateral Lumbar erector spinae and multifidus at ~L5/S1    INTERVENTIONS THIS SESSION: Manual: Performed TP release and STM to Bilateral Lumbar erector spinae and multifidus at ~L5/S1, to decrease spasm and pain and allow for improved balance of musculature for improved function and decreased symptoms. Educated on how to use a tennis ball at home to release Multifidus and self TP release to adductors to decrease tension returning in the PFM and causing urinary hesitancy and UUI. Therex: reviewed and practiced prone hip EXT to improve strength of muscles opposing tight musculature to allow reciprocal inhibition to improve balance of musculature surrounding the pelvis and improve overall posture for optimal musculature length-tension relationship and function.   Total time: 60 min.                            PT Short Term Goals - 03/16/19 1119      PT SHORT TERM GOAL #1   Title  Patient will demonstrate appropriate application of urge-suppression technique to re-train neurological pathway and decrease urge incontinence for improved QOL.  Baseline  Pt. having to wear thick pads daily and restriicting activity engagement due to frequent UUI. As of 8/17: Pt. has had no incontinence episode for 1 week but continues to struggle with coordination to allow for optimal use of urge-suppression.    Time  5    Period  Weeks    Status  Achieved    Target Date  04/06/19      PT SHORT TERM GOAL #2   Title  Patient will demonstrate improved pelvic alignment and balance of musculature surrounding the pelvis to facilitate decreased PFM spasms and  decrease pelvic pain.    Baseline  Pt. demonstrates anterior pelvic tilt, LLE long and up-slipped and spasms surrounding pelvis. As of 8/17: RLE measured long by .5cm, heel-lift given.    Time  5    Period  Weeks    Status  Achieved    Target Date  04/06/19      PT SHORT TERM GOAL #3   Title  Patient will report a reduction in pain to no greater than 3/10 over the prior week to demonstrate symptom improvement.    Baseline  6/10 at worst    Time  5    Period  Weeks    Status  Achieved    Target Date  01/26/19        PT Long Term Goals - 04/20/19 1118      PT LONG TERM GOAL #1   Title  Patient will report no episodes of SUIor UUI over the course of the prior two weeks to demonstrate improved functional ability.    Baseline  Pt. wearing heavy pads daily and restricting fluid intake due to significant urinary incontinence. As of 8/17: has gone 1 week with no episodes, not two. 04/20/19: Had one episode of leakage over the past two weeks.    Time  10    Period  Weeks    Status  Achieved    Target Date  07/13/19      PT LONG TERM GOAL #2   Title  Patient will score at or below 11/300 on the PFDI and 10/100 on the UIQ to demonstrate a clinically meaningful decrease in disability and distress due to pelvic floor dysfunction.    Baseline  PFDI: 56/300, UIQ: 19/100 As of 8/17: PFDI: 23/300, PFIQ: 19/300,    Time  10    Period  Weeks    Status  On-going    Target Date  07/13/19      PT LONG TERM GOAL #3   Title  Patient will describe pain no greater than 1/10 during housekeeping, picking strawberries, playing with grandchildren to demonstrate improved functional ability.    Baseline  Pain 6/10 at worst, aggrivated with lying flat in bed, standing from being bent forward    Time  10    Period  Weeks    Status  Achieved    Target Date  03/02/19            Plan - 04/20/19 1630    Clinical Impression Statement  Pt. Responded well to all interventions today, demonstrating decreased  spasm and pain in the low back and inner thigh as well as understanding and correct performance of all education and exercises provided today. They will continue to benefit from skilled physical therapy to work toward remaining goals and maximize function as well as decrease likelihood of symptom increase or recurrence.     Personal Factors and Comorbidities  Age  Examination-Activity Limitations  Continence    Examination-Participation Restrictions  Church;Shop;Community Activity;Interpersonal Relationship;Cleaning    Stability/Clinical Decision Making  Evolving/Moderate complexity    Rehab Potential  Good    PT Frequency  1x / week    PT Duration  8 weeks    PT Treatment/Interventions  ADLs/Self Care Home Management;Electrical Stimulation;Moist Heat;Traction;Functional mobility training;Gait training;Therapeutic activities;Therapeutic exercise;Neuromuscular re-education;Manual techniques;Patient/family education;Dry needling;Taping;Spinal Manipulations;Joint Manipulations    PT Next Visit Plan  re-assess pelvic alignment with heel-lift, Review gait with heel-lift and better shoes or barefoot. Review glute-strengthening and give tall kneeling sequence.    PT Home Exercise Plan  urge-suppression, seated posture, side-stretch, low back stretch, and seated pelvic tilts, supine adductor stretch, happy-baby, Child's pose, Self external and thumb work, cat-cow, thoracic extensions over towel, sit-to-stand.    Consulted and Agree with Plan of Care  Patient       Patient will benefit from skilled therapeutic intervention in order to improve the following deficits and impairments:  Increased fascial restricitons, Improper body mechanics, Pain, Decreased coordination, Decreased scar mobility, Increased muscle spasms, Postural dysfunction, Decreased activity tolerance, Difficulty walking, Impaired flexibility  Visit Diagnosis: Other lack of coordination  Other muscle spasm  Abnormal  posture     Problem List Patient Active Problem List   Diagnosis Date Noted  . Mixed stress and urge urinary incontinence 04/01/2018  . Abnormal uterine bleeding (AUB) 05/15/2016  . Right knee pain 07/26/2015  . Allergic rhinitis, seasonal 12/09/2014  . Menopausal symptoms 08/05/2014  . HTN (hypertension) 11/18/2013  . Anemia, unspecified 11/18/2013   Willa Rough DPT, ATC Willa Rough 04/20/2019, 4:31 PM  [place] MAIN [facility] SERVICES [address] [place], [place], [address] Phone: [phone]   Fax:  [phone]  Name: Danetta Prom MRN: [id number] Date of Birth: [birth year]

## 2019-04-27 DIAGNOSIS — D649 Anemia, unspecified: Secondary | ICD-10-CM | POA: Diagnosis not present

## 2019-04-27 DIAGNOSIS — I10 Essential (primary) hypertension: Secondary | ICD-10-CM | POA: Diagnosis not present

## 2019-04-27 DIAGNOSIS — N3946 Mixed incontinence: Secondary | ICD-10-CM | POA: Diagnosis not present

## 2019-04-27 DIAGNOSIS — R55 Syncope and collapse: Secondary | ICD-10-CM | POA: Diagnosis not present

## 2019-04-27 DIAGNOSIS — R300 Dysuria: Secondary | ICD-10-CM | POA: Diagnosis not present

## 2019-04-28 ENCOUNTER — Other Ambulatory Visit: Payer: Self-pay

## 2019-04-28 ENCOUNTER — Ambulatory Visit: Payer: PPO | Attending: Urology

## 2019-04-28 DIAGNOSIS — R293 Abnormal posture: Secondary | ICD-10-CM | POA: Insufficient documentation

## 2019-04-28 DIAGNOSIS — M62838 Other muscle spasm: Secondary | ICD-10-CM

## 2019-04-28 DIAGNOSIS — R278 Other lack of coordination: Secondary | ICD-10-CM | POA: Insufficient documentation

## 2019-04-28 NOTE — Patient Instructions (Addendum)
   Look into Dr. Marcelline Mates at [facility] in [place]. [phone]   Hip Abduction: Side Leg Lift - Side-Lying    Lie on side. Draw lower tummy muscle (TA) and pelvic floor in, Lift top leg until you feel strong contraction of muscle on the side of the hip. Keep top leg straight with body, toes pointing forward. Do not let your hip roll back! Do _10__ reps per set, __3_ sets per day

## 2019-04-28 NOTE — Therapy (Signed)
[place] MAIN [facility] SERVICES [address] [place], [place], [address] Phone: [phone]   Fax:  [phone]  Physical Therapy Treatment  The patient has been informed of current processes in place at Outpatient Rehab to protect patients from Covid-19 exposure including social distancing, schedule modifications, and new cleaning procedures. After discussing their particular risk with a therapist based on the patient's personal risk factors, the patient has decided to proceed with in-person therapy.   Patient Details  Name: Abigail Malone MRN: [id number] Date of Birth: [birth year] Referring Provider (PT): Zara Council   Encounter Date: 04/28/2019  PT End of Session - 04/28/19 1317    Visit Number  15    Number of Visits  18    Date for PT Re-Evaluation  05/04/19    Authorization - Visit Number  5    Authorization - Number of Visits  8    PT Start Time  9798    PT Stop Time  1208    PT Time Calculation (min)  54 min    Activity Tolerance  Patient tolerated treatment well;No increased pain    Behavior During Therapy  WFL for tasks assessed/performed       Past Medical History:  Diagnosis Date  . Abnormal uterine bleeding (AUB) 05/15/2016  . Allergic rhinitis, seasonal 12/09/2014  . Anemia, unspecified 11/18/2013  . HTN (hypertension) 11/18/2013  . Hypertension   . Menopausal symptoms 08/05/2014  . Right knee pain 07/26/2015    Past Surgical History:  Procedure Laterality Date  . COLONOSCOPY  ~2018    There were no vitals filed for this visit.      Pelvic Floor Physical Therapy Treatment Note  SCREENING  Changes in medications, allergies, or medical history?: Urine test and blood analysis were done but she has not received results yet.   SUBJECTIVE  Patient reports: Had an incident where she "blacked out" on the way back with from the bathroom. Has also had 2 boils in the vaginal area lately. Thinks she may have an  infection. Had some leakage on Friday, it was a small amount. Feels better when she wears her heel-lift, can tell when she is not wearing it. Has  Pt shared she has been using muscle vibrator on R pectineus to release tension in that area.    Precautions:  none  Pain update: none  Patient Goals: To not have to wear a pad.   OBJECTIVE  Changes in: Gait-assessment: Still has some hyperlordosis.   Strength: Weakness in B hip B ABD   Palpation:  TTP to R>L adductors   INTERVENTIONS THIS SESSION: Manual: Performed TP release with and without myofascial tool and STM to Bilateral pectineus, gracilis and adductor magnus to decrease spasm and pain and allow for improved balance of musculature for improved function and decreased symptoms. (38 minutes)  Therex: Performed side-lying B hip ABD (1x10; requiring tactile and verbal cues for glute med/min engagement and hip alignment, preference for hip flexion engagement) improve strength of muscles opposing tight musculature to allow reciprocal inhibition to improve balance of musculature surrounding the pelvis and improve overall posture for optimal musculature length-tension relationship and function. Pt verbalized understanding, needing minimal to moderate cues to perform correctly. (17 minutes)   Total time:  min. (11:15- 12:10)                              PT Short Term Goals - 03/16/19  1119      PT SHORT TERM GOAL #1   Title  Patient will demonstrate appropriate application of urge-suppression technique to re-train neurological pathway and decrease urge incontinence for improved QOL.    Baseline  Pt. having to wear thick pads daily and restriicting activity engagement due to frequent UUI. As of 8/17: Pt. has had no incontinence episode for 1 week but continues to struggle with coordination to allow for optimal use of urge-suppression.    Time  5    Period  Weeks    Status  Achieved    Target Date   04/06/19      PT SHORT TERM GOAL #2   Title  Patient will demonstrate improved pelvic alignment and balance of musculature surrounding the pelvis to facilitate decreased PFM spasms and decrease pelvic pain.    Baseline  Pt. demonstrates anterior pelvic tilt, LLE long and up-slipped and spasms surrounding pelvis. As of 8/17: RLE measured long by .5cm, heel-lift given.    Time  5    Period  Weeks    Status  Achieved    Target Date  04/06/19      PT SHORT TERM GOAL #3   Title  Patient will report a reduction in pain to no greater than 3/10 over the prior week to demonstrate symptom improvement.    Baseline  6/10 at worst    Time  5    Period  Weeks    Status  Achieved    Target Date  01/26/19        PT Long Term Goals - 04/28/19 1321      PT LONG TERM GOAL #1   Title  Patient will report no episodes of SUIor UUI over the course of the prior two weeks to demonstrate improved functional ability.    Baseline  Pt. wearing heavy pads daily and restricting fluid intake due to significant urinary incontinence. As of 8/17: has gone 1 week with no episodes, not two. 04/20/19: Had one episode of leakage over the past two weeks.    Time  10    Period  Weeks    Status  Achieved    Target Date  07/13/19      PT LONG TERM GOAL #2   Title  Patient will score at or below 11/300 on the PFDI and 10/100 on the UIQ to demonstrate a clinically meaningful decrease in disability and distress due to pelvic floor dysfunction.    Baseline  PFDI: 56/300, UIQ: 19/100 As of 8/17: PFDI: 23/300, PFIQ: 19/300, As of 10/6: PFDI 22/300, PFIQ: 0/300    Time  10    Period  Weeks    Status  On-going    Target Date  07/13/19      PT LONG TERM GOAL #3   Title  Patient will describe pain no greater than 1/10 during housekeeping, picking strawberries, playing with grandchildren to demonstrate improved functional ability.    Baseline  Pain 6/10 at worst, aggrivated with lying flat in bed, standing from being bent forward     Time  10    Period  Weeks    Status  Achieved    Target Date  03/02/19            Plan - 04/28/19 1317    Clinical Impression Statement  Pt. Responded well to all interventions today, demonstrating decreased spasm/ improved fascial mobility and improved performance of Hip ABD exercise with glute med. recruitment as well as understanding and  correct performance of all education and exercises provided today. They will continue to benefit from skilled physical therapy to work toward remaining goals and maximize function as well as decrease likelihood of symptom increase or recurrence.     Personal Factors and Comorbidities  Age    Examination-Activity Limitations  Continence    Examination-Participation Restrictions  Church;Shop;Community Activity;Interpersonal Relationship;Cleaning    Stability/Clinical Decision Making  Evolving/Moderate complexity    Rehab Potential  Good    PT Frequency  1x / week    PT Duration  8 weeks    PT Treatment/Interventions  ADLs/Self Care Home Management;Electrical Stimulation;Moist Heat;Traction;Functional mobility training;Gait training;Therapeutic activities;Therapeutic exercise;Neuromuscular re-education;Manual techniques;Patient/family education;Dry needling;Taping;Spinal Manipulations;Joint Manipulations    PT Next Visit Plan  re-assess pelvic alignment with heel-lift, Review gait with heel-lift and better shoes or barefoot. Review glute-strengthening and give tall kneeling sequence.    PT Home Exercise Plan  urge-suppression, seated posture, side-stretch, low back stretch, and seated pelvic tilts, supine adductor stretch, happy-baby, Child's pose, Self external and thumb work, cat-cow, thoracic extensions over towel, sit-to-stand.    Consulted and Agree with Plan of Care  Patient       Patient will benefit from skilled therapeutic intervention in order to improve the following deficits and impairments:  Increased fascial restricitons, Improper body  mechanics, Pain, Decreased coordination, Decreased scar mobility, Increased muscle spasms, Postural dysfunction, Decreased activity tolerance, Difficulty walking, Impaired flexibility  Visit Diagnosis: Other lack of coordination  Other muscle spasm  Abnormal posture     Problem List Patient Active Problem List   Diagnosis Date Noted  . Mixed stress and urge urinary incontinence 04/01/2018  . Abnormal uterine bleeding (AUB) 05/15/2016  . Right knee pain 07/26/2015  . Allergic rhinitis, seasonal 12/09/2014  . Menopausal symptoms 08/05/2014  . HTN (hypertension) 11/18/2013  . Anemia, unspecified 11/18/2013   Cleophus MoltKeeli T.  DPT, ATC Cleophus MoltKeeli T  04/28/2019, 1:31 PM  [place] [facility] [address] [place], [place], [address] Phone: [phone]   Fax:  [phone]  Name: Abigail Prosehyllis Nora Wessell MRN: [id number] Date of Birth: 1950/08/27

## 2019-05-04 ENCOUNTER — Encounter: Payer: PPO | Admitting: Physical Therapy

## 2019-05-12 ENCOUNTER — Other Ambulatory Visit: Payer: Self-pay

## 2019-05-12 ENCOUNTER — Ambulatory Visit: Payer: PPO

## 2019-05-12 DIAGNOSIS — R293 Abnormal posture: Secondary | ICD-10-CM

## 2019-05-12 DIAGNOSIS — R278 Other lack of coordination: Secondary | ICD-10-CM

## 2019-05-12 DIAGNOSIS — M62838 Other muscle spasm: Secondary | ICD-10-CM

## 2019-05-12 NOTE — Patient Instructions (Signed)
Bracing With Bridging (Hook-Lying)  PUT BAND AROUND KNEES! Keep knees at hip-width.  Exhale and tuck pelvis under, feeling the low back draw into the table. Next, start to roll up off of the table over vertebrae at a time up to the point where if you go further you feel like your back will have to sway. Then rollback down one vertebrae at a time, leaving the bottom to come down last.   Repeat _15x2__ times. Do _1-2__ times a day.  (this replaces your leg raises on your tummy and side!!)   Leaning forward on a counter top, tuck your hips under until you feel the low tummy muscles working and keep the tuck as you bring your hips forward until your shoulder, hip, knee, and ankle are all in a straight line. Hold for ~ 30 sec. Or until your muscles fatigue, before you lose your good form. Repeat until you have done a total of 1:30, in as long or short of holds as is necessary to keep good form.   *work your way to lower surfaces as this becomes easier.   **Make sure to do your cat-cow exercise standing and leaning on a lower surface instead of on your knees to decrease knee irritation.

## 2019-05-12 NOTE — Therapy (Signed)
[place] MAIN [facility] SERVICES [address] [place], [place], [address] Phone: [phone]   Fax:  [phone]  Physical Therapy Treatment  The patient has been informed of current processes in place at Outpatient Rehab to protect patients from Covid-19 exposure including social distancing, schedule modifications, and new cleaning procedures. After discussing their particular risk with a therapist based on the patient's personal risk factors, the patient has decided to proceed with in-person therapy.   Patient Details  Name: Abigail Malone MRN: [id number] Date of Birth: 1951/07/01 Referring Provider (PT): Zara Council   Encounter Date: 05/12/2019  PT End of Session - 05/12/19 1254    Visit Number  16    Number of Visits  18    Date for PT Re-Evaluation  05/04/19    Authorization - Visit Number  6    Authorization - Number of Visits  8    PT Start Time  1100    PT Stop Time  1200    PT Time Calculation (min)  60 min    Activity Tolerance  Patient tolerated treatment well;No increased pain    Behavior During Therapy  WFL for tasks assessed/performed       Past Medical History:  Diagnosis Date  . Abnormal uterine bleeding (AUB) 05/15/2016  . Allergic rhinitis, seasonal 12/09/2014  . Anemia, unspecified 11/18/2013  . HTN (hypertension) 11/18/2013  . Hypertension   . Menopausal symptoms 08/05/2014  . Right knee pain 07/26/2015    Past Surgical History:  Procedure Laterality Date  . COLONOSCOPY  ~2018    There were no vitals filed for this visit.      Pelvic Floor Physical Therapy Treatment Note  SCREENING  Changes in medications, allergies, or medical history?: none   SUBJECTIVE  Patient reports: Has been doing well, has been having knee arthritis pain which is worst in the morning and evening, gets a little irritated getting onto or off of the bed. No leakage since prior visit.  No pain ing hips/back. Wants to learn  what she can do to get longer term improvement because she does well as long as she does exercises daily but wants to be able to do them less often.   Precautions:  none  Pain update: R knee is 4/10 currently, 8/10 at worst (brief), 0/10 at best.  Patient Goals: To not have to wear a pad.   OBJECTIVE  Changes in: Posture/Observation: Hyperlordosis. Balanced stride-length.   Strength: Pt. Able to demonstrate appropriate performance of hip ABD and EXT exercises without cueing, able to recruit glute major and medius for bridge with min. Cueing.    Palpation:  TTP to R>L adductors    INTERVENTIONS THIS SESSION:  Therex: Reviewed side-lying B hip ABD and EXT (1x10) as well as posterior pelvic tilts in hook-lying (1x10) to improve recruitment and awareness of pelvic stabilizers before increasing load then Educated on and practiced pilates-style bridges with band around the knees and counter-top planks to improve strength of muscles opposing tight musculature to allow reciprocal inhibition to improve balance of musculature surrounding the pelvis and improve overall posture for optimal PFM length-tension relationship and function.   Total time:  min. (11:15- 12:10)                            PT Short Term Goals - 03/16/19 1119      PT SHORT TERM GOAL #1   Title  Patient will  demonstrate appropriate application of urge-suppression technique to re-train neurological pathway and decrease urge incontinence for improved QOL.    Baseline  Pt. having to wear thick pads daily and restriicting activity engagement due to frequent UUI. As of 8/17: Pt. has had no incontinence episode for 1 week but continues to struggle with coordination to allow for optimal use of urge-suppression.    Time  5    Period  Weeks    Status  Achieved    Target Date  04/06/19      PT SHORT TERM GOAL #2   Title  Patient will demonstrate improved pelvic alignment and balance of musculature  surrounding the pelvis to facilitate decreased PFM spasms and decrease pelvic pain.    Baseline  Pt. demonstrates anterior pelvic tilt, LLE long and up-slipped and spasms surrounding pelvis. As of 8/17: RLE measured long by .5cm, heel-lift given.    Time  5    Period  Weeks    Status  Achieved    Target Date  04/06/19      PT SHORT TERM GOAL #3   Title  Patient will report a reduction in pain to no greater than 3/10 over the prior week to demonstrate symptom improvement.    Baseline  6/10 at worst    Time  5    Period  Weeks    Status  Achieved    Target Date  01/26/19        PT Long Term Goals - 04/28/19 1321      PT LONG TERM GOAL #1   Title  Patient will report no episodes of SUIor UUI over the course of the prior two weeks to demonstrate improved functional ability.    Baseline  Pt. wearing heavy pads daily and restricting fluid intake due to significant urinary incontinence. As of 8/17: has gone 1 week with no episodes, not two. 04/20/19: Had one episode of leakage over the past two weeks.    Time  10    Period  Weeks    Status  Achieved    Target Date  07/13/19      PT LONG TERM GOAL #2   Title  Patient will score at or below 11/300 on the PFDI and 10/100 on the UIQ to demonstrate a clinically meaningful decrease in disability and distress due to pelvic floor dysfunction.    Baseline  PFDI: 56/300, UIQ: 19/100 As of 8/17: PFDI: 23/300, PFIQ: 19/300, As of 10/6: PFDI 22/300, PFIQ: 0/300    Time  10    Period  Weeks    Status  On-going    Target Date  07/13/19      PT LONG TERM GOAL #3   Title  Patient will describe pain no greater than 1/10 during housekeeping, picking strawberries, playing with grandchildren to demonstrate improved functional ability.    Baseline  Pain 6/10 at worst, aggrivated with lying flat in bed, standing from being bent forward    Time  10    Period  Weeks    Status  Achieved    Target Date  03/02/19            Plan - 05/12/19 1255     Clinical Impression Statement  Pt. Responded well to all interventions today, demonstrating improved recruitment, coordination, and strength of glute Max and med. as well as understanding and correct performance of all education and exercises provided today. They will continue to benefit from skilled physical therapy to work toward remaining goals  and maximize function as well as decrease likelihood of symptom increase or recurrence.     Personal Factors and Comorbidities  Age    Examination-Activity Limitations  Continence    Examination-Participation Restrictions  Church;Shop;Community Activity;Interpersonal Relationship;Cleaning    Stability/Clinical Decision Making  Evolving/Moderate complexity    Rehab Potential  Good    PT Frequency  1x / week    PT Duration  8 weeks    PT Treatment/Interventions  ADLs/Self Care Home Management;Electrical Stimulation;Moist Heat;Traction;Functional mobility training;Gait training;Therapeutic activities;Therapeutic exercise;Neuromuscular re-education;Manual techniques;Patient/family education;Dry needling;Taping;Spinal Manipulations;Joint Manipulations    PT Next Visit Plan  Review glute-strengthening and give modified tall kneeling    PT Home Exercise Plan  urge-suppression, seated posture, side-stretch, low back stretch, and seated pelvic tilts, supine adductor stretch, happy-baby, Child's pose, Self external and thumb work, cat-cow, thoracic extensions over towel, sit-to-stand, Pilates style bridge, counter-top plank.    Consulted and Agree with Plan of Care  Patient       Patient will benefit from skilled therapeutic intervention in order to improve the following deficits and impairments:  Increased fascial restricitons, Improper body mechanics, Pain, Decreased coordination, Decreased scar mobility, Increased muscle spasms, Postural dysfunction, Decreased activity tolerance, Difficulty walking, Impaired flexibility  Visit Diagnosis: Other lack of  coordination  Other muscle spasm  Abnormal posture     Problem List Patient Active Problem List   Diagnosis Date Noted  . Mixed stress and urge urinary incontinence 04/01/2018  . Abnormal uterine bleeding (AUB) 05/15/2016  . Right knee pain 07/26/2015  . Allergic rhinitis, seasonal 12/09/2014  . Menopausal symptoms 08/05/2014  . HTN (hypertension) 11/18/2013  . Anemia, unspecified 11/18/2013   Cleophus MoltKeeli T. Hashim Eichhorst DPT, ATC Cleophus MoltKeeli T Affie Gasner 05/12/2019, 12:59 PM  [place] [facility] [address] [place], [place], [address] Phone: [phone]   Fax:  [phone]  Name: Abigail Malone MRN: [id number] Date of Birth: May 11, 1951

## 2019-05-18 ENCOUNTER — Encounter: Payer: PPO | Admitting: Physical Therapy

## 2019-05-18 DIAGNOSIS — E782 Mixed hyperlipidemia: Secondary | ICD-10-CM | POA: Diagnosis not present

## 2019-05-18 DIAGNOSIS — R55 Syncope and collapse: Secondary | ICD-10-CM

## 2019-05-18 DIAGNOSIS — I10 Essential (primary) hypertension: Secondary | ICD-10-CM | POA: Diagnosis not present

## 2019-05-18 HISTORY — DX: Syncope and collapse: R55

## 2019-05-25 ENCOUNTER — Other Ambulatory Visit: Payer: Self-pay

## 2019-05-25 ENCOUNTER — Ambulatory Visit: Payer: PPO | Attending: Urology

## 2019-05-25 DIAGNOSIS — M62838 Other muscle spasm: Secondary | ICD-10-CM | POA: Insufficient documentation

## 2019-05-25 DIAGNOSIS — R293 Abnormal posture: Secondary | ICD-10-CM | POA: Diagnosis not present

## 2019-05-25 DIAGNOSIS — R278 Other lack of coordination: Secondary | ICD-10-CM | POA: Diagnosis not present

## 2019-05-25 NOTE — Therapy (Signed)
[facility]IN [facility] [address] [place], [place], [address] Phone: [phone]   Fax:  [phone]  Physical Therapy Treatment  The patient has been informed of current processes in place at Outpatient Rehab to protect patients from Covid-19 exposure including social distancing, schedule modifications, and new cleaning procedures. After discussing their particular risk with a therapist based on the patient's personal risk factors, the patient has decided to proceed with in-person therapy.   Patient Details  Name: Abigail Malone MRN: [id number] Date of Birth: 1950/07/24 Referring Provider (PT): Michiel CowboyMcGowan, Shannon   Encounter Date: 05/25/2019  PT End of Session - 05/25/19 1520    Visit Number  17    Number of Visits  18    Date for PT Re-Evaluation  08/17/19    Authorization - Visit Number  7    Authorization - Number of Visits  8    PT Start Time  1100    PT Stop Time  1202    PT Time Calculation (min)  62 min    Activity Tolerance  Patient tolerated treatment well;No increased pain    Behavior During Therapy  WFL for tasks assessed/performed       Past Medical History:  Diagnosis Date  . Abnormal uterine bleeding (AUB) 05/15/2016  . Allergic rhinitis, seasonal 12/09/2014  . Anemia, unspecified 11/18/2013  . HTN (hypertension) 11/18/2013  . Hypertension   . Menopausal symptoms 08/05/2014  . Right knee pain 07/26/2015    Past Surgical History:  Procedure Laterality Date  . COLONOSCOPY  ~2018    There were no vitals filed for this visit.    Pelvic Floor Physical Therapy Treatment Note  SCREENING  Changes in medications, allergies, or medical history?: none   SUBJECTIVE  Patient reports: Has been trying to walk every day for ~ 20 min. But has has leakage after going for a walk a couple times. Is frustrated that she has to do exercises every day to maintain symptom improvement.  Precautions:  none  Pain  update: R knee is 3/10 currently, 10/10 at worst (brief), 0/10 at best.  Patient Goals: To not have to wear a pad. To be able to walk for weight loss without pain or leakage.   OBJECTIVE  Changes in: Posture/Observation: Pt. Demonstrates hyperlordosis when describing how she walks up/down hill    Strength: Pt. Able to demonstrate appropriate performance of hip ABD and EXT exercises without cueing, able to recruit glute major and medius for bridge with min. Cueing.    Palpation:  TTP to R Vastus lateralis, TFL, biceps femoris  Gait assessment:  Pt. Using cane on the incorrect side based on her R knee pain. Hyperlordosis with ascending ramp/hills.  INTERVENTIONS THIS SESSION:  Therex: Reviewed and practiced pilates-style bridges with band around the knees and counter-top planks to improve strength of muscles opposing tight musculature to allow reciprocal inhibition to improve balance of musculature surrounding the pelvis and improve overall posture for optimal PFM length-tension relationship and function. Educated on and practiced seated hamstring stretch and prone quad stretch To maintain and improve muscle length and allow for improved balance of musculature for long-term symptom relief.  Gait training: Educated on how to maintain neutral pelvic tilt with going up and down hills and use of cane in L hand instead of the R to use posture correctly functionally and improve postural imbalances.   Self-care: reviewed goals and updated outcome measures as well as determined new POC.  Total time:  60 min.                               PT Short Term Goals - 05/25/19 1613      PT SHORT TERM GOAL #1   Title  Patient will demonstrate appropriate application of urge-suppression technique to re-train neurological pathway and decrease urge incontinence for improved QOL.    Baseline  Pt. having to wear thick pads daily and restriicting activity engagement due to  frequent UUI. As of 8/17: Pt. has had no incontinence episode for 1 week but continues to struggle with coordination to allow for optimal use of urge-suppression.    Time  5    Period  Weeks    Status  Achieved    Target Date  04/06/19      PT SHORT TERM GOAL #2   Title  Patient will demonstrate improved pelvic alignment and balance of musculature surrounding the pelvis to facilitate decreased PFM spasms and decrease pelvic pain.    Baseline  Pt. demonstrates anterior pelvic tilt, LLE long and up-slipped and spasms surrounding pelvis. As of 8/17: RLE measured long by .5cm, heel-lift given.    Time  5    Period  Weeks    Status  Achieved    Target Date  04/06/19      PT SHORT TERM GOAL #3   Title  Patient will report a reduction in pain to no greater than 3/10 over the prior week to demonstrate symptom improvement.    Baseline  6/10 at worst    Time  5    Period  Weeks    Status  Achieved    Target Date  01/26/19      PT SHORT TERM GOAL #4   Title  Pt. will demonstrate appropriate use of SPC or walking sticks B to allow for appropriate thoracic rotation with gait and decreased muscular imbalance.    Baseline  using cane on the R side, painful knee on the R.    Time  2    Period  Weeks    Status  New    Target Date  06/08/19        PT Long Term Goals - 05/25/19 1606      PT LONG TERM GOAL #1   Title  Patient will report no episodes of SUIor UUI over the course of the prior two weeks to demonstrate improved functional ability.    Baseline  Pt. wearing heavy pads daily and restricting fluid intake due to significant urinary incontinence. As of 8/17: has gone 1 week with no episodes, not two. 04/20/19: Had one episode of leakage over the past two weeks.    Time  10    Period  Weeks    Status  Achieved    Target Date  07/13/19      PT LONG TERM GOAL #2   Title  Patient will score at or below 11/300 on the PFDI and 10/100 on the UIQ to demonstrate a clinically meaningful decrease  in disability and distress due to pelvic floor dysfunction.    Baseline  PFDI: 56/300, UIQ: 19/100 As of 8/17: PFDI: 23/300, PFIQ: 19/300, As of 10/6: PFDI 22/300, PFIQ: 0/300,  As of 05-25-19: PFDI: 21/300    Time  12    Period  Weeks    Status  On-going    Target Date  08/17/19      PT LONG TERM  GOAL #3   Title  Patient will describe pain no greater than 1/10 during housekeeping, picking strawberries, playing with grandchildren to demonstrate improved functional ability.    Baseline  Pain 6/10 at worst, aggrivated with lying flat in bed, standing from being bent forward As of 11-2: Pt. has no LBP but has R knee pain for last 2 visits since increasing walking frequency fr exercise.    Time  12    Period  Weeks    Status  On-going    Target Date  08/17/19            Plan - 05/25/19 1520    Clinical Impression Statement  Pt. has achieved all but one of her original goals with the remaining deficit being occasional leakage following her moring walks. Pt. reports that she has to do her exercises every day or she has leakage. Today's session revealed that walking mechanics are contributing to continued UUI with increased frequency of walking for exercise. She also has started to have R knee pain that is contributing to poor gait mechanics and needs to be addressed to achieve maximal improvement and prevent return of Sx. We also revealed muscular imbalance around the R patella that is likely leading to her R knee pain and is a result of her leg-length discrepancy. We will continue at every-other week for 12 more weeks to build on progress and achieve decreased pain as well as sustained symptom relief of UUI.    Personal Factors and Comorbidities  Age    Examination-Activity Limitations  Continence    Examination-Participation Restrictions  Church;Shop;Community Activity;Interpersonal Relationship;Cleaning    Stability/Clinical Decision Making  Evolving/Moderate complexity    Clinical Decision  Making  Moderate    Rehab Potential  Good    PT Frequency  Biweekly    PT Duration  12 weeks    PT Treatment/Interventions  ADLs/Self Care Home Management;Electrical Stimulation;Moist Heat;Traction;Functional mobility training;Gait training;Therapeutic activities;Therapeutic exercise;Neuromuscular re-education;Manual techniques;Patient/family education;Dry needling;Taping;Spinal Manipulations;Joint Manipulations    PT Next Visit Plan  Review glute-strengthening and give modified tall kneeling, gait training review/ use of cane or walking sticks  manual to R lateral thigh fascia.    PT Home Exercise Plan  urge-suppression, seated posture, side-stretch, low back stretch, and seated pelvic tilts, supine adductor stretch, happy-baby, Child's pose, Self external and thumb work, cat-cow, thoracic extensions over towel, sit-to-stand, Pilates style bridge, counter-top plank.    Consulted and Agree with Plan of Care  Patient       Patient will benefit from skilled therapeutic intervention in order to improve the following deficits and impairments:  Increased fascial restricitons, Improper body mechanics, Pain, Decreased coordination, Decreased scar mobility, Increased muscle spasms, Postural dysfunction, Decreased activity tolerance, Difficulty walking, Impaired flexibility  Visit Diagnosis: Other lack of coordination  Other muscle spasm  Abnormal posture     Problem List Patient Active Problem List   Diagnosis Date Noted  . Mixed stress and urge urinary incontinence 04/01/2018  . Abnormal uterine bleeding (AUB) 05/15/2016  . Right knee pain 07/26/2015  . Allergic rhinitis, seasonal 12/09/2014  . Menopausal symptoms 08/05/2014  . HTN (hypertension) 11/18/2013  . Anemia, unspecified 11/18/2013   Willa Rough DPT, ATC Willa Rough 05/25/2019, 4:35 PM  [place] MAIN [facility] SERVICES [address] [place], [place], [address] Phone: [phone]    Fax:  [phone]  Name: Abigail Malone MRN: [id number] Date of Birth: 1950/09/08

## 2019-05-25 NOTE — Patient Instructions (Signed)
  Hold for 5 deep breaths on each side, repeat 2-3 times per side   Hold for 5 deep breaths on each side, repeat 2-3 times per side    Tuck your hips under, then keep the tuck as you lean forward so you feel a stretch through your low back. Hold for 5 deep breaths, repeat 2-3 times, 1-2 times per day.  DO ALL 3 OF THESE AFTER YOU GO FOR A WALK  Walking: make sure you are not arching your back when you walk up/down hills or are walking in general to allow your back muscles to relax and prevent leakage

## 2019-06-01 ENCOUNTER — Encounter: Payer: PPO | Admitting: Physical Therapy

## 2019-06-08 ENCOUNTER — Ambulatory Visit: Payer: PPO

## 2019-06-08 ENCOUNTER — Other Ambulatory Visit: Payer: Self-pay

## 2019-06-08 DIAGNOSIS — R278 Other lack of coordination: Secondary | ICD-10-CM | POA: Diagnosis not present

## 2019-06-08 DIAGNOSIS — R293 Abnormal posture: Secondary | ICD-10-CM

## 2019-06-08 DIAGNOSIS — M62838 Other muscle spasm: Secondary | ICD-10-CM

## 2019-06-08 NOTE — Therapy (Signed)
[place] MAIN [facility] SERVICES [address] [place], [place], [address] Phone: [phone]   Fax:  [phone]  Physical Therapy Treatment The patient has been informed of current processes in place at Outpatient Rehab to protect patients from Covid-19 exposure including social distancing, schedule modifications, and new cleaning procedures. After discussing their particular risk with a therapist based on the patient's personal risk factors, the patient has decided to proceed with in-person therapy.   Patient Details  Name: Abigail Malone MRN: [id number] Date of Birth: 1950-08-06 Referring Provider (PT): Zara Council   Encounter Date: 06/08/2019  PT End of Session - 06/08/19 1428    Visit Number  18    Number of Visits  23    Date for PT Re-Evaluation  08/17/19    Authorization Time Period  through 08/17/2019    Authorization - Visit Number  1    Authorization - Number of Visits  6    PT Start Time  2878    PT Stop Time  1200    PT Time Calculation (min)  55 min    Activity Tolerance  Patient tolerated treatment well;No increased pain    Behavior During Therapy  WFL for tasks assessed/performed       Past Medical History:  Diagnosis Date  . Abnormal uterine bleeding (AUB) 05/15/2016  . Allergic rhinitis, seasonal 12/09/2014  . Anemia, unspecified 11/18/2013  . HTN (hypertension) 11/18/2013  . Hypertension   . Menopausal symptoms 08/05/2014  . Right knee pain 07/26/2015    Past Surgical History:  Procedure Laterality Date  . COLONOSCOPY  ~2018    There were no vitals filed for this visit.       Pelvic Floor Physical Therapy Treatment Note  SCREENING  Changes in medications, allergies, or medical history?: none   SUBJECTIVE  Patient reports: Has R knee pain. Has been walking on the treadmill with an incline and feels like she can feel her muscles in her tummy pull in better when she is on the treadmill and has not  had any leakage. Didn't walk on Tuesday because it was raining. Walked about 10 min. This morning on the treadmill. Tries to do a HS stretch on the R when she has knee pain, sometimes it helps but it is inconsistent.   Precautions:  none  Pain update: R knee is 5/10 currently, 8/10 at worst (brief), 0/10 at best.   Patient Goals: To not have to wear a pad. To be able to walk for weight loss without pain or leakage.   OBJECTIVE  Changes in: Posture/Observation: Swelling at lateral tibial condyle with pain upon palpation.    Strength: Pt. Able to demonstrate appropriate performance of hip ABD and EXT exercises without cueing, able to recruit glute major and medius for bridge with min. Cueing.    Palpation:  TTP to R Vastus lateralis, TFL, biceps femoris with decreased fascial mobility and report of tingling/ burning sensation with light fascial mobility to lateral thigh on R.   Gait assessment:  Increased stance time on LLE, antalgic gait.   INTERVENTIONS THIS SESSION:  Manual: Performed STM, and MFR to R lateral thigh as well as cupping to the distal IT band near knee to improve fascial mobility and decrease pressure at the IT band insertion to decrease friction and pain.  Gait training: Reviewed spending equal time on both lower extremities following resolution of pain to normalize gait and prevent recurrence of pain.  Total time:  60  min.                           PT Short Term Goals - 05/25/19 1613      PT SHORT TERM GOAL #1   Title  Patient will demonstrate appropriate application of urge-suppression technique to re-train neurological pathway and decrease urge incontinence for improved QOL.    Baseline  Pt. having to wear thick pads daily and restriicting activity engagement due to frequent UUI. As of 8/17: Pt. has had no incontinence episode for 1 week but continues to struggle with coordination to allow for optimal use of urge-suppression.     Time  5    Period  Weeks    Status  Achieved    Target Date  04/06/19      PT SHORT TERM GOAL #2   Title  Patient will demonstrate improved pelvic alignment and balance of musculature surrounding the pelvis to facilitate decreased PFM spasms and decrease pelvic pain.    Baseline  Pt. demonstrates anterior pelvic tilt, LLE long and up-slipped and spasms surrounding pelvis. As of 8/17: RLE measured long by .5cm, heel-lift given.    Time  5    Period  Weeks    Status  Achieved    Target Date  04/06/19      PT SHORT TERM GOAL #3   Title  Patient will report a reduction in pain to no greater than 3/10 over the prior week to demonstrate symptom improvement.    Baseline  6/10 at worst    Time  5    Period  Weeks    Status  Achieved    Target Date  01/26/19      PT SHORT TERM GOAL #4   Title  Pt. will demonstrate appropriate use of SPC or walking sticks B to allow for appropriate thoracic rotation with gait and decreased muscular imbalance.    Baseline  using cane on the R side, painful knee on the R.    Time  2    Period  Weeks    Status  New    Target Date  06/08/19        PT Long Term Goals - 05/25/19 1606      PT LONG TERM GOAL #1   Title  Patient will report no episodes of SUIor UUI over the course of the prior two weeks to demonstrate improved functional ability.    Baseline  Pt. wearing heavy pads daily and restricting fluid intake due to significant urinary incontinence. As of 8/17: has gone 1 week with no episodes, not two. 04/20/19: Had one episode of leakage over the past two weeks.    Time  10    Period  Weeks    Status  Achieved    Target Date  07/13/19      PT LONG TERM GOAL #2   Title  Patient will score at or below 11/300 on the PFDI and 10/100 on the UIQ to demonstrate a clinically meaningful decrease in disability and distress due to pelvic floor dysfunction.    Baseline  PFDI: 56/300, UIQ: 19/100 As of 8/17: PFDI: 23/300, PFIQ: 19/300, As of 10/6: PFDI 22/300,  PFIQ: 0/300,  As of 05-25-19: PFDI: 21/300    Time  12    Period  Weeks    Status  On-going    Target Date  08/17/19      PT LONG TERM GOAL #3   Title  Patient will describe pain no greater than 1/10 during housekeeping, picking strawberries, playing with grandchildren to demonstrate improved functional ability.    Baseline  Pain 6/10 at worst, aggrivated with lying flat in bed, standing from being bent forward As of 11-2: Pt. has no LBP but has R knee pain for last 2 visits since increasing walking frequency fr exercise.    Time  12    Period  Weeks    Status  On-going    Target Date  08/17/19            Plan - 06/08/19 1429    Clinical Impression Statement  Pt. Responded well to all interventions today, demonstrating improved pain from 5/10 to 0/10 and improved gait pattern as well as understanding and correct performance of all education and exercises provided today. They will continue to benefit from skilled physical therapy to work toward remaining goals and maximize function as well as decrease likelihood of symptom increase or recurrence.     Personal Factors and Comorbidities  Age    Examination-Activity Limitations  Continence    Examination-Participation Restrictions  Church;Shop;Community Activity;Interpersonal Relationship;Cleaning    Stability/Clinical Decision Making  Evolving/Moderate complexity    Rehab Potential  Good    PT Frequency  Biweekly    PT Duration  12 weeks    PT Treatment/Interventions  ADLs/Self Care Home Management;Electrical Stimulation;Moist Heat;Traction;Functional mobility training;Gait training;Therapeutic activities;Therapeutic exercise;Neuromuscular re-education;Manual techniques;Patient/family education;Dry needling;Taping;Spinal Manipulations;Joint Manipulations    PT Next Visit Plan  Review glute-strengthening and give modified tall kneeling, gait training review/ use of cane or walking sticks PRN.    PT Home Exercise Plan  urge-suppression,  seated posture, side-stretch, low back stretch, and seated pelvic tilts, supine adductor stretch, happy-baby, Child's pose, Self external and thumb work, cat-cow, thoracic extensions over towel, sit-to-stand, Pilates style bridge, counter-top plank.    Consulted and Agree with Plan of Care  Patient       Patient will benefit from skilled therapeutic intervention in order to improve the following deficits and impairments:  Increased fascial restricitons, Improper body mechanics, Pain, Decreased coordination, Decreased scar mobility, Increased muscle spasms, Postural dysfunction, Decreased activity tolerance, Difficulty walking, Impaired flexibility  Visit Diagnosis: Other lack of coordination  Other muscle spasm  Abnormal posture     Problem List Patient Active Problem List   Diagnosis Date Noted  . Mixed stress and urge urinary incontinence 04/01/2018  . Abnormal uterine bleeding (AUB) 05/15/2016  . Right knee pain 07/26/2015  . Allergic rhinitis, seasonal 12/09/2014  . Menopausal symptoms 08/05/2014  . HTN (hypertension) 11/18/2013  . Anemia, unspecified 11/18/2013   Cleophus Molt DPT, ATC Cleophus Molt 06/08/2019, 3:06 PM  [place] [facility] MAIN [facility] SERVICES [address] [place], [place], [address] Phone: [phone]   Fax:  [phone]  Name: Abigail Malone MRN: [id number] Date of Birth: October 09, 1950

## 2019-06-15 ENCOUNTER — Encounter: Payer: PPO | Admitting: Physical Therapy

## 2019-06-22 ENCOUNTER — Other Ambulatory Visit: Payer: Self-pay

## 2019-06-22 ENCOUNTER — Ambulatory Visit: Payer: PPO

## 2019-06-22 DIAGNOSIS — R293 Abnormal posture: Secondary | ICD-10-CM

## 2019-06-22 DIAGNOSIS — R278 Other lack of coordination: Secondary | ICD-10-CM | POA: Diagnosis not present

## 2019-06-22 DIAGNOSIS — M62838 Other muscle spasm: Secondary | ICD-10-CM

## 2019-06-22 NOTE — Therapy (Signed)
[place] [facility] MAIN [facility] SERVICES [address] [place], [place], [address] Phone: [phone]   Fax:  [phone]  Physical Therapy Treatment  The patient has been informed of current processes in place at Outpatient Rehab to protect patients from Covid-19 exposure including social distancing, schedule modifications, and new cleaning procedures. After discussing their particular risk with a therapist based on the patient's personal risk factors, the patient has decided to proceed with in-person therapy.   Patient Details  Name: Abigail Malone MRN: [id number] Date of Birth: 02/20/1951 Referring Provider (PT): Michiel Cowboy   Encounter Date: 06/22/2019  PT End of Session - 06/22/19 1252    Visit Number  19    Number of Visits  23    Date for PT Re-Evaluation  08/17/19    Authorization Time Period  through 08/17/2019    Authorization - Visit Number  2    Authorization - Number of Visits  6    PT Start Time  1110    PT Stop Time  1210    PT Time Calculation (min)  60 min    Activity Tolerance  Patient tolerated treatment well;No increased pain    Behavior During Therapy  WFL for tasks assessed/performed       Past Medical History:  Diagnosis Date  . Abnormal uterine bleeding (AUB) 05/15/2016  . Allergic rhinitis, seasonal 12/09/2014  . Anemia, unspecified 11/18/2013  . HTN (hypertension) 11/18/2013  . Hypertension   . Menopausal symptoms 08/05/2014  . Right knee pain 07/26/2015    Past Surgical History:  Procedure Laterality Date  . COLONOSCOPY  ~2018    There were no vitals filed for this visit.    Pelvic Floor Physical Therapy Treatment Note  SCREENING  Changes in medications, allergies, or medical history?: none   SUBJECTIVE  Patient reports: R knee is doing well, had some L knee pain following it but that went away as well, she pushed through it but is doing better now. Has not had any leakage since last visit either.  She has been doing her walking on the treadmill slower but with a higher incline, making sure she is leaning into the hill where she feels her lower tummy working. Has started doing standing quad stretch and seated HS stretch for both sides. Still wearing a panty-liner to bed because she sometimes still leaks when rushing to the bathroom at night. She is on Lisinopril with hydrochlorothiazide and the diuretic effect, she had a doctor tell her that she should see if she could come off of the  Hydrochlorothiazide part to help with urge/ leakage.   Precautions:  none  Pain update: No pain in either knee, had L knee pain that prevented her from walking on Thursday. Has 2/10 R shoulder pain when reaching overhead or trying to lay on her R side.   Patient Goals: To not have to wear a pad. To be able to walk for weight loss without pain or leakage.   OBJECTIVE  Changes in: Posture/Observation: R shoulder is more protracted than L in sitting.   Mobility/ROM: Pt. Demonstrating R shoulder pain past ~ 120 degrees of shoulder flexion and ABD pre-treatment with audible "popping".  -able to attain full (compared to L side) ROM following treatment w/o pain or "popping".   Palpation:  TTP to R Subscapularis, Supraspinatus, teres minor, pec major, and upper trap.    Gait assessment:  Increased stance time on LLE, antalgic gait.   INTERVENTIONS THIS SESSION:  Manual: Performed STM, and MFR to R Subscapularis, Supraspinatus, teres minor, pec major, and upper trap followed by grade 3-4 mobs in a caudal direction at the humeral head to improve arthrokinematic motion and decrease muscular imbalance surrounding the R scapula for improved mobility and decreased pain to allow Pt. To sleep on her R side and use appropriate gait mechanics to facilitate improved pelvic alignment and mobility to continue to improve and sustain decreased incontinence and pain.   Gait training: Educated on letting the torso and  hips dissociate with AMB rather than focusing on "swinging" arms with walking to improve transference of energy through the kinetic chain and decrease tension in the back and pelvis.   Total time:  60 min.                            PT Short Term Goals - 05/25/19 1613      PT SHORT TERM GOAL #1   Title  Patient will demonstrate appropriate application of urge-suppression technique to re-train neurological pathway and decrease urge incontinence for improved QOL.    Baseline  Pt. having to wear thick pads daily and restriicting activity engagement due to frequent UUI. As of 8/17: Pt. has had no incontinence episode for 1 week but continues to struggle with coordination to allow for optimal use of urge-suppression.    Time  5    Period  Weeks    Status  Achieved    Target Date  04/06/19      PT SHORT TERM GOAL #2   Title  Patient will demonstrate improved pelvic alignment and balance of musculature surrounding the pelvis to facilitate decreased PFM spasms and decrease pelvic pain.    Baseline  Pt. demonstrates anterior pelvic tilt, LLE long and up-slipped and spasms surrounding pelvis. As of 8/17: RLE measured long by .5cm, heel-lift given.    Time  5    Period  Weeks    Status  Achieved    Target Date  04/06/19      PT SHORT TERM GOAL #3   Title  Patient will report a reduction in pain to no greater than 3/10 over the prior week to demonstrate symptom improvement.    Baseline  6/10 at worst    Time  5    Period  Weeks    Status  Achieved    Target Date  01/26/19      PT SHORT TERM GOAL #4   Title  Pt. will demonstrate appropriate use of SPC or walking sticks B to allow for appropriate thoracic rotation with gait and decreased muscular imbalance.    Baseline  using cane on the R side, painful knee on the R.    Time  2    Period  Weeks    Status  New    Target Date  06/08/19        PT Long Term Goals - 05/25/19 1606      PT LONG TERM GOAL #1   Title   Patient will report no episodes of SUIor UUI over the course of the prior two weeks to demonstrate improved functional ability.    Baseline  Pt. wearing heavy pads daily and restricting fluid intake due to significant urinary incontinence. As of 8/17: has gone 1 week with no episodes, not two. 04/20/19: Had one episode of leakage over the past two weeks.    Time  10    Period  Weeks  Status  Achieved    Target Date  07/13/19      PT LONG TERM GOAL #2   Title  Patient will score at or below 11/300 on the PFDI and 10/100 on the UIQ to demonstrate a clinically meaningful decrease in disability and distress due to pelvic floor dysfunction.    Baseline  PFDI: 56/300, UIQ: 19/100 As of 8/17: PFDI: 23/300, PFIQ: 19/300, As of 10/6: PFDI 22/300, PFIQ: 0/300,  As of 05-25-19: PFDI: 21/300    Time  12    Period  Weeks    Status  On-going    Target Date  08/17/19      PT LONG TERM GOAL #3   Title  Patient will describe pain no greater than 1/10 during housekeeping, picking strawberries, playing with grandchildren to demonstrate improved functional ability.    Baseline  Pain 6/10 at worst, aggrivated with lying flat in bed, standing from being bent forward As of 11-2: Pt. has no LBP but has R knee pain for last 2 visits since increasing walking frequency fr exercise.    Time  12    Period  Weeks    Status  On-going    Target Date  08/17/19            Plan - 06/22/19 1252    Clinical Impression Statement  Pt. Responded well to all interventions today, demonstrating improved R shoulder ROM and resolution of pain both with AROM and when lying on her R side to allow for more diverse sleeping positions and decreased muscular imbalance. She demonstrated understanding of education on exercises and gait with appropriate performance in the clinic. She will continue to benefit from skilled PT to decrease lower crossed imbalance and prevent return of symptoms.    Personal Factors and Comorbidities  Age     Examination-Activity Limitations  Continence    Examination-Participation Restrictions  Church;Shop;Community Activity;Interpersonal Relationship;Cleaning    Stability/Clinical Decision Making  Evolving/Moderate complexity    Rehab Potential  Good    PT Frequency  Biweekly    PT Duration  12 weeks    PT Treatment/Interventions  ADLs/Self Care Home Management;Electrical Stimulation;Moist Heat;Traction;Functional mobility training;Gait training;Therapeutic activities;Therapeutic exercise;Neuromuscular re-education;Manual techniques;Patient/family education;Dry needling;Taping;Spinal Manipulations;Joint Manipulations    PT Next Visit Plan  Review glute-strengthening and give modified tall kneeling, gait training review/ use of cane or walking sticks. re-assess adductors?    PT Home Exercise Plan  urge-suppression, seated posture, side-stretch, low back stretch, and seated pelvic tilts, supine adductor stretch, happy-baby, Child's pose, Self external and thumb work, cat-cow, thoracic extensions over towel, sit-to-stand, Pilates style bridge, counter-top plank, doorway lat and chest stretches.    Consulted and Agree with Plan of Care  Patient       Patient will benefit from skilled therapeutic intervention in order to improve the following deficits and impairments:  Increased fascial restricitons, Improper body mechanics, Pain, Decreased coordination, Decreased scar mobility, Increased muscle spasms, Postural dysfunction, Decreased activity tolerance, Difficulty walking, Impaired flexibility  Visit Diagnosis: Other lack of coordination  Other muscle spasm  Abnormal posture     Problem List Patient Active Problem List   Diagnosis Date Noted  . Mixed stress and urge urinary incontinence 04/01/2018  . Abnormal uterine bleeding (AUB) 05/15/2016  . Right knee pain 07/26/2015  . Allergic rhinitis, seasonal 12/09/2014  . Menopausal symptoms 08/05/2014  . HTN (hypertension) 11/18/2013  . Anemia,  unspecified 11/18/2013   Cleophus MoltKeeli T. Mareena Cavan DPT, ATC Cleophus MoltKeeli T Levert Heslop 06/22/2019, 12:55 PM  [facility] MAIN [facility] SERVICES [address] [place], [place], [address] Phone: [phone]   Fax:  [phone]  Name: Abigail Malone MRN: [id number] Date of Birth: Mar 20, 1951

## 2019-06-22 NOTE — Patient Instructions (Signed)
Urge supression technique:  1) Take a deep breath to convince yourself that you are in control and calm the nervous system.  2) Do 5 "quick-flick" kegels (pelvic floor muscle contractions) and re-assess the urge. Repeat another set if urge is still present. 3) Once the urge has decreased, start walking calmly to the the bathroom. Stop and repeat steps 1 and 2 as many times as needed until you can successfully get to the toilet. 4) Only once seated, take a deep breath and allow the pelvic floor muscles to relax and allow for the urine to flow.    Make sure to do this before you get out of bed  To decrease your night-time leakage.  Also: Prop your feet up for ~ 2 hours before you go to bed to help decrease any swelling and go to the bathroom before going to bed.  CHEST: Doorway, Bilateral - Standing   Both arms if narrow door                       Single arm for wide door  Standing in doorway, place hands and elbows on wall with elbows bent at shoulder height, shoulders down away from ears. Lean forward. Hold for_5_ breaths, repeat 2-3 times, 1-2 times per day.     Hold the doorframe with your fingertips and push your hips away from the frame until you feel a stretch through the side under your arm. Hold for 5 deep breaths and repeat 2-3 times.

## 2019-06-29 ENCOUNTER — Encounter: Payer: PPO | Admitting: Physical Therapy

## 2019-07-06 ENCOUNTER — Other Ambulatory Visit: Payer: Self-pay

## 2019-07-06 ENCOUNTER — Ambulatory Visit: Payer: PPO | Attending: Urology

## 2019-07-06 DIAGNOSIS — M62838 Other muscle spasm: Secondary | ICD-10-CM | POA: Insufficient documentation

## 2019-07-06 DIAGNOSIS — R293 Abnormal posture: Secondary | ICD-10-CM | POA: Diagnosis not present

## 2019-07-06 DIAGNOSIS — R278 Other lack of coordination: Secondary | ICD-10-CM | POA: Diagnosis not present

## 2019-07-06 NOTE — Therapy (Signed)
[place] MAIN [facility] SERVICES [address] [place], [place], [address] Phone: [phone]   Fax:  [phone]  Physical Therapy Treatment  The patient has been informed of current processes in place at Outpatient Rehab to protect patients from Covid-19 exposure including social distancing, schedule modifications, and new cleaning procedures. After discussing their particular risk with a therapist based on the patient's personal risk factors, the patient has decided to proceed with in-person therapy.   Patient Details  Name: Abigail Malone MRN: [id number] Date of Birth: Dec 15, 1950 Referring Provider (PT): Zara Council   Encounter Date: 07/06/2019  PT End of Session - 07/08/19 0834    Visit Number  20    Number of Visits  23    Date for PT Re-Evaluation  08/17/19    Authorization Time Period  through 08/17/2019    Authorization - Visit Number  3    Authorization - Number of Visits  6    PT Start Time  1110    PT Stop Time  1210    PT Time Calculation (min)  60 min    Activity Tolerance  Patient tolerated treatment well;No increased pain    Behavior During Therapy  WFL for tasks assessed/performed       Past Medical History:  Diagnosis Date  . Abnormal uterine bleeding (AUB) 05/15/2016  . Allergic rhinitis, seasonal 12/09/2014  . Anemia, unspecified 11/18/2013  . HTN (hypertension) 11/18/2013  . Hypertension   . Menopausal symptoms 08/05/2014  . Right knee pain 07/26/2015    Past Surgical History:  Procedure Laterality Date  . COLONOSCOPY  ~2018    There were no vitals filed for this visit.    Pelvic Floor Physical Therapy Treatment Note  SCREENING  Changes in medications, allergies, or medical history?: none   SUBJECTIVE  Patient reports: Has been having some "joint pains" mostly in the mornings which lasts ~ 3-4 hours then eases off. Will take 1 Ibuprofen if it does not seem to ease off. The lat stretch has been  helping when she gets pain in the R shoulder and it makes it eases off. No leakage for 1 month! She has been leaning back on the couch and doing lower body crunches to try and "loosen up her knees" but sometimes it irritates her back.  Precautions:  none  Pain update: Current: 0/10 Max: 3-4  Lowest:0/10 Description: achy  Patient Goals: To not have to wear a pad. To be able to walk for weight loss without pain or leakage.   OBJECTIVE  Changes in: Posture/Observation: Some swelling in the R knee   Mobility/ROM: Decreased hip ER on R>L with increased pain in the thigh and knee in FABER position.   Palpation:  TTP to R Piriformis   Gait assessment:  Only heel-striking "when she gets tired" per Pt. Report, when not focusing on heel-striking she takes short steps, takes longer steps when focusing on heel-strike.   INTERVENTIONS THIS SESSION:  Therex: Educated on and practiced modified boat pose in chair and mini-squats as a safe alternative to the reclined lower body crunches Pt. Was doing on her own to improve deep core and glute strength and improve body mechanics to off-load and protect the knees to help Pt. Maintain her physical activity. Educated on and practiced piriformis stretch in hook-lying (due to insufficient ROM in seated) To maintain and improve muscle length and allow for improved balance of musculature for long-term symptom relief. Educated Pt. On how to use  a tennis ball to perform self TP release to her QL and piriformis to decrease spasms and radiating pain into the knees.  Gait training: Cued to feel the heel strike and to feel the toe push off to improve stride length and muscle recruitment for balanced movement and PFM reaction.  Total time:  60 min.                                PT Short Term Goals - 05/25/19 1613      PT SHORT TERM GOAL #1   Title  Patient will demonstrate appropriate application of urge-suppression technique  to re-train neurological pathway and decrease urge incontinence for improved QOL.    Baseline  Pt. having to wear thick pads daily and restriicting activity engagement due to frequent UUI. As of 8/17: Pt. has had no incontinence episode for 1 week but continues to struggle with coordination to allow for optimal use of urge-suppression.    Time  5    Period  Weeks    Status  Achieved    Target Date  04/06/19      PT SHORT TERM GOAL #2   Title  Patient will demonstrate improved pelvic alignment and balance of musculature surrounding the pelvis to facilitate decreased PFM spasms and decrease pelvic pain.    Baseline  Pt. demonstrates anterior pelvic tilt, LLE long and up-slipped and spasms surrounding pelvis. As of 8/17: RLE measured long by .5cm, heel-lift given.    Time  5    Period  Weeks    Status  Achieved    Target Date  04/06/19      PT SHORT TERM GOAL #3   Title  Patient will report a reduction in pain to no greater than 3/10 over the prior week to demonstrate symptom improvement.    Baseline  6/10 at worst    Time  5    Period  Weeks    Status  Achieved    Target Date  01/26/19      PT SHORT TERM GOAL #4   Title  Pt. will demonstrate appropriate use of SPC or walking sticks B to allow for appropriate thoracic rotation with gait and decreased muscular imbalance.    Baseline  using cane on the R side, painful knee on the R.    Time  2    Period  Weeks    Status  New    Target Date  06/08/19        PT Long Term Goals - 05/25/19 1606      PT LONG TERM GOAL #1   Title  Patient will report no episodes of SUIor UUI over the course of the prior two weeks to demonstrate improved functional ability.    Baseline  Pt. wearing heavy pads daily and restricting fluid intake due to significant urinary incontinence. As of 8/17: has gone 1 week with no episodes, not two. 04/20/19: Had one episode of leakage over the past two weeks.    Time  10    Period  Weeks    Status  Achieved     Target Date  07/13/19      PT LONG TERM GOAL #2   Title  Patient will score at or below 11/300 on the PFDI and 10/100 on the UIQ to demonstrate a clinically meaningful decrease in disability and distress due to pelvic floor dysfunction.    Baseline  PFDI:  56/300, UIQ: 19/100 As of 8/17: PFDI: 23/300, PFIQ: 19/300, As of 10/6: PFDI 22/300, PFIQ: 0/300,  As of 05-25-19: PFDI: 21/300    Time  12    Period  Weeks    Status  On-going    Target Date  08/17/19      PT LONG TERM GOAL #3   Title  Patient will describe pain no greater than 1/10 during housekeeping, picking strawberries, playing with grandchildren to demonstrate improved functional ability.    Baseline  Pain 6/10 at worst, aggrivated with lying flat in bed, standing from being bent forward As of 11-2: Pt. has no LBP but has R knee pain for last 2 visits since increasing walking frequency fr exercise.    Time  12    Period  Weeks    Status  On-going    Target Date  08/17/19            Plan - 07/08/19 0834    Clinical Impression Statement  Pt. Responded well to all interventions today, demonstrating improved walking and squatting mechanics, understanding of how to use her deep core and protect her low back, as well as understanding and correct performance of all education and exercises provided today. They will continue to benefit from skilled physical therapy to work toward remaining goals and maximize function as well as decrease likelihood of symptom increase or recurrence.     Personal Factors and Comorbidities  Age    Examination-Activity Limitations  Continence    Examination-Participation Restrictions  Church;Shop;Community Activity;Interpersonal Relationship;Cleaning    Stability/Clinical Decision Making  Evolving/Moderate complexity    Rehab Potential  Good    PT Frequency  Biweekly    PT Duration  12 weeks    PT Treatment/Interventions  ADLs/Self Care Home Management;Electrical Stimulation;Moist  Heat;Traction;Functional mobility training;Gait training;Therapeutic activities;Therapeutic exercise;Neuromuscular re-education;Manual techniques;Patient/family education;Dry needling;Taping;Spinal Manipulations;Joint Manipulations    PT Next Visit Plan  Review glute-strengthening, modified boat pose and mini-squats and review gait training, re-assess adductors?    PT Home Exercise Plan  urge-suppression, seated posture, side-stretch, low back stretch, and seated pelvic tilts, supine adductor stretch, happy-baby, Child's pose, Self external and thumb work, cat-cow, thoracic extensions over towel, sit-to-stand, Pilates style bridge, counter-top plank, doorway lat and chest stretches.    Consulted and Agree with Plan of Care  Patient       Patient will benefit from skilled therapeutic intervention in order to improve the following deficits and impairments:  Increased fascial restricitons, Improper body mechanics, Pain, Decreased coordination, Decreased scar mobility, Increased muscle spasms, Postural dysfunction, Decreased activity tolerance, Difficulty walking, Impaired flexibility  Visit Diagnosis: Other lack of coordination  Other muscle spasm  Abnormal posture     Problem List Patient Active Problem List   Diagnosis Date Noted  . Mixed stress and urge urinary incontinence 04/01/2018  . Abnormal uterine bleeding (AUB) 05/15/2016  . Right knee pain 07/26/2015  . Allergic rhinitis, seasonal 12/09/2014  . Menopausal symptoms 08/05/2014  . HTN (hypertension) 11/18/2013  . Anemia, unspecified 11/18/2013   Cleophus MoltKeeli T. Hien Perreira DPT, ATC Cleophus MoltKeeli T Danyl Deems 07/08/2019, 8:44 AM  [place] [facility]IN [facility] [address] [place], [place], [id number] Phone: [phone]   Fax:  [phone]  Name: Abigail Malone MRN: [id number] Date of Birth: Aug 13, 1950

## 2019-07-06 NOTE — Patient Instructions (Signed)
  ONLY GO 1/2 as deep as the chair height! Make sure knees are in line with toes. Don't press your chest out in-between repetitions.  *If your knee hurts with this, you are not doing it right.  Keep your trunk as one unit and let it hinge forward from the hips as you push your bottom back and bend your knees at the same rate that you bend your hips. Keep your weight back toward your heels but do not actually lift the toes off the ground. Exhale starting just before and all the way through standing to help engage the glutes and lower tummy muscles. Do 2x 10, at least once per day (or on commercial breaks)  Seated lean-backs:  **SIT IN CHAIR or ground.   Tuck your pelvis under (dray your pubic bone up to your ribs) and hold that tuck and lean back until you feel your lower tummy working hard to hold you but not so far that your low back arches. Hold for 15-30 sec. Repeat 5 times, once a day (while watching TV, etc)   Hold this position and take deep breaths, letting every thing relax. Hold for 5 deep breaths and then switch sides.  Repeat 2-3 times on each side. Do this at least once per day.     This is The QL muscle  To perform release on this muscle, start by getting into this position by bridging the hips up and then slowly lowering your back, then your butt down to lengthen the low back then put the ball under you where you feel the tender spot and roll to the same side slightly to add pressure as needed. Hold still and take deep breaths until the pain is at least 50% less or, ideally, just pressure.   This is your piriformis    To release this muscle start in this position with your ankle crossed over the opposite knee. Place the tennis ball under your buttock where the tender spot is and then slightly roll your weight to the same side to put just enough pressure that it is uncomfortable. Hold and take deep breaths until the pain is at least 50% less or, ideally ,just pressure.

## 2019-07-08 ENCOUNTER — Other Ambulatory Visit: Payer: Self-pay | Admitting: Internal Medicine

## 2019-07-08 DIAGNOSIS — Z1231 Encounter for screening mammogram for malignant neoplasm of breast: Secondary | ICD-10-CM

## 2019-07-13 ENCOUNTER — Encounter: Payer: PPO | Admitting: Physical Therapy

## 2019-07-20 ENCOUNTER — Ambulatory Visit: Payer: PPO

## 2019-07-20 ENCOUNTER — Other Ambulatory Visit: Payer: Self-pay

## 2019-07-20 DIAGNOSIS — R293 Abnormal posture: Secondary | ICD-10-CM

## 2019-07-20 DIAGNOSIS — R278 Other lack of coordination: Secondary | ICD-10-CM

## 2019-07-20 DIAGNOSIS — M62838 Other muscle spasm: Secondary | ICD-10-CM

## 2019-07-20 NOTE — Patient Instructions (Signed)
  Keep doing all exercises for 1 more month ar 3-4 times per week. Then decrease to 2-3 times per week to maintain.   1) With your "lean-backs" in the chair or on the sofa, remember to TUCK UNDER and then only lean back a little until you feel the muscles in your tummy working hard, then hold still (at least ~ 3-5 seconds) or until you get tired, come forward to rest and repeat for 5-10 times.  2) With your planks, do these on a chair instead of the bed. If it gets easy, try to do them on the ground (on toes, not knees to prevent knee pain) but do shorter hold.    Start on all fours, tuck your hips under slightly and come forward until you are in a straight line. Make sure that your knees, your hips, and your shoulders are in a straight line. Hold for ~ 30 seconds, (or until you feel like you are too tired to hold it correctly). Repeat 2-3 times (or up to ~ 1:30 sec. Total).   As you get stronger, you can lengthen each hold. When you can hold for ~ 1:30 straight, try switching to on your toes instead of knees. You will need to decrease your hold time initially and build back up to the full 1:30.   Always do these at least 2 times per week, or as needed:  1) low back stretch 2) plank on a chair 3) side-stretch 4) butterfly stretch 5) hip-flexor stretch (leg off the side of the bed) 6) mini-squats

## 2019-07-20 NOTE — Therapy (Signed)
[place] [facility] SERVICES [address] [place], [place], [address] Phone: [phone]   Fax:  [phone]  Physical Therapy Treatment and Discharge Summary  The patient has been informed of current processes in place at Outpatient Rehab to protect patients from Covid-19 exposure including social distancing, schedule modifications, and new cleaning procedures. After discussing their particular risk with a therapist based on the patient's personal risk factors, the patient has decided to proceed with in-person therapy.   Patient Details  Name: Abigail Malone MRN: [id number] Date of Birth: [birth year] Referring Provider (PT): Zara Council   Encounter Date: 07/20/2019  PT End of Session - 07/20/19 1551    Visit Number  21    Number of Visits  23    Date for PT Re-Evaluation  08/17/19    Authorization Time Period  through 08/17/2019    Authorization - Visit Number  4    Authorization - Number of Visits  6    PT Start Time  9833    PT Stop Time  8250    PT Time Calculation (min)  40 min    Activity Tolerance  Patient tolerated treatment well;No increased pain    Behavior During Therapy  WFL for tasks assessed/performed       Past Medical History:  Diagnosis Date  . Abnormal uterine bleeding (AUB) 05/15/2016  . Allergic rhinitis, seasonal 12/09/2014  . Anemia, unspecified 11/18/2013  . HTN (hypertension) 11/18/2013  . Hypertension   . Menopausal symptoms 08/05/2014  . Right knee pain 07/26/2015    Past Surgical History:  Procedure Laterality Date  . COLONOSCOPY  ~2018    There were no vitals filed for this visit.   Pelvic Floor Physical Therapy Treatment and Discharge Note  SCREENING  Changes in medications, allergies, or medical history?: none   SUBJECTIVE  Patient reports: She is doing really well, not having leakage while walking. Knee has not bothered her much, a little discomfort when she squats to clean out the  fireplace. Is not having much pain at all. Walking ~ 20 min. At a time. Is not doing tennis ball release much because it is so uncomfortable.  Precautions:  none  Pain update: Current: 0/10 Max: 0/10  Lowest:0/10 Description: achy  Patient Goals: To not have to wear a pad. To be able to walk for weight loss without pain or leakage.   OBJECTIVE  Changes in: Posture/Observation: Walking with her purse on R shoulder makes her Lean left.  Mobility/ROM: Decreased hip ER on R>L with increased pain in the thigh and knee in FABER position.   Palpation:  TTP to R Piriformis   Gait assessment:  Pt. Demonstrated improved walking mechanics when she changed her purse to the sling-strap  INTERVENTIONS THIS SESSION:  Therex: Reviewed modified boat pose in chair and mini-squats as well as gave exercise progression information and how to decrease frequency of exercise to maintain improvement of Sx.  Self-care: reviewed and updated goals, Reviewed when to do tennis ball release and how it plays into managing her pain/preventing it returning but is not necessary every day.   Total time:  40 min.                             PT Short Term Goals - 07/20/19 1116      PT SHORT TERM GOAL #1   Title  Patient will demonstrate appropriate application of urge-suppression technique to  re-train neurological pathway and decrease urge incontinence for improved QOL.    Baseline  Pt. having to wear thick pads daily and restriicting activity engagement due to frequent UUI. As of 8/17: Pt. has had no incontinence episode for 1 week but continues to struggle with coordination to allow for optimal use of urge-suppression.    Time  5    Period  Weeks    Status  Achieved    Target Date  04/06/19      PT SHORT TERM GOAL #2   Title  Patient will demonstrate improved pelvic alignment and balance of musculature surrounding the pelvis to facilitate decreased PFM spasms and decrease  pelvic pain.    Baseline  Pt. demonstrates anterior pelvic tilt, LLE long and up-slipped and spasms surrounding pelvis. As of 8/17: RLE measured long by .5cm, heel-lift given.    Time  5    Period  Weeks    Status  Achieved    Target Date  04/06/19      PT SHORT TERM GOAL #3   Title  Patient will report a reduction in pain to no greater than 3/10 over the prior week to demonstrate symptom improvement.    Baseline  6/10 at worst    Time  5    Period  Weeks    Status  Achieved    Target Date  01/26/19      PT SHORT TERM GOAL #4   Title  Pt. will demonstrate appropriate use of SPC or walking sticks B to allow for appropriate thoracic rotation with gait and decreased muscular imbalance.    Baseline  using cane on the R side, painful knee on the R.    Time  2    Period  Weeks    Status  Achieved    Target Date  06/08/19        PT Long Term Goals - 07/20/19 1119      PT LONG TERM GOAL #1   Title  Patient will report no episodes of SUIor UUI over the course of the prior two weeks to demonstrate improved functional ability.    Baseline  Pt. wearing heavy pads daily and restricting fluid intake due to significant urinary incontinence. As of 8/17: has gone 1 week with no episodes, not two. 04/20/19: Had one episode of leakage over the past two weeks.    Time  10    Period  Weeks    Status  Achieved    Target Date  07/13/19      PT LONG TERM GOAL #2   Title  Patient will score at or below 11/300 on the PFDI and 10/100 on the UIQ to demonstrate a clinically meaningful decrease in disability and distress due to pelvic floor dysfunction.    Baseline  PFDI: 56/300, UIQ: 19/100 As of 8/17: PFDI: 23/300, PFIQ: 19/300, As of 10/6: PFDI 22/300, PFIQ: 0/300,  As of 05-25-19: PFDI: 21/300 As of 12/28: PFDI: 0/300, PFIQ: 0/300    Time  12    Period  Weeks    Status  Achieved    Target Date  08/17/19      PT LONG TERM GOAL #3   Title  Patient will describe pain no greater than 1/10 during  housekeeping, picking strawberries, playing with grandchildren to demonstrate improved functional ability.    Baseline  Pain 6/10 at worst, aggrivated with lying flat in bed, standing from being bent forward As of 11-2: Pt. has no LBP but has  R knee pain for last 2 visits since increasing walking frequency fr exercise.    Time  12    Period  Weeks    Status  Achieved    Target Date  08/17/19            Plan - 07/20/19 1125    Clinical Impression Statement  Pt. has met all goals and has demonstrated proficiency with her HEP and other educaton items including gait and body mechanics for functional tasks. She responded well to all education today and demonstrates understanding of how to continue to maintain improvement and taper her exercise frequency as well as increase strengthening difficulty. She will be discharged today to continue at home with her HEP.    Personal Factors and Comorbidities  Age    Examination-Activity Limitations  Continence    Examination-Participation Restrictions  Church;Shop;Community Activity;Interpersonal Relationship;Cleaning    Stability/Clinical Decision Making  Evolving/Moderate complexity    Rehab Potential  Good    PT Frequency  --    PT Duration  --    PT Treatment/Interventions  ADLs/Self Care Home Management;Electrical Stimulation;Moist Heat;Traction;Functional mobility training;Gait training;Therapeutic activities;Therapeutic exercise;Neuromuscular re-education;Manual techniques;Patient/family education;Dry needling;Taping;Spinal Manipulations;Joint Manipulations    PT Next Visit Plan  D/C    PT Home Exercise Plan  urge-suppression, seated posture, side-stretch, low back stretch, and seated pelvic tilts, supine adductor stretch, happy-baby, Child's pose, Self external and thumb work, cat-cow, thoracic extensions over towel, sit-to-stand, Pilates style bridge, counter-top plank, doorway lat and chest stretches.    Consulted and Agree with Plan of Care   Patient       Patient will benefit from skilled therapeutic intervention in order to improve the following deficits and impairments:  Increased fascial restricitons, Improper body mechanics, Pain, Decreased coordination, Decreased scar mobility, Increased muscle spasms, Postural dysfunction, Decreased activity tolerance, Difficulty walking, Impaired flexibility  Visit Diagnosis: Other lack of coordination  Other muscle spasm  Abnormal posture     Problem List Patient Active Problem List   Diagnosis Date Noted  . Mixed stress and urge urinary incontinence 04/01/2018  . Abnormal uterine bleeding (AUB) 05/15/2016  . Right knee pain 07/26/2015  . Allergic rhinitis, seasonal 12/09/2014  . Menopausal symptoms 08/05/2014  . HTN (hypertension) 11/18/2013  . Anemia, unspecified 11/18/2013   Willa Rough DPT, ATC Willa Rough 07/20/2019, 4:04 PM  [place] [facility] SERVICES [address] [place], [place], [address] Phone: [phone]   Fax:  [phone]  Name: Abigail Malone MRN: [id number] Date of Birth: January 30, 1951

## 2019-08-11 ENCOUNTER — Ambulatory Visit
Admission: RE | Admit: 2019-08-11 | Discharge: 2019-08-11 | Disposition: A | Discharge status: Home / Self Care | Payer: PPO | Source: Ambulatory Visit | Attending: Internal Medicine | Admitting: Internal Medicine

## 2019-08-11 ENCOUNTER — Other Ambulatory Visit: Payer: Self-pay

## 2019-08-11 DIAGNOSIS — Z1231 Encounter for screening mammogram for malignant neoplasm of breast: Secondary | ICD-10-CM

## 2019-09-04 ENCOUNTER — Ambulatory Visit: Payer: PPO | Attending: Internal Medicine

## 2019-09-04 ENCOUNTER — Other Ambulatory Visit: Payer: Self-pay

## 2019-09-04 DIAGNOSIS — Z23 Encounter for immunization: Secondary | ICD-10-CM

## 2019-09-04 NOTE — Progress Notes (Signed)
   Covid-19 Vaccination Clinic  Name:  Abigail Malone    MRN: [id number] DOB: [birth year]  09/04/2019  [name] was observed post Covid-19 immunization for 15 minutes without incidence. She was provided with Vaccine Information Sheet and instruction to access the V-Safe system.   [name] was instructed to call 911 with any severe reactions post vaccine: Marland Kitchen Difficulty breathing  . Swelling of your face and throat  . A fast heartbeat  . A bad rash all over your body  . Dizziness and weakness    Immunizations Administered    Name Date Dose VIS Date Route   Moderna COVID-19 Vaccine 09/04/2019  9:47 AM 0.5 mL 06/23/2019 Intramuscular   Manufacturer: Moderna   Lot: [id number]   NDC: 66060-045-99

## 2019-10-05 ENCOUNTER — Ambulatory Visit: Payer: PPO | Attending: Internal Medicine

## 2019-10-05 DIAGNOSIS — Z23 Encounter for immunization: Secondary | ICD-10-CM

## 2019-10-05 NOTE — Progress Notes (Signed)
   Covid-19 Vaccination Clinic  Name:  Lajoy Vanamburg    MRN: [id number] DOB: 06-20-1951  10/05/2019  Ms. Franey was observed post Covid-19 immunization for 15 minutes without incident. She was provided with Vaccine Information Sheet and instruction to access the V-Safe system.   Ms. Henault was instructed to call 911 with any severe reactions post vaccine: Marland Kitchen Difficulty breathing  . Swelling of face and throat  . A fast heartbeat  . A bad rash all over body  . Dizziness and weakness   Immunizations Administered    Name Date Dose VIS Date Route   Moderna COVID-19 Vaccine 10/05/2019  9:58 AM 0.5 mL 06/23/2019 Intramuscular   Manufacturer: Moderna   Lot: [id number]   NDC: 55208-022-33

## 2020-01-01 DIAGNOSIS — E782 Mixed hyperlipidemia: Secondary | ICD-10-CM | POA: Diagnosis not present

## 2020-01-01 DIAGNOSIS — Z79899 Other long term (current) drug therapy: Secondary | ICD-10-CM | POA: Diagnosis not present

## 2020-01-01 DIAGNOSIS — E669 Obesity, unspecified: Secondary | ICD-10-CM | POA: Insufficient documentation

## 2020-01-01 DIAGNOSIS — E66812 Obesity, class 2: Secondary | ICD-10-CM | POA: Insufficient documentation

## 2020-01-01 DIAGNOSIS — N3946 Mixed incontinence: Secondary | ICD-10-CM | POA: Diagnosis not present

## 2020-01-01 DIAGNOSIS — D649 Anemia, unspecified: Secondary | ICD-10-CM | POA: Diagnosis not present

## 2020-01-01 DIAGNOSIS — R6889 Other general symptoms and signs: Secondary | ICD-10-CM | POA: Diagnosis not present

## 2020-01-01 DIAGNOSIS — R55 Syncope and collapse: Secondary | ICD-10-CM | POA: Diagnosis not present

## 2020-01-01 DIAGNOSIS — Z0000 Encounter for general adult medical examination without abnormal findings: Secondary | ICD-10-CM | POA: Diagnosis not present

## 2020-01-01 DIAGNOSIS — I10 Essential (primary) hypertension: Secondary | ICD-10-CM | POA: Diagnosis not present

## 2020-01-01 DIAGNOSIS — J302 Other seasonal allergic rhinitis: Secondary | ICD-10-CM | POA: Diagnosis not present

## 2020-01-01 DIAGNOSIS — E66811 Obesity, class 1: Secondary | ICD-10-CM | POA: Insufficient documentation

## 2020-01-01 DIAGNOSIS — R5383 Other fatigue: Secondary | ICD-10-CM | POA: Diagnosis not present

## 2020-01-01 DIAGNOSIS — I739 Peripheral vascular disease, unspecified: Secondary | ICD-10-CM | POA: Diagnosis not present

## 2020-01-08 ENCOUNTER — Other Ambulatory Visit: Payer: Self-pay | Admitting: Internal Medicine

## 2020-01-08 DIAGNOSIS — I739 Peripheral vascular disease, unspecified: Secondary | ICD-10-CM

## 2020-01-15 ENCOUNTER — Ambulatory Visit
Admission: RE | Admit: 2020-01-15 | Discharge: 2020-01-15 | Disposition: A | Discharge status: Home / Self Care | Payer: PPO | Source: Ambulatory Visit | Attending: Internal Medicine | Admitting: Internal Medicine

## 2020-01-15 ENCOUNTER — Other Ambulatory Visit: Payer: Self-pay

## 2020-01-15 DIAGNOSIS — I10 Essential (primary) hypertension: Secondary | ICD-10-CM | POA: Diagnosis not present

## 2020-01-15 DIAGNOSIS — I739 Peripheral vascular disease, unspecified: Secondary | ICD-10-CM

## 2020-02-25 ENCOUNTER — Other Ambulatory Visit: Payer: Self-pay

## 2020-02-25 ENCOUNTER — Ambulatory Visit (INDEPENDENT_AMBULATORY_CARE_PROVIDER_SITE_OTHER): Payer: PPO | Admitting: Urology

## 2020-02-25 VITALS — BP 90/48 | HR 69 | Ht 59.0 in | Wt 160.0 lb

## 2020-02-25 DIAGNOSIS — N3946 Mixed incontinence: Secondary | ICD-10-CM

## 2020-02-25 LAB — BLADDER SCAN AMB NON-IMAGING: Scan Result: 99

## 2020-02-25 MED ORDER — OXYBUTYNIN CHLORIDE ER 15 MG PO TB24: 15.0000 mg | ORAL_TABLET | Freq: Every day | ORAL | 3 refills | Status: DC

## 2020-02-25 NOTE — Progress Notes (Signed)
02/25/2020 11:00 AM   Abigail Malone [birth year] [id number]  Referring provider: Mickey Farber, MD [address] [facility] [place],  [place] [address]  Chief Complaint  Patient presents with  . Urinary Incontinence    HPI: 69 year old female referred back for further management of mixed urinary incontinence.  She mentioned to her primary care that she has been having increased leakage with sneezing as she has had a lot of allergies this summer exacerbating her chronic SUI.  She was referred back to see [place].  She last seen in February 2020 by Abigail Malone.  Please see previous notes for details.  At that time, she was referred to PT, resumed on topical estrogen.  She tried and failed Myrbetriq.  She worked with physical therapy up until December 2020.  She had excellent response with almost complete resolution of her stress urinary incontinence and dramatic improvement of her urgency and urge incontinence symptoms.  Her insurance will not pay for any more physical therapy sessions at this time per her knowledge.  Today, she reports that she is not been doing her exercises as routine Abigail Malone as she used to.  Her stress urinary continence has returned and so has her urgency and urge incontinence.  She will feel the urge to go the bathroom and then she will feel urine trickle down her leg.  She is currently not on any medications for OAB.   PVR 99 cc   PMH: Past Medical History:  Diagnosis Date  . Abnormal uterine bleeding (AUB) 05/15/2016  . Allergic rhinitis, seasonal 12/09/2014  . Anemia, unspecified 11/18/2013  . HTN (hypertension) 11/18/2013  . Hypertension   . Menopausal symptoms 08/05/2014  . Right knee pain 07/26/2015    Surgical History: Past Surgical History:  Procedure Laterality Date  . COLONOSCOPY  ~2018    Home Medications:  Allergies as of 02/25/2020      Reactions   Amlodipine    Other reaction(s): Other (See Comments) Leg cramping        Medication List       Accurate as of February 25, 2020 11:00 AM. If you have any questions, ask your nurse or doctor.        STOP taking these medications   estradiol 0.1 MG/GM vaginal cream Commonly known as: ESTRACE VAGINAL Stopped by: Abigail Scotland, MD   mirabegron ER 50 MG Tb24 tablet Commonly known as: Myrbetriq Stopped by: Abigail Scotland, MD     TAKE these medications   amLODipine 5 MG tablet Commonly known as: NORVASC Take 5 mg by mouth daily. Pt has not been taken from past 2 week giving tingling and cramping so stop taking it.   conjugated estrogens vaginal cream Commonly known as: Premarin Apply 0.5mg  (pea-sized amount)  just inside the vaginal introitus with a finger-tip on  Monday, Wednesday and Friday nights.   lisinopril-hydrochlorothiazide 20-12.5 MG tablet Commonly known as: ZESTORETIC Take by mouth.   oxybutynin 15 MG 24 hr tablet Commonly known as: DITROPAN XL Take 1 tablet (15 mg total) by mouth daily. Started by: Abigail Scotland, MD       Allergies:  Allergies  Allergen Reactions  . Amlodipine     Other reaction(s): Other (See Comments) Leg cramping    Family History: Family History  Problem Relation Age of Onset  . Hematuria Mother   . Liver cancer Mother   . Prostate cancer Father   . Kidney failure Brother   . Breast cancer Neg Hx  Social History:  reports that she quit smoking about 13 years ago. She has a 4.00 pack-year smoking history. She has never used smokeless tobacco. She reports current alcohol use. She reports that she does not use drugs.   Physical Exam: BP (!) 90/48   Pulse 69   Ht 4\' 11"  (1.499 m)   Wt 160 lb (72.6 kg)   BMI 32.32 kg/m   Constitutional:  Alert and oriented, No acute distress. HEENT: [place] AT, moist mucus membranes.  Trachea midline, no masses. Cardiovascular: No clubbing, cyanosis, or edema. Skin: No rashes, bruises or suspicious lesions. Neurologic: Grossly intact, no focal deficits, moving all  4 extremities. Psychiatric: Normal mood and affect.   Urinalysis UA today reviewed, negative   Assessment & Plan:    1. Mixed incontinence Stress urinary incontinence exacerbated by allergies-recommend resuming exercises learned at physical therapy as this was very beneficial in the past  In addition to above, she is no longer taking any medication for urge urinary incontinence.  Per documentation, it appears that she tried Myrbetriq 25 mg, 50 mg and Vesicare in the past.  None of these were particularly effective.  Trial of oxybutynin 15 mg XL today.  Discussed possible flat side effects.  We will have her follow-up next month with Abigail Malone.  If her symptoms persist or fail to improve, she may be a good candidate for consideration of Botox and or PTNS as alternatives i.e. third line therapy.  - Urinalysis, Complete - Bladder Scan (Post Void Residual) in office   1 month Abigail Malone  Abigail SAGINAW, MD  [facility] [address], [address] [place], [place] [phone]  I spent 30 total minutes on the day of the encounter including pre-visit review of the medical record, face-to-face time with the patient, and post visit ordering of labs/imaging/tests.

## 2020-02-26 LAB — URINALYSIS, COMPLETE
Bilirubin, UA: NEGATIVE
Glucose, UA: NEGATIVE
Ketones, UA: NEGATIVE
Leukocytes,UA: NEGATIVE
Nitrite, UA: NEGATIVE
Protein,UA: NEGATIVE
RBC, UA: NEGATIVE
Specific Gravity, UA: 1.02 (ref 1.005–1.030)
Urobilinogen, Ur: 0.2 mg/dL (ref 0.2–1.0)
pH, UA: 7 (ref 5.0–7.5)

## 2020-02-26 LAB — MICROSCOPIC EXAMINATION: Bacteria, UA: NONE SEEN

## 2020-04-04 NOTE — Progress Notes (Signed)
04/05/2020 11:16 AM   Abigail Malone Nov 14, 1950 [id number]  Referring provider: Mickey Farber, MD [address] [facility] [place],  [place] [address]  Chief Complaint  Patient presents with  . Urinary Incontinence    HPI: Abigail Malone is 69 y.o. female management of mixed urinary incontinence.  She was seen and evaluated by Dr. Apolinar Junes on 02/25/2020 and was started on oxybutynin XL 15 mg daily.  See note for further details.    She states the oxybutynin XL 15 mg daily is working.  She states that the urge incontinence has abated.  She is not experiencing dry eyes, dry mouth, constipation or memory issues.  Patient denies any modifying or aggravating factors.  Patient denies any gross hematuria, dysuria or suprapubic/flank pain.  Patient denies any fevers, chills, nausea or vomiting.   PVR 56 cc  She also states that she is having a difficult time losing weight.  She states that she drinks a lot of water and is watching what she eats.  She states that she walks about a mile 3 days a week.  She had read about a supplement called "Oxytrim" and is wanting to try that for weight loss.  She does not engage in strength training.  PMH: Past Medical History:  Diagnosis Date  . Abnormal uterine bleeding (AUB) 05/15/2016  . Allergic rhinitis, seasonal 12/09/2014  . Anemia, unspecified 11/18/2013  . HTN (hypertension) 11/18/2013  . Hypertension   . Menopausal symptoms 08/05/2014  . Right knee pain 07/26/2015    Surgical History: Past Surgical History:  Procedure Laterality Date  . COLONOSCOPY  ~2018    Home Medications:  Allergies as of 04/05/2020      Reactions   Amlodipine    Other reaction(s): Other (See Comments) Leg cramping      Medication List       Accurate as of April 05, 2020 11:16 AM. If you have any questions, ask your nurse or doctor.        amLODipine 5 MG tablet Commonly known as: NORVASC Take 5 mg by mouth daily. Pt has not been  taken from past 2 week giving tingling and cramping so stop taking it.   conjugated estrogens vaginal cream Commonly known as: Premarin Apply 0.5mg  (pea-sized amount)  just inside the vaginal introitus with a finger-tip on  Monday, Wednesday and Friday nights.   lisinopril-hydrochlorothiazide 20-12.5 MG tablet Commonly known as: ZESTORETIC Take by mouth.   oxybutynin 15 MG 24 hr tablet Commonly known as: DITROPAN XL Take 1 tablet (15 mg total) by mouth daily. What changed: Another medication with the same name was removed. Continue taking this medication, and follow the directions you see here. Changed by: Michiel Cowboy, PA-C       Allergies:  Allergies  Allergen Reactions  . Amlodipine     Other reaction(s): Other (See Comments) Leg cramping    Family History: Family History  Problem Relation Age of Onset  . Hematuria Mother   . Liver cancer Mother   . Prostate cancer Father   . Kidney failure Brother   . Breast cancer Neg Hx     Social History:  reports that she quit smoking about 13 years ago. She has a 4.00 pack-year smoking history. She has never used smokeless tobacco. She reports current alcohol use. She reports that she does not use drugs.  ROS For pertinent review of systems please refer to history of present illness  Physical Exam: BP 124/73  Pulse 73   Ht 4\' 11"  (1.499 m)   Wt 171 lb 11.2 oz (77.9 kg)   BMI 34.68 kg/m   Constitutional:  Well nourished. Alert and oriented, No acute distress. HEENT: [place] AT, mask in place.  Trachea midline Cardiovascular: No clubbing, cyanosis, or edema. Respiratory: Normal respiratory effort, no increased work of breathing. Neurologic: Grossly intact, no focal deficits, moving all 4 extremities. Psychiatric: Normal mood and affect.   Laboratory Data: No recent labs  Pertinent Imaging Results for [name], [name] as of 04/05/2020 11:13  Ref. Range 04/05/2020 10:50  Scan Result Unknown 56     Assessment & Plan:    1. Mixed incontinence Continue oxybutynin 15 mg XL daily - Bladder Scan (Post Void Residual) in office RTC in one year for PVR and symptom recheck  2. Unhealthy weight I googled the supplement "Oxytrim" and advised her that it contained what look like several different amino acids and some herbal ingredients that I was not familiar with.  I advised her that the supplement has a cost of $50 a month and suggested that it would be better to work with a 04/07/2020 versus spending that amount of money monthly.  [place] and with the price of gas she did not want to make daily trips to the gym.  Systems analyst, PA-C  [facility] [address] [place], [place] [phone]   I spent 27 minutes on the day of the encounter to include pre-visit record review, face-to-face time with the patient, and post-visit ordering of tests.

## 2020-04-05 ENCOUNTER — Other Ambulatory Visit: Payer: Self-pay

## 2020-04-05 ENCOUNTER — Ambulatory Visit (INDEPENDENT_AMBULATORY_CARE_PROVIDER_SITE_OTHER): Payer: PPO | Admitting: Urology

## 2020-04-05 ENCOUNTER — Encounter: Payer: Self-pay | Admitting: Urology

## 2020-04-05 VITALS — BP 124/73 | HR 73 | Ht 59.0 in | Wt 171.7 lb

## 2020-04-05 DIAGNOSIS — N3946 Mixed incontinence: Secondary | ICD-10-CM | POA: Diagnosis not present

## 2020-04-05 DIAGNOSIS — Z6834 Body mass index (BMI) 34.0-34.9, adult: Secondary | ICD-10-CM | POA: Diagnosis not present

## 2020-04-05 LAB — BLADDER SCAN AMB NON-IMAGING: Scan Result: 56

## 2020-04-27 ENCOUNTER — Other Ambulatory Visit: Payer: PPO

## 2020-04-27 DIAGNOSIS — Z20822 Contact with and (suspected) exposure to covid-19: Secondary | ICD-10-CM | POA: Diagnosis not present

## 2020-04-28 LAB — NOVEL CORONAVIRUS, NAA: SARS-CoV-2, NAA: NOT DETECTED

## 2020-04-28 LAB — SARS-COV-2, NAA 2 DAY TAT

## 2020-05-14 ENCOUNTER — Other Ambulatory Visit: Payer: Self-pay | Admitting: Urology

## 2020-05-18 NOTE — Telephone Encounter (Signed)
Her chart says that she is on oxybutynin XL 15 mg daily, but the request is for oxybutynin XL 10 mg daily.  Would you call the patient and confirm what dose she is on?

## 2020-05-31 ENCOUNTER — Other Ambulatory Visit: Payer: Self-pay | Admitting: Urology

## 2020-07-14 ENCOUNTER — Other Ambulatory Visit: Payer: Self-pay | Admitting: Internal Medicine

## 2020-07-14 DIAGNOSIS — Z1231 Encounter for screening mammogram for malignant neoplasm of breast: Secondary | ICD-10-CM

## 2020-08-11 ENCOUNTER — Ambulatory Visit
Admission: RE | Admit: 2020-08-11 | Discharge: 2020-08-11 | Disposition: A | Discharge status: Home / Self Care | Payer: PPO | Source: Ambulatory Visit | Attending: Internal Medicine | Admitting: Internal Medicine

## 2020-08-11 ENCOUNTER — Other Ambulatory Visit: Payer: Self-pay

## 2020-08-11 DIAGNOSIS — Z1231 Encounter for screening mammogram for malignant neoplasm of breast: Secondary | ICD-10-CM | POA: Diagnosis not present

## 2020-08-11 LAB — HM MAMMOGRAPHY

## 2020-09-28 NOTE — Progress Notes (Signed)
09/29/2020 6:55 PM   Abigail Malone [birth year] [id number]  Referring provider: Mickey Farber, MD [address] [facility] [place],  [place] [address]  Chief Complaint  Patient presents with  . Urinary Incontinence   Urological history: 1. Mixed urinary incontinence - PVR 34 mL - risk factors of obesity, vaginal delivery, a family history of incontinence, age, caffeine and vaginal atrophy   - managed with oxybutynin XL 10 mg daily - completed PT  2. Vaginal atrophy - managed with vaginal estrogen cream applied three nights weekly   HPI: Abigail Malone is 70 y.o. female who presents today for a yearly follow up.   She has been experiencing 1-7 daytime urinations, 1-2 nighttime urinations, she has mild urge incontinence, she is having episodes of urinary incontinence with laughing, coughing, etc. and some episodes of urge incontinence.  She does wear an occasional pad for urinary leakage.  She is limiting fluid intake to avoid frequent urinations and does engage in toilet mapping.  Patient denies any modifying or aggravating factors.  Patient denies any gross hematuria, dysuria or suprapubic/flank pain.  Patient denies any fevers, chills, nausea or vomiting.   UA negative.    She continues the vaginal estrogen cream and the Ditropan XL 10 mg daily  PMH: Past Medical History:  Diagnosis Date  . Abnormal uterine bleeding (AUB) 05/15/2016  . Allergic rhinitis, seasonal 12/09/2014  . Anemia, unspecified 11/18/2013  . HTN (hypertension) 11/18/2013  . Hypertension   . Menopausal symptoms 08/05/2014  . Right knee pain 07/26/2015    Surgical History: Past Surgical History:  Procedure Laterality Date  . COLONOSCOPY  ~2018    Home Medications:  Allergies as of 09/29/2020      Reactions   Amlodipine    Other reaction(s): Other (See Comments) Leg cramping      Medication List       Accurate as of September 29, 2020 11:59 PM. If you have any questions, ask  your nurse or doctor.        amLODipine 5 MG tablet Commonly known as: NORVASC Take 5 mg by mouth daily. Pt has not been taken from past 2 week giving tingling and cramping so stop taking it.   conjugated estrogens vaginal cream Commonly known as: Premarin Apply 0.5mg  (pea-sized amount)  just inside the vaginal introitus with a finger-tip on  Monday, Wednesday and Friday nights.   lisinopril-hydrochlorothiazide 20-12.5 MG tablet Commonly known as: ZESTORETIC Take by mouth.   mirabegron ER 25 MG Tb24 tablet Commonly known as: Myrbetriq Take 1 tablet (25 mg total) by mouth daily. Started by: Michiel Cowboy, PA-C   oxybutynin 10 MG 24 hr tablet Commonly known as: DITROPAN-XL TAKE 1 TABLET(15 MG) BY MOUTH DAILY       Allergies:  Allergies  Allergen Reactions  . Amlodipine     Other reaction(s): Other (See Comments) Leg cramping    Family History: Family History  Problem Relation Age of Onset  . Hematuria Mother   . Liver cancer Mother   . Prostate cancer Father   . Kidney failure Brother   . Breast cancer Neg Hx     Social History:  reports that she quit smoking about 13 years ago. She has a 4.00 pack-year smoking history. She has never used smokeless tobacco. She reports current alcohol use. She reports that she does not use drugs.  ROS For pertinent review of systems please refer to history of present illness  Physical Exam: BP 140/82  Pulse 78   Ht 4\' 11"  (1.499 m)   Wt 171 lb (77.6 kg)   BMI 34.54 kg/m   Constitutional:  Well nourished. Alert and oriented, No acute distress. HEENT: [place] AT, mask in place.  Trachea midline Cardiovascular: No clubbing, cyanosis, or edema. Respiratory: Normal respiratory effort, no increased work of breathing. GU: No CVA tenderness.  No bladder fullness or masses.  Normal external genitalia, normal pubic hair distribution, no lesions.  Normal urethral meatus, no lesions, no prolapse, no discharge.   No urethral masses,  tenderness and/or tenderness. No bladder fullness, tenderness or masses. Atrophic vagina mucosa, fair estrogen effect, no discharge, no lesions, fair pelvic support, grade II cystocele and grade I rectocele noted.  Anus and perineum are without rashes or lesions.   Vitiligo present in the labia.   Neurologic: Grossly intact, no focal deficits, moving all 4 extremities. Psychiatric: Normal mood and affect.   Laboratory Data: Urinalysis Component     Latest Ref Rng & Units 09/29/2020  Specific Gravity, UA     1.005 - 1.030 1.020  pH, UA     5.0 - 7.5 7.0  Color, UA     Yellow Yellow  Appearance Ur     Clear Clear  Leukocytes,UA     Negative Negative  Protein,UA     Negative/Trace Negative  Glucose, UA     Negative Negative  Ketones, UA     Negative Negative  RBC, UA     Negative Negative  Bilirubin, UA     Negative Negative  Urobilinogen, Ur     0.2 - 1.0 mg/dL 0.2  Nitrite, UA     Negative Negative  Microscopic Examination      See below:   Component     Latest Ref Rng & Units 09/29/2020  WBC, UA     0 - 5 /hpf 0-5  RBC     0 - 2 /hpf 0-2  Epithelial Cells (non renal)     0 - 10 /hpf 0-10  Renal Epithel, UA     None seen /hpf 0-10 (A)  Bacteria, UA     None seen/Few None seen  I have reviewed the labs.  Pertinent Imaging Results for Abigail, [name] (MRN Abigail Malone) as of 10/14/2020 11:48  Ref. Range 09/29/2020 11:32  Scan Result Unknown 34     Assessment & Plan:    1. Mixed incontinence - Bladder Scan (Post Void Residual) in office -Patient experiencing side effects with oxybutynin, so we will have a another trial with the Myrbetriq 25 mg daily to see if it offers her either the same or better control her urinary symptoms without intolerable side effects  2. Vaginal atrophy -Continue the vaginal estrogen cream 3 nights weekly  3. Cystocele -Patient would like to be referred back to physical therapy as she found it very helpful with her incontinence  issues in the past  4. Rectocele -see #3   11/29/2020, [facility]  [facility] [address], [address] [place], [place] [phone]

## 2020-09-29 ENCOUNTER — Ambulatory Visit: Payer: PPO | Admitting: Urology

## 2020-09-29 ENCOUNTER — Encounter: Payer: Self-pay | Admitting: Urology

## 2020-09-29 ENCOUNTER — Other Ambulatory Visit: Payer: Self-pay

## 2020-09-29 VITALS — BP 140/82 | HR 78 | Ht 59.0 in | Wt 171.0 lb

## 2020-09-29 DIAGNOSIS — N3946 Mixed incontinence: Secondary | ICD-10-CM

## 2020-09-29 DIAGNOSIS — N952 Postmenopausal atrophic vaginitis: Secondary | ICD-10-CM | POA: Diagnosis not present

## 2020-09-29 DIAGNOSIS — N816 Rectocele: Secondary | ICD-10-CM | POA: Diagnosis not present

## 2020-09-29 DIAGNOSIS — N8111 Cystocele, midline: Secondary | ICD-10-CM | POA: Diagnosis not present

## 2020-09-29 LAB — URINALYSIS, COMPLETE
Bilirubin, UA: NEGATIVE
Glucose, UA: NEGATIVE
Ketones, UA: NEGATIVE
Leukocytes,UA: NEGATIVE
Nitrite, UA: NEGATIVE
Protein,UA: NEGATIVE
RBC, UA: NEGATIVE
Specific Gravity, UA: 1.02 (ref 1.005–1.030)
Urobilinogen, Ur: 0.2 mg/dL (ref 0.2–1.0)
pH, UA: 7 (ref 5.0–7.5)

## 2020-09-29 LAB — MICROSCOPIC EXAMINATION: Bacteria, UA: NONE SEEN

## 2020-09-29 LAB — BLADDER SCAN AMB NON-IMAGING: Scan Result: 34

## 2020-09-29 MED ORDER — MIRABEGRON ER 25 MG PO TB24: 25.0000 mg | ORAL_TABLET | Freq: Every day | ORAL | 0 refills | Status: DC

## 2020-10-19 NOTE — Progress Notes (Signed)
10/20/2020 12:05 PM   Abigail Malone Aug 18, 1950 [id number]  Referring provider: Mickey Farber, MD [address] [facility] [place],  [place] [address]  Chief Complaint  Patient presents with  . Follow-up    3 week follow-up   Urological history: 1. Mixed urinary incontinence - PVR 34 mL - risk factors of obesity, vaginal delivery, a family history of incontinence, age, caffeine and vaginal atrophy   - managed with oxybutynin XL 10 mg daily - completed PT  2. Vaginal atrophy - managed with vaginal estrogen cream applied three nights weekly   HPI: Abigail Malone is 70 y.o. female who presents today after a trial of Myrbetriq 25 mg.    The patient is  experiencing urgency x 0-3 (improved), frequency x 4-7 (stable), not restricting fluids to avoid visits to the restroom, is engaging in toilet mapping, incontinence x 0-3 (stable) and nocturia x 0-3 (stable).   Her BP is 150/87.   Her PVR is 84 mL.    Patient denies any modifying or aggravating factors.  Patient denies any gross hematuria, dysuria or suprapubic/flank pain.  Patient denies any fevers, chills, nausea or vomiting.   The Myrbetriq was not effective in improving her urinary symptoms, but it did lesson her GI complaints.  The oxybutynin worked better controlling her urinary symptoms, but it caused more rectal pressure.    PMH: Past Medical History:  Diagnosis Date  . Abnormal uterine bleeding (AUB) 05/15/2016  . Allergic rhinitis, seasonal 12/09/2014  . Anemia, unspecified 11/18/2013  . HTN (hypertension) 11/18/2013  . Hypertension   . Menopausal symptoms 08/05/2014  . Right knee pain 07/26/2015    Surgical History: Past Surgical History:  Procedure Laterality Date  . COLONOSCOPY  ~2018    Home Medications:  Allergies as of 10/20/2020      Reactions   Amlodipine    Other reaction(s): Other (See Comments) Leg cramping      Medication List       Accurate as of October 20, 2020 12:05  PM. If you have any questions, ask your nurse or doctor.        STOP taking these medications   mirabegron ER 25 MG Tb24 tablet Commonly known as: Myrbetriq Stopped by: Abigail Batdorf, PA-C   oxybutynin 10 MG 24 hr tablet Commonly known as: DITROPAN-XL Stopped by: Abigail Sublett, PA-C     TAKE these medications   amLODipine 5 MG tablet Commonly known as: NORVASC Take 5 mg by mouth daily. Pt has not been taken from past 2 week giving tingling and cramping so stop taking it.   conjugated estrogens vaginal cream Commonly known as: Premarin Apply 0.5mg  (pea-sized amount)  just inside the vaginal introitus with a finger-tip on  Monday, Wednesday and Friday nights.   lisinopril-hydrochlorothiazide 20-12.5 MG tablet Commonly known as: ZESTORETIC Take by mouth.   Trospium Chloride 60 MG Cp24 Take 1 capsule (60 mg total) by mouth daily. Started by: Abigail Cowboy, PA-C       Allergies:  Allergies  Allergen Reactions  . Amlodipine     Other reaction(s): Other (See Comments) Leg cramping    Family History: Family History  Problem Relation Age of Onset  . Hematuria Mother   . Liver cancer Mother   . Prostate cancer Father   . Kidney failure Brother   . Breast cancer Neg Hx     Social History:  reports that she quit smoking about 13 years ago. She has a 4.00 pack-year smoking  history. She has never used smokeless tobacco. She reports current alcohol use. She reports that she does not use drugs.  ROS For pertinent review of systems please refer to history of present illness  Physical Exam: BP (!) 150/87   Pulse 76   Ht 4\' 11"  (1.499 m)   Wt 170 lb (77.1 kg)   BMI 34.34 kg/m   Constitutional:  Well nourished. Alert and oriented, No acute distress. HEENT: Abigail Malone, mask in place.  Trachea midline Cardiovascular: No clubbing, cyanosis, or edema. Respiratory: Normal respiratory effort, no increased work of breathing. Neurologic: Grossly intact, no focal deficits,  moving all 4 extremities. Psychiatric: Normal mood and affect.   Laboratory Data: No labs since last visit  Pertinent Imaging Results for Abigail, Malone (MRN Abigail Malone) as of 10/20/2020 12:01  Ref. Range 10/20/2020 11:37  Scan Result Unknown 84 ml    Assessment & Plan:    1. Mixed incontinence -Bladder Scan (Post Void Residual) in office -Not Malone goal with Mybetriq 25 mg daily -Discussed Botox, PTNS and trying a different medication.  I did explain that there are several OAB medications on the market and that if may be more luck than anything to find a medication that would control her OAB symptoms and not aggravate her hemorrhoids.   -sent a prescription in for tropsium XL 60 mg daily  2. Vaginal atrophy -Continue the vaginal estrogen cream 3 nights weekly  3. Cystocele -Patient would like to be referred back to physical therapy as she found it very helpful with her incontinence issues in the past -has not started PT as of this visit   4. Rectocele -see #3   10/22/2020, [facility]  [facility] [address] [place], [place] [phone]

## 2020-10-20 ENCOUNTER — Encounter: Payer: Self-pay | Admitting: Urology

## 2020-10-20 ENCOUNTER — Other Ambulatory Visit: Payer: Self-pay

## 2020-10-20 ENCOUNTER — Ambulatory Visit: Payer: PPO | Admitting: Urology

## 2020-10-20 VITALS — BP 150/87 | HR 76 | Ht 59.0 in | Wt 170.0 lb

## 2020-10-20 DIAGNOSIS — N816 Rectocele: Secondary | ICD-10-CM

## 2020-10-20 DIAGNOSIS — N8111 Cystocele, midline: Secondary | ICD-10-CM

## 2020-10-20 DIAGNOSIS — N3946 Mixed incontinence: Secondary | ICD-10-CM | POA: Diagnosis not present

## 2020-10-20 DIAGNOSIS — N952 Postmenopausal atrophic vaginitis: Secondary | ICD-10-CM | POA: Diagnosis not present

## 2020-10-20 LAB — BLADDER SCAN AMB NON-IMAGING: Scan Result: 84

## 2020-10-20 MED ORDER — TROSPIUM CHLORIDE ER 60 MG PO CP24: 60.0000 mg | ORAL_CAPSULE | Freq: Every day | ORAL | 0 refills | Status: DC

## 2020-10-31 ENCOUNTER — Telehealth: Payer: Self-pay

## 2020-10-31 DIAGNOSIS — N952 Postmenopausal atrophic vaginitis: Secondary | ICD-10-CM

## 2020-10-31 MED ORDER — PREMARIN 0.625 MG/GM VA CREA: TOPICAL_CREAM | VAGINAL | 12 refills | Status: DC

## 2020-10-31 NOTE — Telephone Encounter (Signed)
Incoming call from pt on triage line who states that her premarin cream rx is out of date, she requests new rx be sent in to the [facility] in [place]. RX sent.

## 2020-11-30 NOTE — Progress Notes (Signed)
12/01/2020 11:02 AM   Abigail Malone [birth year] [id number]  Referring provider: Mickey Farber, MD [address] [facility] [place],  [place] [address]  Chief Complaint  Patient presents with  . Urinary Incontinence   Urological history: 1. Mixed urinary incontinence - PVR 34 mL - risk factors of obesity, vaginal delivery, a family history of incontinence, age, caffeine and vaginal atrophy   - managed with oxybutynin XL 10 mg daily - completed PT  2. Vaginal atrophy - managed with vaginal estrogen cream applied three nights weekly   HPI: Abigail Malone is 70 y.o. female who presents today after a trial of Myrbetriq 25 mg.    She is experiencing 1-7 daytime urinations, 1-2 episodes of nocturia, she has a mild urge to urinate, she does lose urine when she laughs coughs bends etc. and this occurs 1-2 times weekly.  She states she sometimes wears absorbent pads to prevent urinary leakage.  She uses 1 absorbent pad daily.  She is not limiting her fluid intake in an effort to decrease urination.  And she sometimes engages in toilet mapping.  She feels the tropsium is still not getting her at goal.  She is still having urge incontinence especially when she has been sitting for awhile.  She used the example of a car ride.  His PVR 107 mL.    Patient denies any modifying or aggravating factors.  Patient denies any gross hematuria, dysuria or suprapubic/flank pain.  Patient denies any fevers, chills, nausea or vomiting.   PMH: Past Medical History:  Diagnosis Date  . Abnormal uterine bleeding (AUB) 05/15/2016  . Allergic rhinitis, seasonal 12/09/2014  . Anemia, unspecified 11/18/2013  . HTN (hypertension) 11/18/2013  . Hypertension   . Menopausal symptoms 08/05/2014  . Right knee pain 07/26/2015    Surgical History: Past Surgical History:  Procedure Laterality Date  . COLONOSCOPY  ~2018    Home Medications:  Allergies as of 12/01/2020      Reactions    Amlodipine    Other reaction(s): Other (See Comments) Leg cramping      Medication List       Accurate as of Dec 01, 2020 11:02 AM. If you have any questions, ask your nurse or doctor.        amLODipine 5 MG tablet Commonly known as: NORVASC Take 5 mg by mouth daily. Pt has not been taken from past 2 week giving tingling and cramping so stop taking it.   lisinopril-hydrochlorothiazide 20-12.5 MG tablet Commonly known as: ZESTORETIC Take by mouth.   Premarin vaginal cream Generic drug: conjugated estrogens Estrogen Cream Instruction Discard applicator Apply pea sized amount to tip of finger to urethra before bed. Wash hands well after application. Use Monday, Wednesday and Friday   Trospium Chloride 60 MG Cp24 Take 1 capsule (60 mg total) by mouth daily.       Allergies:  Allergies  Allergen Reactions  . Amlodipine     Other reaction(s): Other (See Comments) Leg cramping    Family History: Family History  Problem Relation Age of Onset  . Hematuria Mother   . Liver cancer Mother   . Prostate cancer Father   . Kidney failure Brother   . Breast cancer Neg Hx     Social History:  reports that she quit smoking about 13 years ago. She has a 4.00 pack-year smoking history. She has never used smokeless tobacco. She reports current alcohol use. She reports that she does not use drugs.  ROS For pertinent review of systems please refer to history of present illness  Physical Exam: BP 113/73   Pulse 80   Ht 4\' 11"  (1.499 m)   Wt 170 lb (77.1 kg)   BMI 34.34 kg/m   Constitutional:  Well nourished. Alert and oriented, No acute distress. HEENT: [place] AT, mask in place.  Trachea midline Cardiovascular: No clubbing, cyanosis, or edema. Respiratory: Normal respiratory effort, no increased work of breathing. Neurologic: Grossly intact, no focal deficits, moving all 4 extremities. Psychiatric: Normal mood and affect.   Laboratory Data: No labs since last visit  Pertinent  Imaging Results for MAYMUNAH, STEGEMANN (MRN Abigail Malone) as of 12/01/2020 11:19  Ref. Range 12/01/2020 11:01  Scan Result Unknown 107     Assessment & Plan:    1. Mixed incontinence -Bladder Scan (Post Void Residual) in office -Not at goal with Mybetriq 25 mg daily or tropsium -Discussed Botox, PTNS and trying a different medication -she would like to be considered for PTNS -explained the PTNS provides treatment by indirectly providing electrical stimulation to the nerves responsible for bladder and pelvic floor function - a needle electrode generates an adjustable electrical pulse that travels to the sacral plexus via the tibial nerve which is located in the ankle, among other functions, the sacral nerve plexus regulates bladder and pelvic floor function - treatment protocol requires once-a-week treatments for 12 weeks, 30 minutes per session and many patients begin to see improvements by the 6th treatment. Patients who respond to treatment may require occasional treatments (~ once every 3 weeks) to sustain improvements. PTNS is a low-risk procedure. The most common side-effects with PTNS treatment are temporary and minor, resulting from the placement of the needle electrode. They include minor bleeding, mild pain and skin inflammation and patients have seen up to an 80% success rate with this form of treatment  2. Vaginal atrophy -Continue the vaginal estrogen cream 3 nights weekly  3. Cystocele -insurance will not cover PTNS   4. Rectocele -see #3   01/31/2021, [facility]  [facility] [address] [place], [place] [phone]

## 2020-12-01 ENCOUNTER — Encounter: Payer: Self-pay | Admitting: Urology

## 2020-12-01 ENCOUNTER — Ambulatory Visit: Payer: PPO | Admitting: Urology

## 2020-12-01 ENCOUNTER — Other Ambulatory Visit: Payer: Self-pay

## 2020-12-01 ENCOUNTER — Telehealth: Payer: Self-pay | Admitting: Urology

## 2020-12-01 VITALS — BP 113/73 | HR 80 | Ht 59.0 in | Wt 170.0 lb

## 2020-12-01 DIAGNOSIS — N952 Postmenopausal atrophic vaginitis: Secondary | ICD-10-CM | POA: Diagnosis not present

## 2020-12-01 DIAGNOSIS — N8111 Cystocele, midline: Secondary | ICD-10-CM | POA: Diagnosis not present

## 2020-12-01 DIAGNOSIS — N3946 Mixed incontinence: Secondary | ICD-10-CM | POA: Diagnosis not present

## 2020-12-01 LAB — BLADDER SCAN AMB NON-IMAGING: Scan Result: 107

## 2020-12-01 NOTE — Telephone Encounter (Signed)
Mrs. Abigail Malone would like to be consider for PTNS.  Would you check with her insurance?

## 2020-12-05 NOTE — Telephone Encounter (Signed)
No PA required. Will contact patient to set up appts.

## 2020-12-15 NOTE — Telephone Encounter (Signed)
PTNS appts made, patient aware and confirmed all appts.

## 2020-12-26 ENCOUNTER — Ambulatory Visit: Payer: PPO | Admitting: Urology

## 2020-12-26 ENCOUNTER — Other Ambulatory Visit: Payer: Self-pay

## 2020-12-26 ENCOUNTER — Encounter: Payer: Self-pay | Admitting: Urology

## 2020-12-26 DIAGNOSIS — N3946 Mixed incontinence: Secondary | ICD-10-CM

## 2020-12-26 NOTE — Progress Notes (Signed)
Patient ID: Abigail Malone, female   DOB: [birth year], 70 y.o.   MRN: [id number] PTNS  Session # 1  Health & Social Factors: no change Caffeine: 0 Alcohol: 0 Daytime voids #per day: 5 Night-time voids #per night: 1 Urgency: mild Incontinence Episodes #per day: 0 Ankle used: right Treatment Setting: 11 Feeling/ Response: sensory Comments: pt tolerated well  Performed By: Mervin Hack, CMA  Assistant:   Follow Up: 1 week for #2

## 2021-01-02 ENCOUNTER — Ambulatory Visit: Payer: PPO | Admitting: Urology

## 2021-01-02 ENCOUNTER — Other Ambulatory Visit: Payer: Self-pay

## 2021-01-02 DIAGNOSIS — N3946 Mixed incontinence: Secondary | ICD-10-CM

## 2021-01-02 NOTE — Progress Notes (Signed)
Patient ID: Abigail Malone, female   DOB: [birth year], 70 y.o.   MRN: [id number] PTNS  Session # 2  Health & Social Factors: no change Caffeine: 0 Alcohol: 0 Daytime voids #per day: 4-5 Night-time voids #per night: 1 Urgency: mild Incontinence Episodes #per day: 0 Ankle used: right Treatment Setting: 9 Feeling/ Response: sensory Comments: pt tolerated well  Performed By: Mervin Hack, CMA  Assistant:   Follow Up: 1 week for #3

## 2021-01-09 ENCOUNTER — Ambulatory Visit: Payer: PPO | Admitting: Urology

## 2021-01-09 ENCOUNTER — Other Ambulatory Visit: Payer: Self-pay

## 2021-01-09 DIAGNOSIS — N3946 Mixed incontinence: Secondary | ICD-10-CM | POA: Diagnosis not present

## 2021-01-09 NOTE — Progress Notes (Signed)
Patient ID: Abigail Malone, female   DOB: 1950/11/17, 70 y.o.   MRN: [id number] PTNS  Session # 3  Health & Social Factors: no change Caffeine: 0 Alcohol: 0 Daytime voids #per day: 3 Night-time voids #per night: 1 Urgency: mild Incontinence Episodes #per day: 1 Ankle used: right Treatment Setting: 2 Feeling/ Response: sensory Comments: pt tolerated well  Performed By: Mervin Hack, CMA  Assistant:   Follow Up: 1 week for #4

## 2021-01-16 ENCOUNTER — Other Ambulatory Visit: Payer: Self-pay

## 2021-01-16 ENCOUNTER — Ambulatory Visit: Payer: PPO | Admitting: Urology

## 2021-01-16 DIAGNOSIS — N3946 Mixed incontinence: Secondary | ICD-10-CM

## 2021-01-16 NOTE — Progress Notes (Signed)
Patient ID: Abigail Malone, female   DOB: [birth year], 70 y.o.   MRN: [id number] PTNS  Session # 4  Health & Social Factors: no change Caffeine: 0 Alcohol: 0 Daytime voids #per day: 6 Night-time voids #per night: 1 Urgency: mild Incontinence Episodes #per day: 0 Ankle used: right Treatment Setting: 4 Feeling/ Response: both Comments: pt tolerated well  Performed By: Mervin Hack, CMA  Assistant:   Follow Up: 1 week for #5

## 2021-01-30 ENCOUNTER — Ambulatory Visit: Payer: PPO | Admitting: Urology

## 2021-01-30 ENCOUNTER — Other Ambulatory Visit: Payer: Self-pay

## 2021-01-30 DIAGNOSIS — N3946 Mixed incontinence: Secondary | ICD-10-CM

## 2021-01-30 NOTE — Progress Notes (Signed)
Patient ID: Abigail Malone, female   DOB: Feb 07, 1951, 70 y.o.   MRN: [id number] PTNS  Session # 5  Health & Social Factors: no change Caffeine: 0 Alcohol: 0 Daytime voids #per day: 6 Night-time voids #per night: 1 Urgency: mild Incontinence Episodes #per day: 1 Ankle used: right Treatment Setting: 3 Feeling/ Response: both Comments: pt tolerated well  Performed By: Mervin Hack, CMA   Follow Up: #6 in 1 week

## 2021-02-06 ENCOUNTER — Ambulatory Visit (INDEPENDENT_AMBULATORY_CARE_PROVIDER_SITE_OTHER): Payer: PPO | Admitting: *Deleted

## 2021-02-06 ENCOUNTER — Other Ambulatory Visit: Payer: Self-pay

## 2021-02-06 DIAGNOSIS — N3946 Mixed incontinence: Secondary | ICD-10-CM | POA: Diagnosis not present

## 2021-02-06 NOTE — Progress Notes (Signed)
PTNS   Session # 6   Health & Social Factors: no change Caffeine: 0 Alcohol: 0 Daytime voids #per day: 6 Night-time voids #per night: 1 Urgency: mild Incontinence Episodes #per day: 1 Ankle used: right Treatment Setting: 3 Feeling/ Response: both Comments: pt tolerated well   Performed By: Ples Specter  CMA     Follow Up: #7 in 1 week

## 2021-02-13 ENCOUNTER — Ambulatory Visit: Payer: PPO | Admitting: Urology

## 2021-02-13 ENCOUNTER — Other Ambulatory Visit: Payer: Self-pay

## 2021-02-13 ENCOUNTER — Encounter: Payer: Self-pay | Admitting: Urology

## 2021-02-13 DIAGNOSIS — N3946 Mixed incontinence: Secondary | ICD-10-CM

## 2021-02-13 NOTE — Progress Notes (Signed)
PTNS  Session # 7/12  Health & Social Factors: No change Caffeine: 0 Alcohol: 0 Daytime voids #per day: 4 Night-time voids #per night: 2 Urgency: mild Incontinence Episodes #per day: 0 Ankle used: Right Treatment Setting: 5 Feeling/ Response: Sensory Comments: Patient tolerated the procedure   Performed By: Michiel Cowboy, PA-C  Assistant: Mervin Hack, CMA  Follow Up: One week for #8 PTNS

## 2021-02-20 ENCOUNTER — Other Ambulatory Visit: Payer: Self-pay

## 2021-02-20 ENCOUNTER — Ambulatory Visit: Payer: PPO | Admitting: Urology

## 2021-02-20 ENCOUNTER — Encounter: Payer: Self-pay | Admitting: Urology

## 2021-02-20 DIAGNOSIS — N3946 Mixed incontinence: Secondary | ICD-10-CM

## 2021-02-20 NOTE — Progress Notes (Signed)
Patient ID: Abigail Malone, female   DOB: 1951/04/02, 70 y.o.   MRN: [id number] PTNS  Session # 8  Health & Social Factors: no change Caffeine: 0 Alcohol: 0 Daytime voids #per day: 4 Night-time voids #per night: 2 Urgency: mild Incontinence Episodes #per day: 0 Ankle used: right Treatment Setting: 4 Feeling/ Response: both Comments: pt tolerated well  Performed By: Mervin Hack, CMA   Follow Up: 1 week #9

## 2021-02-27 ENCOUNTER — Ambulatory Visit: Payer: Self-pay

## 2021-03-06 ENCOUNTER — Ambulatory Visit (INDEPENDENT_AMBULATORY_CARE_PROVIDER_SITE_OTHER): Payer: PPO | Admitting: *Deleted

## 2021-03-06 ENCOUNTER — Other Ambulatory Visit: Payer: Self-pay

## 2021-03-06 DIAGNOSIS — N3946 Mixed incontinence: Secondary | ICD-10-CM | POA: Diagnosis not present

## 2021-03-06 NOTE — Progress Notes (Signed)
Patient ID: Abigail Malone, female   DOB: Mar 18, 1951, 70 y.o.   MRN: [id number] PTNS  Session # 9  Health & Social Factors: no change Caffeine: 0 Alcohol: 0 Daytime voids #per day: 4 Night-time voids #per night: 2 Urgency: mild Incontinence Episodes #per day: 0 Ankle used: right Treatment Setting: 8 Feeling/ Response: both Comments: pt tolerated well  Performed By: Mervin Hack, CMA   Follow Up: 1 week #10

## 2021-03-13 ENCOUNTER — Ambulatory Visit: Payer: PPO | Admitting: Urology

## 2021-03-20 ENCOUNTER — Ambulatory Visit: Payer: PPO | Admitting: Urology

## 2021-03-20 ENCOUNTER — Other Ambulatory Visit: Payer: Self-pay

## 2021-03-20 DIAGNOSIS — N3946 Mixed incontinence: Secondary | ICD-10-CM | POA: Diagnosis not present

## 2021-03-20 NOTE — Progress Notes (Signed)
Patient ID: Abigail Malone, female   DOB: 1950/08/23, 70 y.o.   MRN: [id number] PTNS  Session # 10  Health & Social Factors: no change Caffeine: 0 Alcohol: 0 Daytime voids #per day: 4 Night-time voids #per night: 2 Urgency: mild Incontinence Episodes #per day: 0 Ankle used: right Treatment Setting: 10 Feeling/ Response: sensory Comments: pt tolerated well  Performed By: Mervin Hack, CMA   Follow Up: 1 week for #11

## 2021-03-28 ENCOUNTER — Other Ambulatory Visit: Payer: Self-pay

## 2021-03-28 ENCOUNTER — Ambulatory Visit (INDEPENDENT_AMBULATORY_CARE_PROVIDER_SITE_OTHER): Payer: PPO | Admitting: *Deleted

## 2021-03-28 DIAGNOSIS — N3946 Mixed incontinence: Secondary | ICD-10-CM | POA: Diagnosis not present

## 2021-03-28 NOTE — Patient Instructions (Signed)
Tracking Your Bladder Symptoms    Patient Name:___________________________________________________   Sample: Day   Daytime Voids  Nighttime Voids Urgency for the Day(0-4) Number of Accidents Beverage Comments  Monday IIII II 2 I Water IIII Coffee  I      Week Starting:____________________________________   Day Daytime  Voids Nighttime  Voids Urgency for  The Day(0-4) Number of Accidents Beverages Comments                                                           This week my symptoms were:  O much better  O better O the same O worse   

## 2021-03-28 NOTE — Progress Notes (Signed)
PTNS   Session # 11   Health & Social Factors: no change Caffeine: 0 Alcohol: 0 Daytime voids #per day: 4 Night-time voids #per night: 2 Urgency: mild Incontinence Episodes #per day: 0 Ankle used: right Treatment Setting:5 Feeling/ Response: sensory Comments: pt tolerated well   Performed By: Ples Specter CMA     Follow Up: 1 week for #12

## 2021-04-03 ENCOUNTER — Other Ambulatory Visit: Payer: Self-pay

## 2021-04-03 ENCOUNTER — Ambulatory Visit: Payer: PPO | Admitting: Urology

## 2021-04-03 ENCOUNTER — Ambulatory Visit (INDEPENDENT_AMBULATORY_CARE_PROVIDER_SITE_OTHER): Payer: PPO | Admitting: Family Medicine

## 2021-04-03 ENCOUNTER — Encounter: Payer: Self-pay | Admitting: Family Medicine

## 2021-04-03 VITALS — BP 130/70 | HR 80 | Ht 59.0 in | Wt 174.0 lb

## 2021-04-03 DIAGNOSIS — Z0000 Encounter for general adult medical examination without abnormal findings: Secondary | ICD-10-CM | POA: Diagnosis not present

## 2021-04-03 DIAGNOSIS — Z23 Encounter for immunization: Secondary | ICD-10-CM

## 2021-04-03 DIAGNOSIS — I10 Essential (primary) hypertension: Secondary | ICD-10-CM | POA: Diagnosis not present

## 2021-04-03 DIAGNOSIS — E669 Obesity, unspecified: Secondary | ICD-10-CM

## 2021-04-03 DIAGNOSIS — N3946 Mixed incontinence: Secondary | ICD-10-CM | POA: Diagnosis not present

## 2021-04-03 NOTE — Progress Notes (Signed)
Patient ID: Abigail Malone, female   DOB: [birth year], 70 y.o.   MRN: [id number] PTNS  Session # 12  Health & Social Factors: no change Caffeine: 0 Alcohol: 0 Daytime voids #per day: 3-4 Night-time voids #per night: 1-2 Urgency: mild Incontinence Episodes #per day: 0 Ankle used: right Treatment Setting: 6 Feeling/ Response: sensory Comments: pt tolerated well  Performed By: Mervin Hack, CMA  Follow Up: 1 week for office visit

## 2021-04-03 NOTE — Progress Notes (Signed)
Date:  04/03/2021   Name:  Abigail Malone   DOB:  04-10-1951   MRN:  [id number]   Chief Complaint: Establish Care  Patient is a 70 year old female who presents for an establish care exam. The patient reports the following problems: hypertension. Health maintenance has been reviewed influenza.     No results found for: CREATININE, BUN, NA, K, CL, CO2 No results found for: CHOL, HDL, LDLCALC, LDLDIRECT, TRIG, CHOLHDL No results found for: TSH No results found for: HGBA1C No results found for: WBC, HGB, HCT, MCV, PLT No results found for: ALT, AST, GGT, ALKPHOS, BILITOT   Review of Systems  Constitutional:  Negative for chills and fever.  HENT:  Negative for drooling, ear discharge, ear pain and sore throat.   Respiratory:  Negative for cough, shortness of breath and wheezing.   Cardiovascular:  Negative for chest pain, palpitations and leg swelling.  Gastrointestinal:  Negative for abdominal pain, blood in stool, constipation, diarrhea and nausea.  Endocrine: Negative for polydipsia.  Genitourinary:  Negative for dysuria, frequency, hematuria and urgency.  Musculoskeletal:  Negative for back pain, myalgias and neck pain.  Skin:  Negative for rash.  Allergic/Immunologic: Negative for environmental allergies.  Neurological:  Negative for dizziness and headaches.  Hematological:  Does not bruise/bleed easily.  Psychiatric/Behavioral:  Negative for suicidal ideas. The patient is not nervous/anxious.    Patient Active Problem List   Diagnosis Date Noted   Obesity with body mass index (BMI) of 30.0 to 39.9 01/01/2020   Cardiac syncope 05/18/2019   Hyperlipidemia, mixed 05/18/2019   Mixed stress and urge urinary incontinence 04/01/2018   Abnormal uterine bleeding (AUB) 05/15/2016   Right knee pain 07/26/2015   Allergic rhinitis, seasonal 12/09/2014   Menopausal symptoms 08/05/2014   HTN (hypertension) 11/18/2013   Anemia, unspecified 11/18/2013    Allergies  Allergen  Reactions   Amlodipine     Other reaction(s): Other (See Comments) Leg cramping    Past Surgical History:  Procedure Laterality Date   COLONOSCOPY  ~2018    Social History   Tobacco Use   Smoking status: Former    Packs/day: 0.50    Years: 8.00    Pack years: 4.00    Types: Cigarettes    Quit date: 12/22/2006    Years since quitting: 14.2   Smokeless tobacco: Never  Vaping Use   Vaping Use: Never used  Substance Use Topics   Alcohol use: Not Currently   Drug use: No     Medication list has been reviewed and updated.  Current Meds  Medication Sig   conjugated estrogens (PREMARIN) vaginal cream Estrogen Cream Instruction Discard applicator Apply pea sized amount to tip of finger to urethra before bed. Wash hands well after application. Use Monday, Wednesday and Friday (Patient taking differently: Estrogen Cream Instruction Discard applicator Apply pea sized amount to tip of finger to urethra before bed. Wash hands well after application. Use Monday, Wednesday and Friday/ shannon Mcgowan)   lisinopril-hydrochlorothiazide (ZESTORETIC) 20-25 MG tablet Take 1 tablet by mouth daily.   [DISCONTINUED] amLODipine (NORVASC) 5 MG tablet Take 5 mg by mouth daily. Pt has not been taken from past 2 week giving tingling and cramping so stop taking it.   [DISCONTINUED] Trospium Chloride 60 MG CP24 Take 1 capsule (60 mg total) by mouth daily.    PHQ 2/9 Scores 04/03/2021  PHQ - 2 Score 0  PHQ- 9 Score 0    GAD 7 : Generalized Anxiety Score  04/03/2021  Nervous, Anxious, on Edge 0  Control/stop worrying 0  Worry too much - different things 0  Trouble relaxing 0  Restless 0  Easily annoyed or irritable 0  Afraid - awful might happen 0  Total GAD 7 Score 0    BP Readings from Last 3 Encounters:  04/03/21 130/70  12/01/20 113/73  10/20/20 (!) 150/87    Physical Exam Vitals and nursing note reviewed.  Constitutional:      Appearance: She is well-developed.  HENT:     Head:  Normocephalic.     Right Ear: Tympanic membrane and external ear normal.     Left Ear: Tympanic membrane and external ear normal.  Eyes:     General: Lids are everted, no foreign bodies appreciated. No scleral icterus.       Left eye: No foreign body or hordeolum.     Conjunctiva/sclera: Conjunctivae normal.     Right eye: Right conjunctiva is not injected.     Left eye: Left conjunctiva is not injected.     Pupils: Pupils are equal, round, and reactive to light.  Neck:     Thyroid: No thyromegaly.     Vascular: No JVD.     Trachea: No tracheal deviation.  Cardiovascular:     Rate and Rhythm: Normal rate and regular rhythm.     Heart sounds: Normal heart sounds, S1 normal and S2 normal. No murmur heard. No systolic murmur is present.  No diastolic murmur is present.    No friction rub. No gallop. No S3 or S4 sounds.  Pulmonary:     Effort: Pulmonary effort is normal. No respiratory distress.     Breath sounds: Normal breath sounds. No decreased breath sounds, wheezing, rhonchi or rales.  Abdominal:     General: Bowel sounds are normal.     Palpations: Abdomen is soft. There is no hepatomegaly, splenomegaly or mass.     Tenderness: There is no abdominal tenderness. There is no guarding or rebound.  Musculoskeletal:        General: No tenderness. Normal range of motion.     Cervical back: Normal range of motion and neck supple.  Lymphadenopathy:     Cervical: No cervical adenopathy.  Skin:    General: Skin is warm.     Findings: No rash.  Neurological:     Mental Status: She is alert and oriented to person, place, and time.     Cranial Nerves: No cranial nerve deficit.     Deep Tendon Reflexes: Reflexes normal.  Psychiatric:        Mood and Affect: Mood is not anxious or depressed.    Wt Readings from Last 3 Encounters:  04/03/21 174 lb (78.9 kg)  12/01/20 170 lb (77.1 kg)  10/20/20 170 lb (77.1 kg)    BP 130/70   Pulse 80   Ht 4\' 11"  (1.499 m)   Wt 174 lb (78.9 kg)    BMI 35.14 kg/m   Assessment and Plan:  1. Encounter for medical examination to establish care Patient establishing care with new physician.  There is no specific concerns.  Health maintenance was reviewed with no gaps.  Patient is electing to get her influenza immunization today.  2. Obesity with body mass index (BMI) of 30.0 to 39.9 Health risks of being over weight were discussed and patient was counseled on weight loss options and exercise.  Patient has been given a diet for weight loss and encouraged to work her way to a  1500-calorie diet.  3. Primary hypertension Chronic.  Controlled.  Stable.  Patient is currently on lisinopril-hydrochlorothiazide 20-25 mg once a day.  Patient has enough medication to sustain her.  Blood pressure today is noted to be controlled at 130/70.

## 2021-04-03 NOTE — Patient Instructions (Signed)

## 2021-04-05 ENCOUNTER — Ambulatory Visit: Payer: Self-pay | Admitting: Urology

## 2021-04-10 ENCOUNTER — Other Ambulatory Visit: Payer: Self-pay

## 2021-04-10 ENCOUNTER — Encounter: Payer: Self-pay | Admitting: Urology

## 2021-04-10 ENCOUNTER — Ambulatory Visit: Payer: PPO | Admitting: Urology

## 2021-04-10 VITALS — BP 122/60 | HR 60 | Ht 59.0 in | Wt 170.0 lb

## 2021-04-10 DIAGNOSIS — N3946 Mixed incontinence: Secondary | ICD-10-CM

## 2021-04-10 LAB — BLADDER SCAN AMB NON-IMAGING

## 2021-04-10 NOTE — Progress Notes (Signed)
04/10/2021 11:52 AM   [name] [birth year] [id number]  Referring provider: No referring provider defined for this encounter.  Urological history: 1. Mixed urinary incontinence -risk factors of obesity, vaginal delivery, a family history of incontinence, age, caffeine and vaginal atrophy   -completed PT -failed OAB agents  2. Vaginal atrophy - managed with vaginal estrogen cream applied three nights weekly   HPI: Abigail Malone is 70 y.o. female who presents today for follow up after 12 weekly treatments of PTNS.  She was experiencing 5 day time urination and 1-2 nocturia at the beginning of PTNS treatment and now 3-4 day time urination and night time urination stable.     She is experiencing 0-3 day time urgency, 4-7 daytime urinations, 0-3 episodes of nocturia, she has a mild urge to urinate, she does lose urine when she laughs coughs bends etc. and this occurs 1-2 times weekly.  She states she sometimes wears absorbent pads to prevent urinary leakage.  She uses 1 absorbent pad daily.  She is not limiting her fluid intake in an effort to decrease urination.  And she sometimes engages in toilet mapping.  Patient denies any modifying or aggravating factors.  Patient denies any gross hematuria, dysuria or suprapubic/flank pain.  Patient denies any fevers, chills, nausea or vomiting.   Her PVR is 0 mL.   PMH: Past Medical History:  Diagnosis Date   Abnormal uterine bleeding (AUB) 05/15/2016   Allergic rhinitis, seasonal 12/09/2014   Anemia, unspecified 11/18/2013   HTN (hypertension) 11/18/2013   Hypertension    Menopausal symptoms 08/05/2014   Right knee pain 07/26/2015    Surgical History: Past Surgical History:  Procedure Laterality Date   COLONOSCOPY  ~2018    Home Medications:  Allergies as of 04/10/2021       Reactions   Amlodipine    Other reaction(s): Other (See Comments) Leg cramping        Medication List        Accurate as of April 10, 2021 11:52 AM. If you have any questions, ask your nurse or doctor.          lisinopril-hydrochlorothiazide 20-25 MG tablet Commonly known as: ZESTORETIC Take 1 tablet by mouth daily.   Premarin vaginal cream Generic drug: conjugated estrogens Estrogen Cream Instruction Discard applicator Apply pea sized amount to tip of finger to urethra before bed. Wash hands well after application. Use Monday, Wednesday and Friday        Allergies:  Allergies  Allergen Reactions   Amlodipine     Other reaction(s): Other (See Comments) Leg cramping    Family History: Family History  Problem Relation Age of Onset   Cancer Mother    Stroke Mother    Hypertension Mother    Hematuria Mother    Liver cancer Mother    Cancer Father    Prostate cancer Father    Kidney failure Brother    Breast cancer Neg Hx     Social History:  reports that she quit smoking about 14 years ago. Her smoking use included cigarettes. She has a 4.00 pack-year smoking history. She has never used smokeless tobacco. She reports that she does not currently use alcohol. She reports that she does not use drugs.  ROS For pertinent review of systems please refer to history of present illness  Physical Exam: BP 122/60   Pulse 60   Ht 4\' 11"  (1.499 m)   Wt 170 lb (77.1 kg)   BMI 34.34  kg/m   Constitutional:  Well nourished. Alert and oriented, No acute distress. HEENT: [place] AT, mask in place.  Trachea midline Cardiovascular: No clubbing, cyanosis, or edema. Respiratory: Normal respiratory effort, no increased work of breathing. Neurologic: Grossly intact, no focal deficits, moving all 4 extremities. Psychiatric: Normal mood and affect.    Laboratory Data: No labs since last visit  Pertinent Imaging Results for [name], [name] (MRN [id number]) as of 04/10/2021 11:53  Ref. Range 04/10/2021 11:48  Scan Result Unknown 38ml    Assessment & Plan:    1. Mixed incontinence -she has found no improvement  with OAB medications or PTNS -we dicussed undergoing a cystoscopy to rule out any etiology that may be contributing to her OAB to see if she would be a suitable candidate for BOTOX -she is hesitant to try BOTOX, but I reminded her that we are at the end of her treatment algorithm and BOTOX would be the next step -I have given her a brochure regarding BOTOX for her review -I have explained to the patient that they will  be scheduled for a cystoscopy in our office to evaluate their bladder.  The cystoscopy consists of passing a tube with a lens up through their urethra and into their urinary bladder.   We will inject the urethra with a lidocaine gel prior to introducing the cystoscope to help with any discomfort during the procedure.   After the procedure, they might experience blood in the urine and discomfort with urination.  This will abate after the first few voids.  I have  encouraged the patient to increase water intake  during this time.  Patient denies any allergies to lidocaine.   -cysto for refractory OAB  2. Vaginal atrophy -Continue the vaginal estrogen cream 3 nights weekly  3. Cystocele -insurance will not cover PT  4. Rectocele -see #3  Return for cysto with Dr. Apolinar Junes for refractory OAB.   Michiel Cowboy, PA-C  [facility] [address] [place], [place] [address] [phone]

## 2021-04-26 ENCOUNTER — Telehealth: Payer: Self-pay | Admitting: *Deleted

## 2021-04-26 NOTE — Telephone Encounter (Signed)
Called pt to let her know we canceled her PTNS and wanted to schedule cysto. Pt declined cysto and botox. I asked her if she wanted to come in to discuss and pt declined that as well. Appt canceled

## 2021-04-27 ENCOUNTER — Ambulatory Visit: Payer: PPO | Admitting: Urology

## 2021-05-09 ENCOUNTER — Encounter: Payer: PPO | Admitting: Family Medicine

## 2021-05-16 ENCOUNTER — Ambulatory Visit (INDEPENDENT_AMBULATORY_CARE_PROVIDER_SITE_OTHER): Payer: PPO | Admitting: Family Medicine

## 2021-05-16 ENCOUNTER — Encounter: Payer: Self-pay | Admitting: Family Medicine

## 2021-05-16 ENCOUNTER — Other Ambulatory Visit: Payer: Self-pay

## 2021-05-16 VITALS — BP 120/80 | HR 80 | Ht 59.0 in | Wt 173.0 lb

## 2021-05-16 DIAGNOSIS — E782 Mixed hyperlipidemia: Secondary | ICD-10-CM

## 2021-05-16 DIAGNOSIS — D649 Anemia, unspecified: Secondary | ICD-10-CM | POA: Diagnosis not present

## 2021-05-16 DIAGNOSIS — Z1231 Encounter for screening mammogram for malignant neoplasm of breast: Secondary | ICD-10-CM | POA: Diagnosis not present

## 2021-05-16 DIAGNOSIS — Z0000 Encounter for general adult medical examination without abnormal findings: Secondary | ICD-10-CM | POA: Diagnosis not present

## 2021-05-16 DIAGNOSIS — I10 Essential (primary) hypertension: Secondary | ICD-10-CM

## 2021-05-16 NOTE — Progress Notes (Signed)
Date:  05/16/2021   Name:  Abigail Malone   DOB:  1950/12/23   MRN:  [id number]   Chief Complaint: Annual Exam (No pap)  Abigail Malone is a 70 y.o. female who presents today for her Complete Annual Exam. She feels well. She reports exercising yes. She reports she is sleeping well.     No results found for: CREATININE, BUN, NA, K, CL, CO2 No results found for: CHOL, HDL, LDLCALC, LDLDIRECT, TRIG, CHOLHDL No results found for: TSH No results found for: HGBA1C No results found for: WBC, HGB, HCT, MCV, PLT No results found for: ALT, AST, GGT, ALKPHOS, BILITOT   Review of Systems  Patient Active Problem List   Diagnosis Date Noted   Obesity with body mass index (BMI) of 30.0 to 39.9 01/01/2020   Cardiac syncope 05/18/2019   Hyperlipidemia, mixed 05/18/2019   Mixed stress and urge urinary incontinence 04/01/2018   Abnormal uterine bleeding (AUB) 05/15/2016   Right knee pain 07/26/2015   Allergic rhinitis, seasonal 12/09/2014   Menopausal symptoms 08/05/2014   HTN (hypertension) 11/18/2013   Anemia, unspecified 11/18/2013    Allergies  Allergen Reactions   Amlodipine     Other reaction(s): Other (See Comments) Leg cramping    Past Surgical History:  Procedure Laterality Date   COLONOSCOPY  ~2018    Social History   Tobacco Use   Smoking status: Former    Packs/day: 0.50    Years: 8.00    Pack years: 4.00    Types: Cigarettes    Quit date: 12/22/2006    Years since quitting: 14.4   Smokeless tobacco: Never  Vaping Use   Vaping Use: Never used  Substance Use Topics   Alcohol use: Not Currently   Drug use: No     Medication list has been reviewed and updated.  Current Meds  Medication Sig   conjugated estrogens (PREMARIN) vaginal cream Estrogen Cream Instruction Discard applicator Apply pea sized amount to tip of finger to urethra before bed. Wash hands well after application. Use Monday, Wednesday and Friday   lisinopril-hydrochlorothiazide  (ZESTORETIC) 20-25 MG tablet Take 1 tablet by mouth daily.    PHQ 2/9 Scores 04/03/2021  PHQ - 2 Score 0  PHQ- 9 Score 0    GAD 7 : Generalized Anxiety Score 04/03/2021  Nervous, Anxious, on Edge 0  Control/stop worrying 0  Worry too much - different things 0  Trouble relaxing 0  Restless 0  Easily annoyed or irritable 0  Afraid - awful might happen 0  Total GAD 7 Score 0    BP Readings from Last 3 Encounters:  05/16/21 120/80  04/10/21 122/60  04/03/21 130/70    Physical Exam Vitals and nursing note reviewed.  Constitutional:      Appearance: She is well-developed, well-groomed and overweight.  HENT:     Head: Normocephalic.     Jaw: There is normal jaw occlusion.     Right Ear: Hearing, tympanic membrane, ear canal and external ear normal.     Left Ear: Hearing, tympanic membrane, ear canal and external ear normal.     Nose: Nose normal.     Mouth/Throat:     Lips: Pink.     Mouth: Mucous membranes are moist. No oral lesions.     Dentition: No gum lesions.     Tongue: No lesions.     Palate: No lesions.  Eyes:     General: Lids are normal. Vision grossly intact. Gaze aligned  appropriately. No scleral icterus.       Left eye: No foreign body or hordeolum.     Extraocular Movements: Extraocular movements intact.     Conjunctiva/sclera: Conjunctivae normal.     Right eye: Right conjunctiva is not injected.     Left eye: Left conjunctiva is not injected.     Pupils: Pupils are equal, round, and reactive to light.     Funduscopic exam:    Right eye: Red reflex present.        Left eye: Red reflex present. Neck:     Thyroid: No thyroid mass, thyromegaly or thyroid tenderness.     Vascular: No JVD.     Trachea: No tracheal deviation.  Cardiovascular:     Rate and Rhythm: Normal rate and regular rhythm.     Chest Wall: PMI is not displaced. No thrill.     Pulses:          Carotid pulses are 2+ on the right side and 2+ on the left side.      Radial pulses are 2+ on  the right side and 2+ on the left side.       Femoral pulses are 2+ on the right side and 2+ on the left side.      Dorsalis pedis pulses are 2+ on the right side and 2+ on the left side.       Posterior tibial pulses are 2+ on the right side and 2+ on the left side.     Heart sounds: Normal heart sounds, S1 normal and S2 normal. No murmur heard. No systolic murmur is present.  No diastolic murmur is present.    No friction rub. No gallop. No S3 or S4 sounds.  Pulmonary:     Effort: Pulmonary effort is normal. No respiratory distress.     Breath sounds: Normal breath sounds. No decreased breath sounds, wheezing, rhonchi or rales.  Chest:     Chest wall: No mass.  Breasts:    Right: Normal. No swelling, bleeding, inverted nipple, mass, nipple discharge, skin change or tenderness.     Left: Normal. No swelling, bleeding, inverted nipple, mass, nipple discharge, skin change or tenderness.  Abdominal:     General: Bowel sounds are normal.     Palpations: Abdomen is soft. There is no hepatomegaly, splenomegaly or mass.     Tenderness: There is no abdominal tenderness. There is no guarding or rebound.     Hernia: No hernia is present. There is no hernia in the umbilical area, ventral area, left inguinal area or right inguinal area.  Genitourinary:    Rectum: Normal. Guaiac result negative. No mass.  Musculoskeletal:        General: No tenderness. Normal range of motion.     Cervical back: Full passive range of motion without pain, normal range of motion and neck supple.     Right lower leg: No edema.     Left lower leg: No edema.  Lymphadenopathy:     Head:     Right side of head: No submental, submandibular or tonsillar adenopathy.     Left side of head: No submental, submandibular or tonsillar adenopathy.     Cervical: No cervical adenopathy.     Right cervical: No superficial, deep or posterior cervical adenopathy.    Left cervical: No superficial, deep or posterior cervical  adenopathy.     Upper Body:     Right upper body: No supraclavicular, axillary or pectoral adenopathy.  Left upper body: No supraclavicular, axillary or pectoral adenopathy.  Skin:    General: Skin is warm.     Capillary Refill: Capillary refill takes less than 2 seconds.     Findings: No rash.  Neurological:     Mental Status: She is alert and oriented to person, place, and time.     Cranial Nerves: Cranial nerves 2-12 are intact. No cranial nerve deficit.     Sensory: Sensation is intact.     Motor: Motor function is intact.     Coordination: Romberg sign negative.     Deep Tendon Reflexes: Reflexes normal.     Reflex Scores:      Tricep reflexes are 2+ on the right side and 2+ on the left side.      Bicep reflexes are 2+ on the right side and 2+ on the left side.      Brachioradialis reflexes are 2+ on the right side and 2+ on the left side.      Patellar reflexes are 2+ on the right side and 2+ on the left side.      Achilles reflexes are 2+ on the right side and 2+ on the left side. Psychiatric:        Mood and Affect: Mood is not anxious or depressed.        Behavior: Behavior is cooperative.    Wt Readings from Last 3 Encounters:  05/16/21 173 lb (78.5 kg)  04/10/21 170 lb (77.1 kg)  04/03/21 174 lb (78.9 kg)    BP 120/80   Pulse 80   Ht 4\' 11"  (1.499 m)   Wt 173 lb (78.5 kg)   BMI 34.94 kg/m   Assessment and Plan:  Patient is a 70year old female who presents for a comprehensive physical exam. The patient reports the following problems: No active concerns. Health maintenance has been reviewed up-to-date.  Patient's chart was reviewed for previous encounters most recent lab most recent imaging in Care Everywhere. Abigail Malone is a 70 y.o. female who presents today for her Complete Annual Exam. She feels well. She reports exercising as tolerated. She reports she is sleeping well.   1. Annual physical exam Immunizations are reviewed and recommendations  provided.   Age appropriate screening tests are discussed. Counseling given for risk factor reduction interventions.  No subjective/objective concerns noted during the HPI, review of systems, on and physical exam.  2. Breast cancer screening by mammogram Discussed with patient and mammogram ordered.  Bimanual breast exam was unremarkable. - MM 3D SCREEN BREAST BILATERAL  3. Anemia, unspecified type Chronic.  Controlled.  Stable.  Patient with history of anemia we will check CBC for current status of anemia. - CBC with Differential/Platelet  4. Hyperlipidemia, mixed Chronic.  Controlled.  Stable.  Will check lipid panel for current level of control of LDL. - Lipid Panel With LDL/HDL Ratio  5. Primary hypertension Chronic.  Controlled.  Stable.  Current blood pressure is 120/80.  Look continue with checking of renal function panel for electrolytes and GFR status. - Renal Function Panel

## 2021-05-17 LAB — RENAL FUNCTION PANEL
Albumin: 4.3 g/dL (ref 3.8–4.8)
BUN/Creatinine Ratio: 17 (ref 12–28)
BUN: 11 mg/dL (ref 8–27)
CO2: 28 mmol/L (ref 20–29)
Calcium: 9.8 mg/dL (ref 8.7–10.3)
Chloride: 101 mmol/L (ref 96–106)
Creatinine, Ser: 0.66 mg/dL (ref 0.57–1.00)
Glucose: 90 mg/dL (ref 70–99)
Phosphorus: 3.2 mg/dL (ref 3.0–4.3)
Potassium: 3.8 mmol/L (ref 3.5–5.2)
Sodium: 141 mmol/L (ref 134–144)
eGFR: 94 mL/min/{1.73_m2} (ref >59)

## 2021-05-17 LAB — CBC WITH DIFFERENTIAL/PLATELET
Basophils Absolute: 0.1 10*3/uL (ref 0.0–0.2)
Basos: 1 %
EOS (ABSOLUTE): 0.3 10*3/uL (ref 0.0–0.4)
Eos: 3 %
Hematocrit: 34.5 % (ref 34.0–46.6)
Hemoglobin: 11.4 g/dL (ref 11.1–15.9)
Immature Grans (Abs): 0 10*3/uL (ref 0.0–0.1)
Immature Granulocytes: 0 %
Lymphocytes Absolute: 2.5 10*3/uL (ref 0.7–3.1)
Lymphs: 29 %
MCH: 27.8 pg (ref 26.6–33.0)
MCHC: 33 g/dL (ref 31.5–35.7)
MCV: 84 fL (ref 79–97)
Monocytes Absolute: 0.8 10*3/uL (ref 0.1–0.9)
Monocytes: 9 %
Neutrophils Absolute: 5.1 10*3/uL (ref 1.4–7.0)
Neutrophils: 58 %
Platelets: 268 10*3/uL (ref 150–450)
RBC: 4.1 x10E6/uL (ref 3.77–5.28)
RDW: 13.5 % (ref 11.7–15.4)
WBC: 8.7 10*3/uL (ref 3.4–10.8)

## 2021-05-17 LAB — LIPID PANEL WITH LDL/HDL RATIO
Cholesterol, Total: 223 mg/dL — ABNORMAL HIGH (ref 100–199)
HDL: 56 mg/dL (ref >39)
LDL Chol Calc (NIH): 142 mg/dL — ABNORMAL HIGH (ref 0–99)
LDL/HDL Ratio: 2.5 ratio (ref 0.0–3.2)
Triglycerides: 140 mg/dL (ref 0–149)
VLDL Cholesterol Cal: 25 mg/dL (ref 5–40)

## 2021-06-01 ENCOUNTER — Telehealth: Payer: Self-pay | Admitting: Family Medicine

## 2021-06-01 DIAGNOSIS — H2513 Age-related nuclear cataract, bilateral: Secondary | ICD-10-CM | POA: Diagnosis not present

## 2021-06-01 NOTE — Telephone Encounter (Signed)
Copied from CRM [id number]. Topic: Medicare AWV >> Jun 01, 2021 10:32 AM Claudette Laws R wrote: Reason for CRM:  Left message for patient to call back and schedule Medicare Annual Wellness Visit (AWV) in office.   If unable to come into the office for AWV,  please offer to do virtually or by telephone.  No hx of AWV eligible for AWVI as of 12/21/2016 per [employer]  Please schedule at anytime with [facility] Health Advisor.      40 Minutes appointment   Any questions, please call me at [phone]

## 2021-06-05 ENCOUNTER — Ambulatory Visit (INDEPENDENT_AMBULATORY_CARE_PROVIDER_SITE_OTHER): Payer: PPO | Admitting: Family Medicine

## 2021-06-05 ENCOUNTER — Encounter: Payer: Self-pay | Admitting: Family Medicine

## 2021-06-05 ENCOUNTER — Other Ambulatory Visit: Payer: Self-pay

## 2021-06-05 VITALS — BP 140/60 | HR 77 | Resp 16 | Ht 59.5 in | Wt 175.6 lb

## 2021-06-05 DIAGNOSIS — J0100 Acute maxillary sinusitis, unspecified: Secondary | ICD-10-CM

## 2021-06-05 DIAGNOSIS — R051 Acute cough: Secondary | ICD-10-CM | POA: Diagnosis not present

## 2021-06-05 MED ORDER — GUAIFENESIN-CODEINE 100-10 MG/5ML PO SYRP: 5.0000 mL | ORAL_SOLUTION | Freq: Three times a day (TID) | ORAL | 0 refills | Status: DC | PRN

## 2021-06-05 MED ORDER — AMOXICILLIN-POT CLAVULANATE 875-125 MG PO TABS: 1.0000 | ORAL_TABLET | Freq: Two times a day (BID) | ORAL | 0 refills | Status: DC

## 2021-06-05 NOTE — Progress Notes (Signed)
Date:  06/05/2021   Name:  Abigail Malone   DOB:  1950/11/04   MRN:  [id number]   Chief Complaint: Sinus Problem and Medication Problem  inusitis  Sinus Problem This is a new problem. The current episode started in the past 7 days. The problem has been gradually worsening since onset. There has been no fever. Associated symptoms include congestion, coughing, headaches, a hoarse voice, sinus pressure, sneezing, a sore throat and swollen glands. Pertinent negatives include no chills, diaphoresis, ear pain, neck pain or shortness of breath. Past treatments include oral decongestants (ibuprofen). The treatment provided mild relief.   Lab Results  Component Value Date   CREATININE 0.66 05/16/2021   BUN 11 05/16/2021   NA 141 05/16/2021   K 3.8 05/16/2021   CL 101 05/16/2021   CO2 28 05/16/2021   Lab Results  Component Value Date   CHOL 223 (H) 05/16/2021   HDL 56 05/16/2021   LDLCALC 142 (H) 05/16/2021   TRIG 140 05/16/2021   No results found for: TSH No results found for: HGBA1C Lab Results  Component Value Date   WBC 8.7 05/16/2021   HGB 11.4 05/16/2021   HCT 34.5 05/16/2021   MCV 84 05/16/2021   PLT 268 05/16/2021   No results found for: ALT, AST, GGT, ALKPHOS, BILITOT   Review of Systems  Constitutional:  Negative for chills, diaphoresis and fever.  HENT:  Positive for congestion, hoarse voice, sinus pressure, sneezing and sore throat. Negative for drooling, ear discharge, ear pain and hearing loss.   Respiratory:  Positive for cough. Negative for shortness of breath and wheezing.   Cardiovascular:  Negative for chest pain, palpitations and leg swelling.  Gastrointestinal:  Negative for abdominal pain, blood in stool, constipation, diarrhea and nausea.  Endocrine: Negative for polydipsia.  Genitourinary:  Negative for dysuria, frequency, hematuria and urgency.  Musculoskeletal:  Negative for back pain, myalgias and neck pain.  Skin:  Negative for rash.   Allergic/Immunologic: Negative for environmental allergies.  Neurological:  Positive for headaches. Negative for dizziness.  Hematological:  Does not bruise/bleed easily.  Psychiatric/Behavioral:  Negative for suicidal ideas. The patient is not nervous/anxious.    Patient Active Problem List   Diagnosis Date Noted   Obesity with body mass index (BMI) of 30.0 to 39.9 01/01/2020   Cardiac syncope 05/18/2019   Hyperlipidemia, mixed 05/18/2019   Mixed stress and urge urinary incontinence 04/01/2018   Abnormal uterine bleeding (AUB) 05/15/2016   Right knee pain 07/26/2015   Allergic rhinitis, seasonal 12/09/2014   Menopausal symptoms 08/05/2014   HTN (hypertension) 11/18/2013   Anemia, unspecified 11/18/2013    Allergies  Allergen Reactions   Amlodipine     Other reaction(s): Other (See Comments) Leg cramping    Past Surgical History:  Procedure Laterality Date   COLONOSCOPY  ~2018    Social History   Tobacco Use   Smoking status: Former    Packs/day: 0.50    Years: 8.00    Pack years: 4.00    Types: Cigarettes    Quit date: 12/22/2006    Years since quitting: 14.4   Smokeless tobacco: Never  Vaping Use   Vaping Use: Never used  Substance Use Topics   Alcohol use: Not Currently   Drug use: No     Medication list has been reviewed and updated.  Current Meds  Medication Sig   conjugated estrogens (PREMARIN) vaginal cream Estrogen Cream Instruction Discard applicator Apply pea sized amount to tip of  finger to urethra before bed. Wash hands well after application. Use Monday, Wednesday and Friday   lisinopril-hydrochlorothiazide (ZESTORETIC) 20-25 MG tablet Take 1 tablet by mouth daily.    PHQ 2/9 Scores 06/05/2021 04/03/2021  PHQ - 2 Score 0 0  PHQ- 9 Score 4 0    GAD 7 : Generalized Anxiety Score 06/05/2021 04/03/2021  Nervous, Anxious, on Edge 0 0  Control/stop worrying 0 0  Worry too much - different things 0 0  Trouble relaxing 0 0  Restless 0 0  Easily  annoyed or irritable 0 0  Afraid - awful might happen 0 0  Total GAD 7 Score 0 0  Anxiety Difficulty Not difficult at all -    BP Readings from Last 3 Encounters:  06/05/21 140/60  05/16/21 120/80  04/10/21 122/60    Physical Exam Vitals and nursing note reviewed.  Constitutional:      General: She is not in acute distress.    Appearance: She is not diaphoretic.  HENT:     Head: Normocephalic and atraumatic.     Right Ear: Tympanic membrane, ear canal and external ear normal. There is no impacted cerumen.     Left Ear: Tympanic membrane, ear canal and external ear normal. There is no impacted cerumen.     Nose: Nose normal. No congestion or rhinorrhea.  Eyes:     General:        Right eye: No discharge.        Left eye: No discharge.     Conjunctiva/sclera: Conjunctivae normal.     Pupils: Pupils are equal, round, and reactive to light.  Neck:     Thyroid: No thyromegaly.     Vascular: No JVD.  Cardiovascular:     Rate and Rhythm: Normal rate and regular rhythm.     Heart sounds: Normal heart sounds. No murmur heard.   No friction rub. No gallop.  Pulmonary:     Effort: Pulmonary effort is normal.     Breath sounds: Normal breath sounds. No wheezing or rhonchi.  Abdominal:     General: Bowel sounds are normal.     Palpations: Abdomen is soft. There is no mass.     Tenderness: There is no abdominal tenderness. There is no guarding.  Musculoskeletal:        General: Normal range of motion.     Cervical back: Normal range of motion and neck supple.  Lymphadenopathy:     Cervical: No cervical adenopathy.  Skin:    General: Skin is warm and dry.  Neurological:     Mental Status: She is alert.    Wt Readings from Last 3 Encounters:  06/05/21 175 lb 9.6 oz (79.7 kg)  05/16/21 173 lb (78.5 kg)  04/10/21 170 lb (77.1 kg)    BP 140/60   Pulse 77   Resp 16   Ht 4' 11.5" (1.511 m)   Wt 175 lb 9.6 oz (79.7 kg)   BMI 34.87 kg/m   Assessment and Plan:  1. Acute  maxillary sinusitis, recurrence not specified Acute.  Persistent.  Stable.  Exam and history is consistent with sinusitis and we will treat with Augmentin 875 mg 1 twice 25 1 twice a day for 10 days.  Patient is also been encouraged to continue her Flonase. - amoxicillin-clavulanate (AUGMENTIN) 875-125 MG tablet; Take 1 tablet by mouth 2 (two) times daily.  Dispense: 20 tablet; Refill: 0  2. Acute cough New onset.  Persistent.  Irritating but stable.  We will treat with Robitussin-AC teaspoon every 8 hours as needed for cough. - guaiFENesin-codeine (ROBITUSSIN AC) 100-10 MG/5ML syrup; Take 5 mLs by mouth 3 (three) times daily as needed for cough.  Dispense: 118 mL; Refill: 0

## 2021-06-21 IMAGING — MG DIGITAL SCREENING BILAT W/ TOMO W/ CAD
8 series · 8 of 24 positions shown · non-contrast
Comparison: Previous exam(s).

CLINICAL DATA: Screening.

EXAM:
DIGITAL SCREENING BILATERAL MAMMOGRAM WITH TOMO AND CAD

[L CC synth-2D]
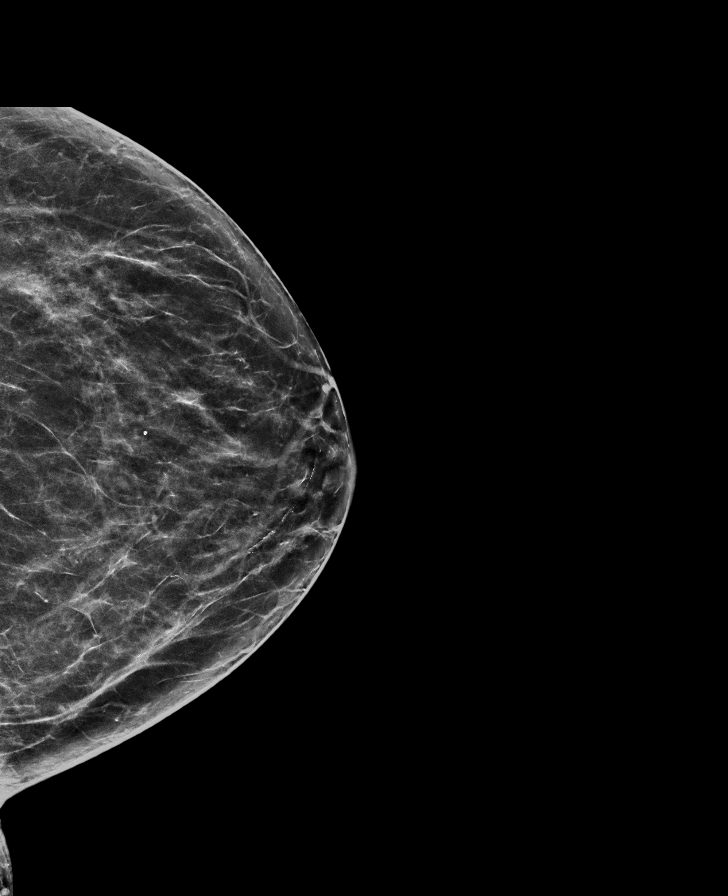

[R CC synth-2D]
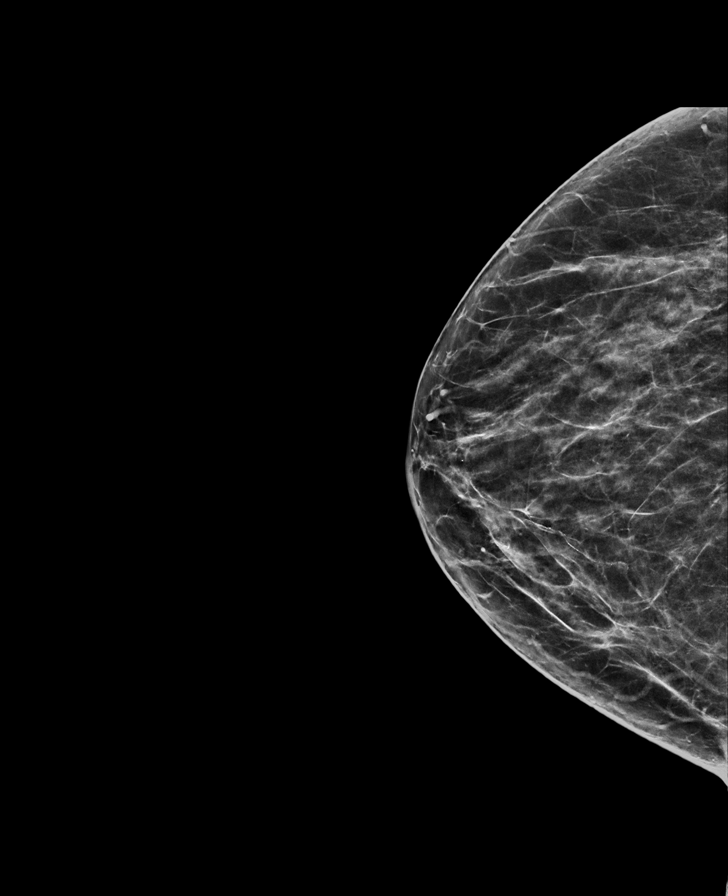

[L MLO synth-2D]
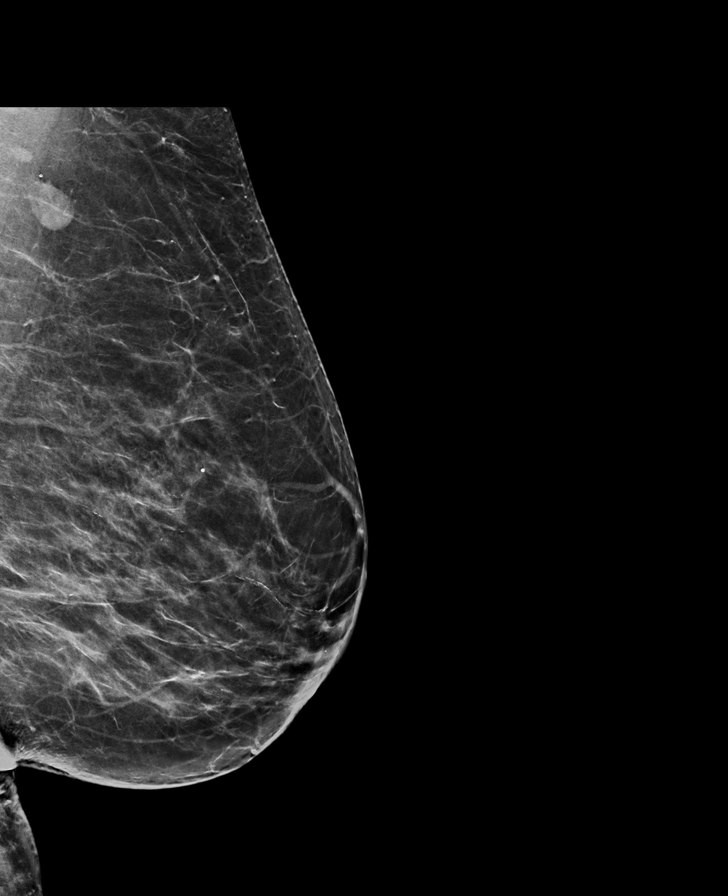

[R MLO synth-2D]
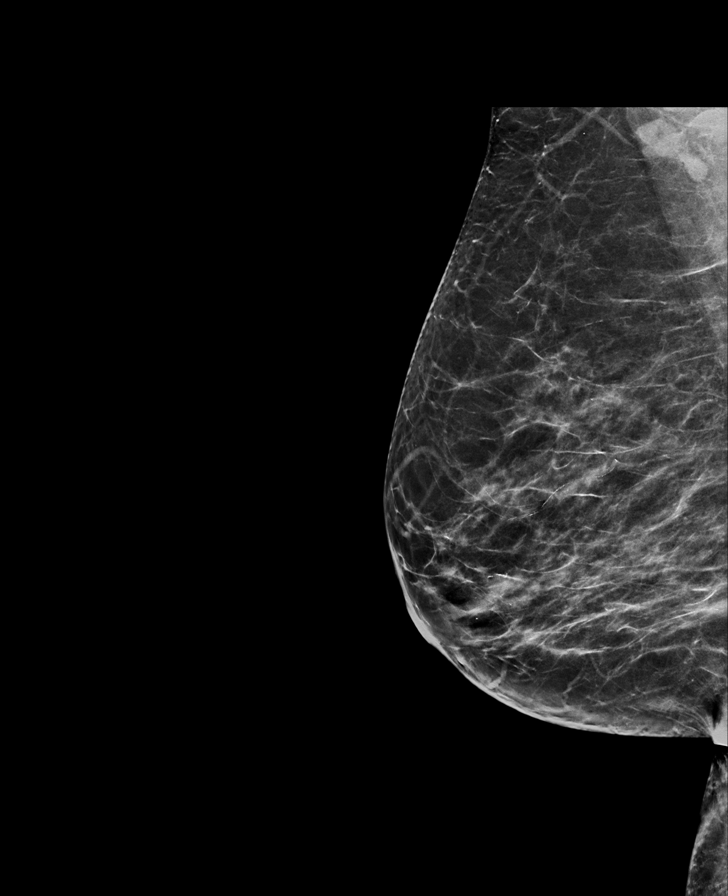

[R MLO tomo · tomo slice 37/72.0]
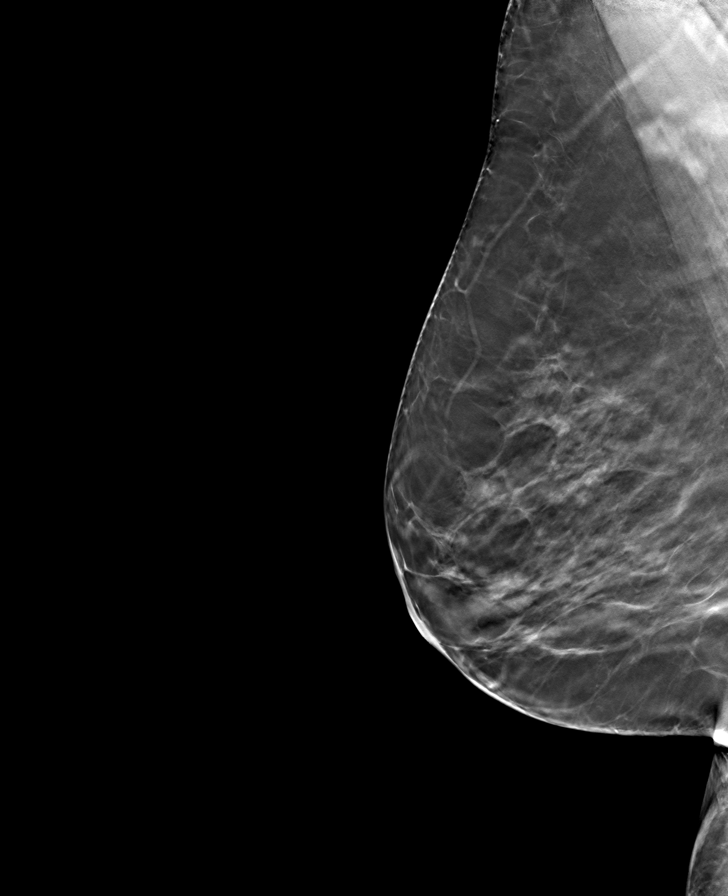

[L CC tomo · tomo slice 35/69.0]
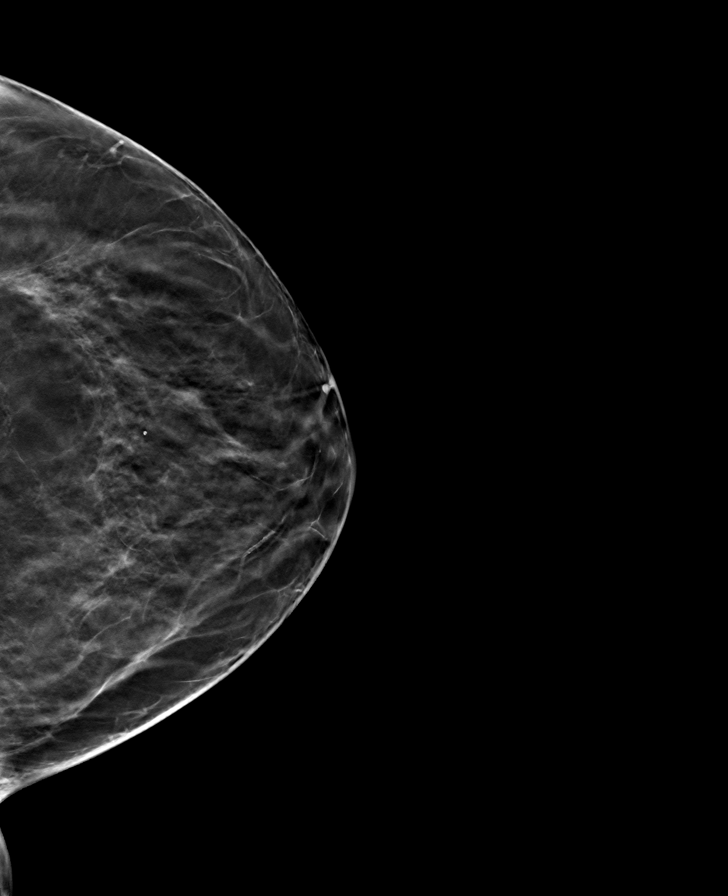

[R CC tomo · tomo slice 33/66.0]
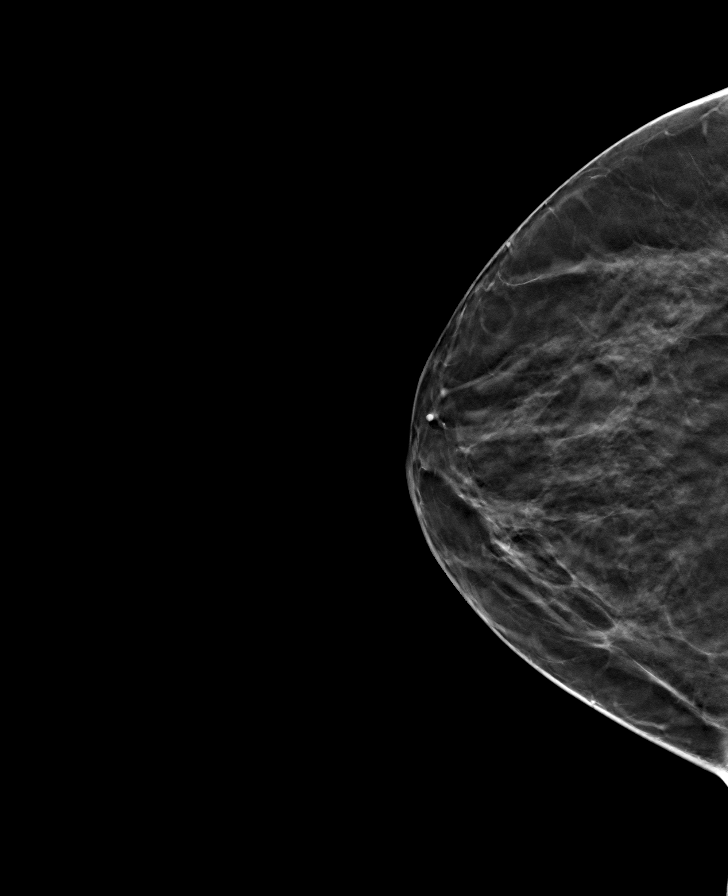

[L MLO tomo · tomo slice 39/76.0]
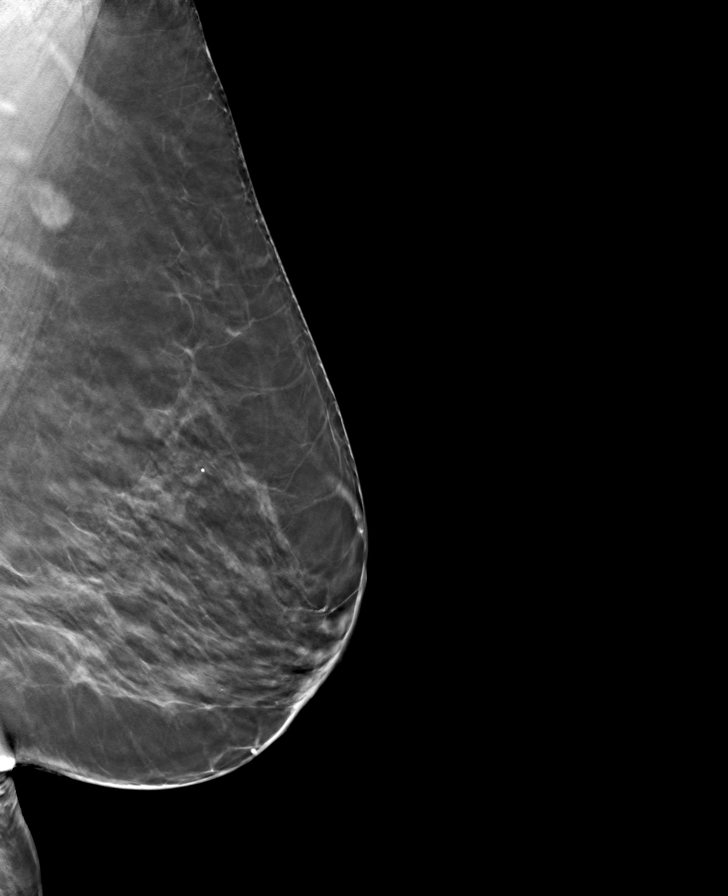

[8 of 24 positions shown; findings below may reference images not displayed]

ACR Breast Density Category b: There are scattered areas of
fibroglandular density.
FINDINGS: There are no findings suspicious for malignancy. Images were
processed with CAD.
IMPRESSION: No mammographic evidence of malignancy. A result letter of this
screening mammogram will be mailed directly to the patient.

RECOMMENDATION:
Screening mammogram in one year. (Code:[id number])

BI-RADS CATEGORY  1: Negative.

## 2021-07-23 HISTORY — PX: BREAST EXCISIONAL BIOPSY: SUR124

## 2021-08-14 ENCOUNTER — Other Ambulatory Visit: Payer: Self-pay

## 2021-08-14 ENCOUNTER — Other Ambulatory Visit: Payer: Self-pay | Admitting: Family Medicine

## 2021-08-14 ENCOUNTER — Ambulatory Visit
Admission: RE | Admit: 2021-08-14 | Discharge: 2021-08-14 | Disposition: A | Discharge status: Home / Self Care | Payer: PPO | Source: Ambulatory Visit | Attending: Family Medicine | Admitting: Family Medicine

## 2021-08-14 DIAGNOSIS — R921 Mammographic calcification found on diagnostic imaging of breast: Secondary | ICD-10-CM

## 2021-08-14 DIAGNOSIS — Z1231 Encounter for screening mammogram for malignant neoplasm of breast: Secondary | ICD-10-CM | POA: Insufficient documentation

## 2021-08-14 DIAGNOSIS — R928 Other abnormal and inconclusive findings on diagnostic imaging of breast: Secondary | ICD-10-CM

## 2021-08-30 ENCOUNTER — Other Ambulatory Visit: Payer: Self-pay

## 2021-08-30 ENCOUNTER — Ambulatory Visit
Admission: RE | Admit: 2021-08-30 | Discharge: 2021-08-30 | Disposition: A | Discharge status: Home / Self Care | Payer: PPO | Source: Ambulatory Visit | Attending: Family Medicine | Admitting: Family Medicine

## 2021-08-30 DIAGNOSIS — R921 Mammographic calcification found on diagnostic imaging of breast: Secondary | ICD-10-CM | POA: Diagnosis not present

## 2021-08-30 DIAGNOSIS — R928 Other abnormal and inconclusive findings on diagnostic imaging of breast: Secondary | ICD-10-CM | POA: Diagnosis not present

## 2021-08-30 DIAGNOSIS — R922 Inconclusive mammogram: Secondary | ICD-10-CM | POA: Diagnosis not present

## 2021-08-31 ENCOUNTER — Other Ambulatory Visit: Payer: Self-pay | Admitting: Family Medicine

## 2021-08-31 ENCOUNTER — Telehealth: Payer: Self-pay | Admitting: Family Medicine

## 2021-08-31 DIAGNOSIS — R928 Other abnormal and inconclusive findings on diagnostic imaging of breast: Secondary | ICD-10-CM

## 2021-08-31 DIAGNOSIS — R921 Mammographic calcification found on diagnostic imaging of breast: Secondary | ICD-10-CM

## 2021-08-31 NOTE — Telephone Encounter (Signed)
Copied from [place] #[id number]. Topic: General - Other >> Aug 31, 2021  3:52 PM Pawlus, [name] wrote: Reason for CRM: Pt called in stating she does not currently have access to MyChart and requested to speak with Abigail Malone regarding lab results, please advise.

## 2021-09-05 ENCOUNTER — Telehealth: Payer: Self-pay | Admitting: Family Medicine

## 2021-09-05 NOTE — Telephone Encounter (Signed)
Copied from CRM [id number]. Topic: Medicare AWV >> Sep 05, 2021 10:00 AM Claudette Laws R wrote: Reason for CRM:  Left message for patient to call back and schedule Medicare Annual Wellness Visit (AWV) in office.   If unable to come into the office for AWV,  please offer to do virtually or by telephone.  No hx of AWV eligible for AWVI as of 12/21/2016 per [facility]  Please schedule at anytime with [facility] Health Advisor.      40 Minutes appointment   Any questions, please call me at [phone]

## 2021-09-07 ENCOUNTER — Other Ambulatory Visit: Payer: Self-pay

## 2021-09-07 ENCOUNTER — Ambulatory Visit
Admission: RE | Admit: 2021-09-07 | Discharge: 2021-09-07 | Disposition: A | Discharge status: Home / Self Care | Payer: PPO | Source: Ambulatory Visit | Attending: Family Medicine | Admitting: Family Medicine

## 2021-09-07 DIAGNOSIS — R921 Mammographic calcification found on diagnostic imaging of breast: Secondary | ICD-10-CM | POA: Diagnosis not present

## 2021-09-07 DIAGNOSIS — N6081 Other benign mammary dysplasias of right breast: Secondary | ICD-10-CM | POA: Diagnosis not present

## 2021-09-07 DIAGNOSIS — R928 Other abnormal and inconclusive findings on diagnostic imaging of breast: Secondary | ICD-10-CM | POA: Insufficient documentation

## 2021-09-07 HISTORY — PX: BREAST BIOPSY: SHX20

## 2021-09-08 ENCOUNTER — Encounter: Payer: Self-pay | Admitting: *Deleted

## 2021-09-08 LAB — SURGICAL PATHOLOGY

## 2021-09-08 NOTE — Progress Notes (Signed)
Received message from Electa Sniff, RN, from [facility] that patient has been informed of her benign breast biopsy and need for surgical consultation.  States patient is ready for navigation services.  Called patient.  She would like to discuss with her husband and get back with me on Monday to schedule her an appointment.

## 2021-09-11 ENCOUNTER — Encounter: Payer: Self-pay | Admitting: *Deleted

## 2021-09-11 NOTE — Progress Notes (Signed)
Called patient again today to schedule a surgical consult for her ADH.  Patient is very indecisive.  Offered to contact  Dr. Barnett Applebaum office to assist with the referral.  States she has a "good rappore" with her office and would like for them to make the referral.  Message sent to Dr. Yetta Barre.

## 2021-09-13 ENCOUNTER — Encounter: Payer: Self-pay | Admitting: *Deleted

## 2021-09-13 NOTE — Progress Notes (Signed)
Talked to Dr. Ronnald Ramp office yesterday.  She would like to refer patient to [facility].  Spoke to patient this morning.  I have her scheduled to see Dr. Christian Mate on 09/19/21 @ 11:30.

## 2021-09-18 NOTE — Progress Notes (Signed)
Spoke to patient today.  She is scheduled to see Dr. Claudine Mouton on 09/19/21 for a surgical consultation.

## 2021-09-19 ENCOUNTER — Ambulatory Visit (INDEPENDENT_AMBULATORY_CARE_PROVIDER_SITE_OTHER): Payer: PPO | Admitting: Surgery

## 2021-09-19 ENCOUNTER — Encounter: Payer: Self-pay | Admitting: Surgery

## 2021-09-19 ENCOUNTER — Other Ambulatory Visit: Payer: Self-pay

## 2021-09-19 VITALS — BP 211/121 | HR 75 | Temp 98.5°F | Ht 59.5 in | Wt 172.6 lb

## 2021-09-19 DIAGNOSIS — N6091 Unspecified benign mammary dysplasia of right breast: Secondary | ICD-10-CM

## 2021-09-19 HISTORY — DX: Unspecified benign mammary dysplasia of right breast: N60.91

## 2021-09-19 NOTE — Progress Notes (Addendum)
Patient ID: Abigail Malone, female   DOB: 12-11-1950, 71 y.o.   MRN: [id number]  Chief Complaint: ADH right breast  History of Present Illness Abigail Malone is a 71 y.o. female with history stereotactic core biopsy of calcifications confirming atypical ductal hyperplasia.  This was a screening mammogram.  Patient reports she thought there were was a lump in the area, though we are not certain this is the same site.  There is a 1.2 cm area of pleomorphic calcifications that prompted the biopsy. She denies any history of family breast cancer, she is gravida 2 para 2 with her first pregnancy at the age 36.  She took birth control in her younger years, no hormonal replacement following menopause.  She denies any nipple discharge, skin changes or breast pain.  Past Medical History Past Medical History:  Diagnosis Date   Abnormal uterine bleeding (AUB) 05/15/2016   Allergic rhinitis, seasonal 12/09/2014   Anemia, unspecified 11/18/2013   HTN (hypertension) 11/18/2013   Hypertension    Menopausal symptoms 08/05/2014   Right knee pain 07/26/2015      Past Surgical History:  Procedure Laterality Date   BREAST BIOPSY Right 09/07/2021   stereo calcs, ribbon clip, path pending.   COLONOSCOPY  ~2018    Allergies  Allergen Reactions   Amlodipine     Other reaction(s): Other (See Comments) Leg cramping    Current Outpatient Medications  Medication Sig Dispense Refill   conjugated estrogens (PREMARIN) vaginal cream Estrogen Cream Instruction Discard applicator Apply pea sized amount to tip of finger to urethra before bed. Wash hands well after application. Use Monday, Wednesday and Friday 30 g 12   No current facility-administered medications for this visit.    Family History Family History  Problem Relation Age of Onset   Cancer Mother    Stroke Mother    Hypertension Mother    Hematuria Mother    Liver cancer Mother    Cancer Father    Prostate cancer Father    Kidney failure  Brother    Breast cancer Neg Hx       Social History Social History   Tobacco Use   Smoking status: Former    Packs/day: 0.50    Years: 8.00    Pack years: 4.00    Types: Cigarettes    Quit date: 12/22/2006    Years since quitting: 14.7   Smokeless tobacco: Never  Vaping Use   Vaping Use: Never used  Substance Use Topics   Alcohol use: Not Currently   Drug use: No        Review of Systems  Constitutional: Negative.   HENT: Negative.    Eyes:  Positive for blurred vision.  Respiratory: Negative.    Cardiovascular: Negative.   Gastrointestinal: Negative.   Genitourinary: Negative.   Musculoskeletal:  Positive for joint pain.  Skin: Negative.   Neurological: Negative.   Psychiatric/Behavioral:  Positive for depression.      Physical Exam Blood pressure (!) 211/121, pulse 75, temperature 98.5 F (36.9 C), temperature source Oral, height 4' 11.5" (1.511 m), weight 172 lb 9.6 oz (78.3 kg), SpO2 99 %. Last Weight  Most recent update: 09/19/2021 11:20 AM    Weight  78.3 kg (172 lb 9.6 oz)             CONSTITUTIONAL: Well developed, and nourished, appropriately responsive and aware without distress.   EYES: Sclera non-icteric.   EARS, NOSE, MOUTH AND THROAT: Mask worn.  Hearing is intact to  voice.  NECK: Trachea is midline, and there is no jugular venous distension.  LYMPH NODES:  Lymph nodes in the neck are not enlarged. RESPIRATORY:  Lungs are clear, and breath sounds are equal bilaterally. Normal respiratory effort without pathologic use of accessory muscles. CARDIOVASCULAR: Heart is regular in rate and rhythm. GI: The abdomen is soft, nontender, and nondistended.  GU: Levada Dy is present as chaperone: There is a small indistinct density adjacent to the right lateral breast biopsy site.  No other appreciable masses or dominant, nor suspicious nodularity. MUSCULOSKELETAL:  Symmetrical muscle tone appreciated in all four extremities.    SKIN: Skin turgor is normal. No  pathologic skin lesions appreciated.  NEUROLOGIC:  Motor and sensation appear grossly normal.  Cranial nerves are grossly without defect. PSYCH:  Alert and oriented to person, place and time. Affect is appropriate for situation.  Data Reviewed I have personally reviewed what is currently available of the patient's imaging, recent labs and medical records.   Labs:  CBC Latest Ref Rng & Units 05/16/2021  WBC 3.4 - 10.8 x10E3/uL 8.7  Hemoglobin 11.1 - 15.9 g/dL 11.4  Hematocrit 34.0 - 46.6 % 34.5  Platelets 150 - 450 x10E3/uL 268   CMP Latest Ref Rng & Units 05/16/2021  Glucose 70 - 99 mg/dL 90  BUN 8 - 27 mg/dL 11  Creatinine 0.57 - 1.00 mg/dL 0.66  Sodium 134 - 144 mmol/L 141  Potassium 3.5 - 5.2 mmol/L 3.8  Chloride 96 - 106 mmol/L 101  CO2 20 - 29 mmol/L 28  Calcium 8.7 - 10.3 mg/dL 9.8   SURGICAL PATHOLOGY  CASE: [id number]  PATIENT: Abigail Malone  Surgical Pathology Report   Specimen Submitted:  A. Breast, right   Clinical History: Grouped pleomorphic CALCS, indeterminate, r/o atypia  or DCIS. Ribbon-shaped clip placed following stereotactic biopsy of  RIGHT breast, upper outer quadrant.   DIAGNOSIS:  A. BREAST, RIGHT UPPER OUTER QUADRANT; STEREOTACTIC CORE NEEDLE BIOPSY:  - ATYPICAL DUCTAL HYPERPLASIA.  - BACKGROUND FIBROCYSTIC AND COLUMNAR CELL CHANGE, WITH FOCAL  PSEUDOANGIOMATOUS STROMAL HYPERPLASIA.  - CALCIFICATIONS ASSOCIATED WITH BENIGN AND ATYPICAL MAMMARY ELEMENTS.  - NEGATIVE FOR DUCTAL CARCINOMA IN SITU AND MALIGNANCY.   Comment:  Atypical ductal hyperplasia is identified in 3 of 5 sampled tissue  blocks, spanning up to 1 mm in greatest linear extent.   GROSS DESCRIPTION:  A. Labeled: Right breast stereo calcs outer breast  Received: in a formalin-filled [place] collection device  Specimen radiograph image(s) available for review  Time/Date in fixative: Collected at 1:28 PM on 09/07/2021 and placed in  formalin at 1:30 PM on 09/07/2021  Cold  ischemic time: Approximately 2 minutes  Total fixation time: Approximately 7 hours  Core pieces: Multiple  Measurement: Aggregate, 1.3 x 1.0 x 0.7 cm  Description / comments: Received are yellow fibrofatty fragments of  tissue.  A diagram is provided on the specimen container lid, and  sections A, B, C, and E are checked  Inked: Green  Entirely submitted in cassette(s):    1-section A  2-section B  3-section C  4-section E  5-sections D and F   CM 09/07/2021   Final Diagnosis performed by Allena Napoleon, MD.   Electronically signed  09/08/2021 11:05:07AM  The electronic signature indicates that the named Attending Pathologist  has evaluated the specimen  Technical component performed at [facility], [address], [place],  [place] [address] Lab: [phone] Dir: Rush Farmer, MD, MMM   Professional component performed at [facility], [facility], [place], [place], [place] [address] Lab: [phone]  Dir: Kathi Simpers, MD    Imaging: Radiology review:  CLINICAL DATA:  Right breast calcifications seen on most recent screening mammography.   EXAM: DIGITAL DIAGNOSTIC UNILATERAL RIGHT MAMMOGRAM WITH TOMOSYNTHESIS AND CAD   TECHNIQUE: Right digital diagnostic mammography and breast tomosynthesis was performed. The images were evaluated with computer-aided detection.   COMPARISON:  Previous exam(s).   ACR Breast Density Category b: There are scattered areas of fibroglandular density.   FINDINGS: Additional mammographic views of the right breast demonstrate a 1.2 cm group of indeterminate calcifications in the lower outer right breast, middle to posterior depth. No associated mass.   IMPRESSION: Indeterminate right breast calcifications, for which stereotactic core needle biopsy is recommended.   RECOMMENDATION: One site stereotactic core needle biopsy of the right breast.   I have discussed the findings and recommendations with the patient. If  applicable, a reminder letter will be sent to the patient regarding the next appointment.   BI-RADS CATEGORY  4: Suspicious.     Electronically Signed   By: Fidela Salisbury M.D.   On: 08/30/2021 10:53 Within last 24 hrs: No results found.  Assessment    ADH right breast Patient Active Problem List   Diagnosis Date Noted   Obesity with body mass index (BMI) of 30.0 to 39.9 01/01/2020   Cardiac syncope 05/18/2019   Hyperlipidemia, mixed 05/18/2019   Mixed stress and urge urinary incontinence 04/01/2018   Abnormal uterine bleeding (AUB) 05/15/2016   Right knee pain 07/26/2015   Allergic rhinitis, seasonal 12/09/2014   Menopausal symptoms 08/05/2014   HTN (hypertension) 11/18/2013   Anemia, unspecified 11/18/2013    Plan    We discussed the need to ensure that there is no additional pathology aside from what we currently know now.  I assured her this is not cancer, but may be a precursor or at least increase her risk of developing future breast cancer.  I reminded her that the standard is to remove all known disease, and likely prophylaxis with antiestrogen management to follow-up. She did not have a pleasant experience with her biopsy, and was hoping to avoid additional biopsy/surgery. We discussed the risks of placement out of the RFID tag, the eventual excisional biopsy, associated with anesthesia, bleeding, hematoma/seroma, need for additional procedures based on newly identified pathology.  Though reluctant, I believe she is willing to proceed and follow through with her plan. Face-to-face time spent with the patient and accompanying care providers(if present) was 45 minutes, with more than 50% of the time spent counseling, educating, and coordinating care of the patient.    These notes generated with voice recognition software. I apologize for typographical errors.  Ronny Bacon M.D., FACS 09/19/2021, 11:22 AM

## 2021-09-19 NOTE — Patient Instructions (Addendum)
Our surgery scheduler Pamala Hurry will call you within 24-48 hours to get you scheduled. If you have not heard from her after 48 hours, please call our office. You will not need to get Covid tested before surgery and have the blue sheet available when she calls to write down important information.   If you have any concerns or questions, please feel free to call our office.    Atypical Hyperplasia of the Breast Atypical hyperplasia of the breast is a condition in which some cells in the breast are unusual or abnormal. The cells can be found in the ducts or lobules of the breast. Compared to normal breast cells, the cells seen in atypical hyperplasia: Grow faster than normal breast cells. Are organized in an unusual way. Grow in larger numbers than normal breast cells (overproduction). Are unusual in size and shape. Atypical hyperplasia is not a form of breast cancer. However, it may: Make you more likely to develop breast cancer. Turn into cancer (pre-cancerous), if it is not treated. What are the causes? The cause of this condition is not known. What increases the risk? The following factors may make you more likely to develop this condition: Age. Your risk increases as you get older. Certain inherited genes. Family or personal history of breast cancer. Having prior radiation treatments to your breasts or chest area. Obesity. Using or having used hormones, such as estrogen. Starting menstruation before the age of 53. Going through menopause after age 37. Drinking more than one alcoholic beverage a day. Never having given birth. Giving birth to your first child when you were over the age of 28. Not breastfeeding, if you have given birth. What are the signs or symptoms? There are no symptoms of this condition. How is this diagnosed? This condition is usually discovered during a routine X-ray study of the breasts (mammogram). If something concerning is found on a mammogram, a biopsy is  performed. This means that a small sample of breast tissue is removed and sent to a lab. In most cases, breast cells can be removed through a needle. Sometimes, a small cut (incision) will need to be made in the breast to remove a sample of breast tissue. Atypical hyperplasia can then be diagnosed by examining the breast cells under a microscope. How is this treated? This condition may be treated with: More frequent monitoring of breast health with: Clinical breast exams. Mammograms. Ultrasounds. MRIs. Medicines to keep the abnormal cells from spreading and becoming cancerous (chemoprevention). A lumpectomy. This is the removal of the area of abnormal cells, along with some of the normal tissue in the area. This may also be called breast-conserving surgery. A simple mastectomy. This is the removal of breast tissue, the nipple, and the circle of colored tissue around the nipple (areola). Sometimes, one or more lymph nodes from under the arm are also removed. A preventive mastectomy. This is the removal of both breasts. This is usually only done if you have a very high risk of developing breast cancer. Follow these instructions at home: Breast health Examine your breasts after every menstrual period. If you do not have menstrual periods, check your breasts on the first day of every month. Feel for changes in your breasts, such as: More tenderness. A new growth. A change in size or shape. A change in an existing lump. Alcohol use Do not drink alcohol if: Your health care provider tells you not to drink. You are pregnant, may be pregnant, or are planning to become pregnant.  If you drink alcohol, limit how much you have to 0-1 drink a day. Be aware of how much alcohol is in your drink. In the [place] one drink equals one typical bottle of beer (12 oz), one-half glass of wine (5 oz), or one shot of hard liquor (1 oz). General instructions Take over-the-counter and prescription medicines only as  told by your health care provider. Eat a healthy diet. This includes low-fat dairy products, lean meats, and fiber-rich foods, such as fruits, vegetables, and whole grains. To get more fiber: Make sure half your plate is filled with fruits or vegetables. Choose high-fiber foods such as whole-grain breads and cereals. Do not use any products that contain nicotine or tobacco, such as cigarettes and e-cigarettes. If you need help quitting, ask your health care provider. Keep all follow-up visits as told by your health care provider. This is important. Contact a health care provider if you have: A fever. A change in shape or size of your breasts or nipples. A change in the skin of your breast or nipples, such as a reddened or scaly area. Unusual discharge from your nipples. A lump or thick area that was not there before. Pain in your breasts. Anything that concerns you. Get help right away if you have: Trouble breathing. Chest pain. Redness of your breast and the redness is spreading. Summary Atypical hyperplasia of the breast is a condition in which some cells in the breast are unusual or abnormal. Atypical hyperplasia is not a form of breast cancer. However, it may put you at greater risk for developing breast cancer. This condition is usually discovered during a routine X-ray study of the breasts (mammogram). Examine your breasts after every menstrual period. If you do not have menstrual periods, check your breasts on the first day of every month. Let your health care provider know if you notice any changes in your breasts. This information is not intended to replace advice given to you by your health care provider. Make sure you discuss any questions you have with your health care provider. Document Revised: 08/13/2019 Document Reviewed: 08/08/2017 Elsevier Patient Education  2022 [employer].

## 2021-09-21 ENCOUNTER — Ambulatory Visit: Payer: Self-pay

## 2021-09-21 ENCOUNTER — Encounter: Payer: Self-pay | Admitting: Family Medicine

## 2021-09-21 ENCOUNTER — Other Ambulatory Visit: Payer: Self-pay | Admitting: Surgery

## 2021-09-21 ENCOUNTER — Other Ambulatory Visit: Payer: Self-pay

## 2021-09-21 ENCOUNTER — Ambulatory Visit (INDEPENDENT_AMBULATORY_CARE_PROVIDER_SITE_OTHER): Payer: PPO | Admitting: Family Medicine

## 2021-09-21 VITALS — BP 130/80 | HR 64 | Ht 59.5 in | Wt 171.0 lb

## 2021-09-21 DIAGNOSIS — J029 Acute pharyngitis, unspecified: Secondary | ICD-10-CM | POA: Diagnosis not present

## 2021-09-21 DIAGNOSIS — R928 Other abnormal and inconclusive findings on diagnostic imaging of breast: Secondary | ICD-10-CM

## 2021-09-21 DIAGNOSIS — N6092 Unspecified benign mammary dysplasia of left breast: Secondary | ICD-10-CM

## 2021-09-21 DIAGNOSIS — N6091 Unspecified benign mammary dysplasia of right breast: Secondary | ICD-10-CM

## 2021-09-21 LAB — POCT RAPID STREP A (OFFICE): Rapid Strep A Screen: NEGATIVE

## 2021-09-21 MED ORDER — MONTELUKAST SODIUM 10 MG PO TABS: 10.0000 mg | ORAL_TABLET | Freq: Every day | ORAL | 3 refills | Status: DC

## 2021-09-21 NOTE — Progress Notes (Signed)
? ? ?Date:  09/21/2021  ? ?Name:  Abigail Malone   DOB:  02/07/1951   MRN:  [id number] ? ? ?Chief Complaint: Sore Throat (Runny nose, cough- production in am, no color) ? ?Sore Throat  ?This is a new problem. The current episode started in the past 7 days (tuesday). The problem has been gradually worsening. Neither side of throat is experiencing more pain than the other. There has been no fever. The pain is at a severity of 5/10. The pain is mild. Associated symptoms include congestion and coughing. Pertinent negatives include no abdominal pain, diarrhea, drooling, ear discharge, ear pain, headaches, neck pain, shortness of breath or trouble swallowing. Associated symptoms comments: Painful to swallow. She has had exposure to strep. She has had no exposure to mono. She has tried cool liquids and acetaminophen for the symptoms.  ? ?Lab Results  ?Component Value Date  ? NA 141 05/16/2021  ? K 3.8 05/16/2021  ? CO2 28 05/16/2021  ? GLUCOSE 90 05/16/2021  ? BUN 11 05/16/2021  ? CREATININE 0.66 05/16/2021  ? CALCIUM 9.8 05/16/2021  ? EGFR 94 05/16/2021  ? ?Lab Results  ?Component Value Date  ? CHOL 223 (H) 05/16/2021  ? HDL 56 05/16/2021  ? LDLCALC 142 (H) 05/16/2021  ? TRIG 140 05/16/2021  ? ?No results found for: TSH ?No results found for: HGBA1C ?Lab Results  ?Component Value Date  ? WBC 8.7 05/16/2021  ? HGB 11.4 05/16/2021  ? HCT 34.5 05/16/2021  ? MCV 84 05/16/2021  ? PLT 268 05/16/2021  ? ?No results found for: ALT, AST, GGT, ALKPHOS, BILITOT ?No results found for: 25OHVITD2, [place], VD25OH  ? ?Review of Systems  ?Constitutional:  Negative for chills and fever.  ?HENT:  Positive for congestion. Negative for drooling, ear discharge, ear pain, sore throat and trouble swallowing.   ?Respiratory:  Positive for cough. Negative for shortness of breath and wheezing.   ?Cardiovascular:  Negative for chest pain, palpitations and leg swelling.  ?Gastrointestinal:  Negative for abdominal pain, blood in stool,  constipation, diarrhea and nausea.  ?Endocrine: Negative for polydipsia.  ?Genitourinary:  Negative for dysuria, frequency, hematuria and urgency.  ?Musculoskeletal:  Negative for back pain, myalgias and neck pain.  ?Skin:  Negative for rash.  ?Allergic/Immunologic: Negative for environmental allergies.  ?Neurological:  Negative for dizziness and headaches.  ?Hematological:  Does not bruise/bleed easily.  ?Psychiatric/Behavioral:  Negative for suicidal ideas. The patient is not nervous/anxious.   ? ?Patient Active Problem List  ? Diagnosis Date Noted  ? Atypical ductal hyperplasia of right breast 09/19/2021  ? Obesity with body mass index (BMI) of 30.0 to 39.9 01/01/2020  ? Cardiac syncope 05/18/2019  ? Hyperlipidemia, mixed 05/18/2019  ? Mixed stress and urge urinary incontinence 04/01/2018  ? Abnormal uterine bleeding (AUB) 05/15/2016  ? Right knee pain 07/26/2015  ? Allergic rhinitis, seasonal 12/09/2014  ? Menopausal symptoms 08/05/2014  ? HTN (hypertension) 11/18/2013  ? Anemia, unspecified 11/18/2013  ? ? ?Allergies  ?Allergen Reactions  ? Amlodipine   ?  Other reaction(s): Other (See Comments) ?Leg cramping  ? ? ?Past Surgical History:  ?Procedure Laterality Date  ? BREAST BIOPSY Right 09/07/2021  ? stereo calcs, ribbon clip, path pending.  ? COLONOSCOPY  ~2018  ? ? ?Social History  ? ?Tobacco Use  ? Smoking status: Former  ?  Packs/day: 0.50  ?  Years: 8.00  ?  Pack years: 4.00  ?  Types: Cigarettes  ?  Quit date: 12/22/2006  ?  Years since quitting: 14.7  ? Smokeless tobacco: Never  ?Vaping Use  ? Vaping Use: Never used  ?Substance Use Topics  ? Alcohol use: Not Currently  ? Drug use: No  ? ? ? ?Medication list has been reviewed and updated. ? ?Current Meds  ?Medication Sig  ? conjugated estrogens (PREMARIN) vaginal cream Estrogen Cream Instruction Discard applicator Apply pea sized amount to tip of finger to urethra before bed. Wash hands well after application. Use Monday, Wednesday and Friday  ? ? ?PHQ 2/9  Scores 06/05/2021 04/03/2021  ?PHQ - 2 Score 0 0  ?PHQ- 9 Score 4 0  ? ? ?GAD 7 : Generalized Anxiety Score 06/05/2021 04/03/2021  ?Nervous, Anxious, on Edge 0 0  ?Control/stop worrying 0 0  ?Worry too much - different things 0 0  ?Trouble relaxing 0 0  ?Restless 0 0  ?Easily annoyed or irritable 0 0  ?Afraid - awful might happen 0 0  ?Total GAD 7 Score 0 0  ?Anxiety Difficulty Not difficult at all -  ? ? ?BP Readings from Last 3 Encounters:  ?09/21/21 130/80  ?09/19/21 (!) 211/121  ?06/05/21 140/60  ? ? ?Physical Exam ?Vitals and nursing note reviewed.  ?Constitutional:   ?   Appearance: She is well-developed.  ?HENT:  ?   Head: Normocephalic.  ?   Right Ear: Tympanic membrane and external ear normal.  ?   Left Ear: Tympanic membrane and external ear normal.  ?   Mouth/Throat:  ?   Tonsils: No tonsillar exudate or tonsillar abscesses.  ?Eyes:  ?   General: Lids are everted, no foreign bodies appreciated. No scleral icterus.    ?   Left eye: No foreign body or hordeolum.  ?   Conjunctiva/sclera: Conjunctivae normal.  ?   Right eye: Right conjunctiva is not injected.  ?   Left eye: Left conjunctiva is not injected.  ?   Pupils: Pupils are equal, round, and reactive to light.  ?Neck:  ?   Thyroid: No thyromegaly.  ?   Vascular: Normal carotid pulses. No carotid bruit, hepatojugular reflux or JVD.  ?   Trachea: No tracheal deviation.  ?Cardiovascular:  ?   Rate and Rhythm: Normal rate and regular rhythm.  ?   Heart sounds: Normal heart sounds. No murmur heard. ?  No friction rub. No gallop.  ?Pulmonary:  ?   Effort: Pulmonary effort is normal. No respiratory distress.  ?   Breath sounds: Normal breath sounds. No wheezing or rales.  ?Abdominal:  ?   General: Bowel sounds are normal.  ?   Palpations: Abdomen is soft. There is no mass.  ?   Tenderness: There is no abdominal tenderness. There is no guarding or rebound.  ?Musculoskeletal:     ?   General: No tenderness. Normal range of motion.  ?   Cervical back: Normal range  of motion and neck supple.  ?Lymphadenopathy:  ?   Cervical: No cervical adenopathy.  ?   Right cervical: No superficial, deep or posterior cervical adenopathy. ?   Left cervical: No superficial, deep or posterior cervical adenopathy.  ?Skin: ?   General: Skin is warm.  ?   Capillary Refill: Capillary refill takes less than 2 seconds.  ?   Findings: No rash.  ?Neurological:  ?   Mental Status: She is alert and oriented to person, place, and time.  ?   Cranial Nerves: No cranial nerve deficit.  ?   Deep Tendon Reflexes: Reflexes normal.  ?  Psychiatric:     ?   Mood and Affect: Mood is not anxious or depressed.  ? ? ?Wt Readings from Last 3 Encounters:  ?09/21/21 171 lb (77.6 kg)  ?09/19/21 172 lb 9.6 oz (78.3 kg)  ?06/05/21 175 lb 9.6 oz (79.7 kg)  ? ? ?BP 130/80   Pulse 64   Ht 4' 11.5" (1.511 m)   Wt 171 lb (77.6 kg)   BMI 33.96 kg/m?  ? ?Assessment and Plan: ? ?1. Pharyngitis, unspecified etiology ?New onset.  Persistent.  Relatively stable.  She can swallow but there is some discomfort.  There is mild erythema but no exudates and no swelling of the pharyngeal or tonsillar areas.  Rapid strep test is negative.  Since the pain of the throat has been described as a 7/10 and her grandson has had strep and she has had exposure to him, we will obtain a follow-up throat culture to be sent.  In the meantime patient has been encouraged to use acetaminophen for discomfort, increase fluids of a cool nature, and Singulair has been given for upper respiratory congestion.  We do not have access to throat culture and therefore we will give it some time and if the pain has not resolved in 24 hours patient is encouraged to be reevaluated. ? ? ?

## 2021-09-21 NOTE — Telephone Encounter (Signed)
? ?  Chief Complaint: High blood pressure 09/19/21 in Dr. Forest Becker office. ?Symptoms: Had blurred vision that day, but none today ?Frequency: 2 days ago ?Pertinent Negatives: Patient denies any headache or blurred vision ?Disposition: [] ED /[] Urgent Care (no appt availability in office) / [x] Appointment(In office/virtual)/ []  [facility]/ [] Home Care/ [] Refused Recommended Disposition /[]  Mobile Bus/ []  Follow-up with PCP ?Additional Notes: Complaining of sore throat, cough, runny nose today.  ?Answer Assessment - Initial Assessment Questions ?1. BLOOD PRESSURE: "What is the blood pressure?" "Did you take at least two measurements 5 minutes apart?" ?    200/110 ?2. ONSET: "When did you take your blood pressure?" ?    09/19/21 ?3. HOW: "How did you obtain the blood pressure?" (e.g., visiting nurse, automatic home BP monitor) ?    Dr.'s office ?4. HISTORY: "Do you have a history of high blood pressure?" ?    Yes ?5. MEDICATIONS: "Are you taking any medications for blood pressure?" "Have you missed any doses recently?" ?    No medications ?6. OTHER SYMPTOMS: "Do you have any symptoms?" (e.g., headache, chest pain, blurred vision, difficulty breathing, weakness) ?    Has a sore throat x 2 days, runny nose, cough ?7. PREGNANCY: "Is there any chance you are pregnant?" "When was your last menstrual period?" ?    No ? ?Protocols used: Blood Pressure - High-A-AH ? ?

## 2021-09-22 ENCOUNTER — Telehealth: Payer: Self-pay | Admitting: Surgery

## 2021-09-22 NOTE — Telephone Encounter (Signed)
Patient has been advised of Pre-Admission date/time, COVID Testing date and Surgery date. ? ?Surgery Date: 10/18/21 ?Preadmission Testing Date: 10/11/21 (phone 8a-1p) ?Covid Testing Date: Not needed.   ? ?Patient has been made aware to call [phone], between 1-3:00pm the day before surgery, to find out what time to arrive for surgery.   ? ?Also patient reminded of her RF tag to be placed at the [facility] on 10/03/21 arriving 3:30.   ?Patient verbalized understanding.   ?

## 2021-10-02 ENCOUNTER — Other Ambulatory Visit: Payer: PPO

## 2021-10-03 ENCOUNTER — Other Ambulatory Visit: Payer: Self-pay

## 2021-10-03 ENCOUNTER — Ambulatory Visit
Admission: RE | Admit: 2021-10-03 | Discharge: 2021-10-03 | Disposition: A | Discharge status: Home / Self Care | Payer: PPO | Source: Ambulatory Visit | Attending: Surgery | Admitting: Surgery

## 2021-10-03 DIAGNOSIS — R928 Other abnormal and inconclusive findings on diagnostic imaging of breast: Secondary | ICD-10-CM | POA: Diagnosis not present

## 2021-10-03 DIAGNOSIS — N6091 Unspecified benign mammary dysplasia of right breast: Secondary | ICD-10-CM | POA: Insufficient documentation

## 2021-10-05 ENCOUNTER — Ambulatory Visit: Payer: PPO

## 2021-10-11 ENCOUNTER — Ambulatory Visit: Payer: Self-pay | Admitting: Surgery

## 2021-10-11 ENCOUNTER — Other Ambulatory Visit: Payer: Self-pay

## 2021-10-11 ENCOUNTER — Other Ambulatory Visit
Admission: RE | Admit: 2021-10-11 | Discharge: 2021-10-11 | Disposition: A | Discharge status: Home / Self Care | Payer: PPO | Source: Ambulatory Visit | Attending: Surgery | Admitting: Surgery

## 2021-10-11 DIAGNOSIS — N6091 Unspecified benign mammary dysplasia of right breast: Secondary | ICD-10-CM

## 2021-10-11 NOTE — Patient Instructions (Addendum)
?Your procedure is scheduled on: Wednesday October 18, 2021. ?Report to Day Surgery inside [facility] 2nd floor, stop by admissions desk before getting on elevator.  ?To find out your arrival time please call [phone] between 1PM - 3PM on Tuesday October 17, 2021. ? ?Remember: Instructions that are not followed completely may result in serious medical risk,  ?up to and including death, or upon the discretion of your surgeon and anesthesiologist your  ?surgery may need to be rescheduled.  ? ?  _X__ 1. Do not eat food after midnight the night before your procedure. ?                No chewing gum or hard candies.  ? ?__X__2.  On the morning of surgery brush your teeth with toothpaste and water, you ?               may rinse your mouth with mouthwash if you wish.  Do not swallow any toothpaste or mouthwash. ?   ? _X__ 3.  No Alcohol for 24 hours before or after surgery. ? ? _X__ 4.  Do Not Smoke or use e-cigarettes For 24 Hours Prior to Your Surgery. ?                Do not use any chewable tobacco products for at least 6 hours prior to ?                Surgery. ? ?_X__  5.  Do not use any recreational drugs (marijuana, cocaine, heroin, ecstasy, MDMA or other) ?               For at least one week prior to your surgery.  Combination of these drugs with anesthesia ?               May have life threatening results. ? ?____  6.  Bring all medications with you on the day of surgery if instructed.  ? ?__X__  7.  Notify your doctor if there is any change in your medical condition  ?    (cold, fever, infections). ?    ?Do not wear jewelry, make-up, hairpins, clips or nail polish. ?Do not wear lotions, powders, perfumes or deodorant. ?Do not shave 48 hours prior to surgery.  ?Do not bring valuables to the hospital.   ? ?[facility] is not responsible for any belongings or valuables. ? ?Contacts, dentures or bridgework may not be worn into surgery. ?Leave your suitcase in the car. After surgery it may  be brought to your room. ?For patients admitted to the hospital, discharge time is determined by your ?treatment team. ?  ?Patients discharged the day of surgery will not be allowed to drive home.   ?Make arrangements for someone to be with you for the first 24 hours of your ?Same Day Discharge. ? ? ?__X__ Take these medicines the morning of surgery with A SIP OF WATER:  ? ? 1. oxybutynin (DITROPAN-XL) 10 MG 24 ? 2.  ? 3.  ? 4. ? 5. ? 6. ? ?____ Fleet Enema (as directed)  ? ?__X__ Use CHG Soap (or wipes) as directed ? ?____ Use Benzoyl Peroxide Gel as instructed ? ?____ Use inhalers on the day of surgery ? ?____ Stop metformin 2 days prior to surgery   ? ?____ Take 1/2 of usual insulin dose the night before surgery. No insulin the morning ?         of surgery.  ? ?  ____ Call your PCP, cardiologist, or Pulmonologist if taking Coumadin/Plavix/aspirin and ask when to stop before your surgery.  ? ?__X__ One Week prior to surgery- Stop Anti-inflammatories such as Ibuprofen, Aleve, Advil, Motrin, meloxicam (MOBIC), diclofenac, etodolac, ketorolac, Toradol, Daypro, piroxicam, Goody's or BC powders. OK TO USE TYLENOL IF NEEDED ?  ?__X__ One Week prior to surgery- Stop ALL supplements until after surgery.   ? ?____ Bring C-Pap to the hospital.  ? ? ?If you have any questions regarding your pre-procedure instructions,  ?Please call Pre-admit Testing at [phone] ?

## 2021-10-12 ENCOUNTER — Other Ambulatory Visit
Admission: RE | Admit: 2021-10-12 | Discharge: 2021-10-12 | Disposition: A | Discharge status: Home / Self Care | Payer: PPO | Source: Ambulatory Visit | Attending: Surgery | Admitting: Surgery

## 2021-10-12 DIAGNOSIS — N6091 Unspecified benign mammary dysplasia of right breast: Secondary | ICD-10-CM | POA: Insufficient documentation

## 2021-10-12 DIAGNOSIS — Z01810 Encounter for preprocedural cardiovascular examination: Secondary | ICD-10-CM | POA: Diagnosis not present

## 2021-10-12 DIAGNOSIS — Z01818 Encounter for other preprocedural examination: Secondary | ICD-10-CM | POA: Diagnosis not present

## 2021-10-12 LAB — CBC WITH DIFFERENTIAL/PLATELET
Abs Immature Granulocytes: 0.03 10*3/uL (ref 0.00–0.07)
Basophils Absolute: 0.1 10*3/uL (ref 0.0–0.1)
Basophils Relative: 1 %
Eosinophils Absolute: 0.1 10*3/uL (ref 0.0–0.5)
Eosinophils Relative: 2 %
HCT: 34.9 % — ABNORMAL LOW (ref 36.0–46.0)
Hemoglobin: 11.3 g/dL — ABNORMAL LOW (ref 12.0–15.0)
Immature Granulocytes: 0 %
Lymphocytes Relative: 26 %
Lymphs Abs: 2 10*3/uL (ref 0.7–4.0)
MCH: 26 pg (ref 26.0–34.0)
MCHC: 32.4 g/dL (ref 30.0–36.0)
MCV: 80.4 fL (ref 80.0–100.0)
Monocytes Absolute: 0.6 10*3/uL (ref 0.1–1.0)
Monocytes Relative: 7 %
Neutro Abs: 4.8 10*3/uL (ref 1.7–7.7)
Neutrophils Relative %: 64 %
Platelets: 257 10*3/uL (ref 150–400)
RBC: 4.34 MIL/uL (ref 3.87–5.11)
RDW: 14.2 % (ref 11.5–15.5)
WBC: 7.6 10*3/uL (ref 4.0–10.5)
nRBC: 0 % (ref 0.0–0.2)

## 2021-10-12 LAB — COMPREHENSIVE METABOLIC PANEL
ALT: 12 U/L (ref 0–44)
AST: 18 U/L (ref 15–41)
Albumin: 3.7 g/dL (ref 3.5–5.0)
Alkaline Phosphatase: 71 U/L (ref 38–126)
Anion gap: 13 (ref 5–15)
BUN: 10 mg/dL (ref 8–23)
CO2: 26 mmol/L (ref 22–32)
Calcium: 9.4 mg/dL (ref 8.9–10.3)
Chloride: 102 mmol/L (ref 98–111)
Creatinine, Ser: 0.56 mg/dL (ref 0.44–1.00)
GFR, Estimated: >60 mL/min (ref >60)
Glucose, Bld: 79 mg/dL (ref 70–99)
Potassium: 3.7 mmol/L (ref 3.5–5.1)
Sodium: 141 mmol/L (ref 135–145)
Total Bilirubin: 1 mg/dL (ref 0.3–1.2)
Total Protein: 7.9 g/dL (ref 6.5–8.1)

## 2021-10-17 MED ORDER — GABAPENTIN 300 MG PO CAPS: 300.0000 mg | ORAL_CAPSULE | ORAL | Status: AC

## 2021-10-17 MED ORDER — CHLORHEXIDINE GLUCONATE 0.12 % MT SOLN: 15.0000 mL | Freq: Once | OROMUCOSAL | Status: AC

## 2021-10-17 MED ORDER — LACTATED RINGERS IV SOLN: INTRAVENOUS | Status: DC

## 2021-10-17 MED ORDER — FAMOTIDINE 20 MG PO TABS: 20.0000 mg | ORAL_TABLET | Freq: Once | ORAL | Status: AC

## 2021-10-17 MED ORDER — CHLORHEXIDINE GLUCONATE CLOTH 2 % EX PADS
6.0000 | MEDICATED_PAD | Freq: Once | CUTANEOUS | Status: AC
  Administered 2021-10-18: 6 via TOPICAL

## 2021-10-17 MED ORDER — ORAL CARE MOUTH RINSE: 15.0000 mL | Freq: Once | OROMUCOSAL | Status: AC

## 2021-10-17 MED ORDER — CELECOXIB 200 MG PO CAPS: 200.0000 mg | ORAL_CAPSULE | ORAL | Status: AC

## 2021-10-17 MED ORDER — ACETAMINOPHEN 500 MG PO TABS: 1000.0000 mg | ORAL_TABLET | ORAL | Status: AC

## 2021-10-17 MED ORDER — CHLORHEXIDINE GLUCONATE CLOTH 2 % EX PADS: 6.0000 | MEDICATED_PAD | Freq: Once | CUTANEOUS | Status: DC

## 2021-10-17 MED ORDER — CEFAZOLIN SODIUM-DEXTROSE 2-4 GM/100ML-% IV SOLN
2.0000 g | INTRAVENOUS | Status: AC
  Administered 2021-10-18: 2 g via INTRAVENOUS

## 2021-10-18 ENCOUNTER — Encounter
Admission: RE | Disposition: A | Discharge status: Home / Self Care | Payer: Self-pay | Source: Ambulatory Visit | Attending: Surgery

## 2021-10-18 ENCOUNTER — Ambulatory Visit: Payer: PPO | Admitting: Anesthesiology

## 2021-10-18 ENCOUNTER — Ambulatory Visit
Admission: RE | Admit: 2021-10-18 | Discharge: 2021-10-18 | Disposition: A | Discharge status: Home / Self Care | Payer: PPO | Source: Ambulatory Visit | Attending: Surgery | Admitting: Surgery

## 2021-10-18 ENCOUNTER — Other Ambulatory Visit: Payer: Self-pay

## 2021-10-18 ENCOUNTER — Ambulatory Visit: Payer: PPO | Admitting: Urgent Care

## 2021-10-18 ENCOUNTER — Encounter: Payer: Self-pay | Admitting: Surgery

## 2021-10-18 DIAGNOSIS — N6091 Unspecified benign mammary dysplasia of right breast: Secondary | ICD-10-CM | POA: Diagnosis not present

## 2021-10-18 DIAGNOSIS — N6081 Other benign mammary dysplasias of right breast: Secondary | ICD-10-CM | POA: Insufficient documentation

## 2021-10-18 DIAGNOSIS — R928 Other abnormal and inconclusive findings on diagnostic imaging of breast: Secondary | ICD-10-CM | POA: Diagnosis not present

## 2021-10-18 DIAGNOSIS — I10 Essential (primary) hypertension: Secondary | ICD-10-CM | POA: Insufficient documentation

## 2021-10-18 HISTORY — PX: BREAST BIOPSY WITH RADIO FREQUENCY LOCALIZER: SHX6895

## 2021-10-18 SURGERY — BREAST BIOPSY WITH RADIO FREQUENCY LOCALIZER
Anesthesia: General | Laterality: Right

## 2021-10-18 MED ORDER — ACETAMINOPHEN 500 MG PO TABS
ORAL_TABLET | ORAL | Status: AC
  Filled 2021-10-18: qty 1

## 2021-10-18 MED ORDER — PROPOFOL 10 MG/ML IV BOLUS
INTRAVENOUS | Status: AC
  Filled 2021-10-18: qty 20

## 2021-10-18 MED ORDER — FAMOTIDINE 20 MG PO TABS
ORAL_TABLET | ORAL | Status: AC
  Administered 2021-10-18: 20 mg via ORAL
  Filled 2021-10-18: qty 1

## 2021-10-18 MED ORDER — PHENYLEPHRINE HCL (PRESSORS) 10 MG/ML IV SOLN
INTRAVENOUS | Status: AC
  Filled 2021-10-18: qty 1

## 2021-10-18 MED ORDER — CHLORHEXIDINE GLUCONATE 0.12 % MT SOLN
OROMUCOSAL | Status: AC
  Administered 2021-10-18: 15 mL via OROMUCOSAL
  Filled 2021-10-18: qty 15

## 2021-10-18 MED ORDER — CEFAZOLIN SODIUM-DEXTROSE 2-4 GM/100ML-% IV SOLN
INTRAVENOUS | Status: AC
  Filled 2021-10-18: qty 100

## 2021-10-18 MED ORDER — OXYCODONE HCL 5 MG PO TABS
5.0000 mg | ORAL_TABLET | Freq: Once | ORAL | Status: AC | PRN
  Administered 2021-10-18: 5 mg via ORAL

## 2021-10-18 MED ORDER — OXYCODONE HCL 5 MG/5ML PO SOLN: 5.0000 mg | Freq: Once | ORAL | Status: AC | PRN

## 2021-10-18 MED ORDER — IBUPROFEN 800 MG PO TABS: 800.0000 mg | ORAL_TABLET | Freq: Three times a day (TID) | ORAL | 0 refills | Status: DC | PRN

## 2021-10-18 MED ORDER — PHENYLEPHRINE 40 MCG/ML (10ML) SYRINGE FOR IV PUSH (FOR BLOOD PRESSURE SUPPORT)
PREFILLED_SYRINGE | INTRAVENOUS | Status: DC | PRN
  Administered 2021-10-18: 40 ug via INTRAVENOUS
  Administered 2021-10-18 (×2): 80 ug via INTRAVENOUS

## 2021-10-18 MED ORDER — FENTANYL CITRATE (PF) 100 MCG/2ML IJ SOLN
INTRAMUSCULAR | Status: AC
  Filled 2021-10-18: qty 2

## 2021-10-18 MED ORDER — OXYCODONE HCL 5 MG PO TABS
ORAL_TABLET | ORAL | Status: AC
  Filled 2021-10-18: qty 1

## 2021-10-18 MED ORDER — HYDROCODONE-ACETAMINOPHEN 5-325 MG PO TABS: 1.0000 | ORAL_TABLET | Freq: Four times a day (QID) | ORAL | 0 refills | Status: DC | PRN

## 2021-10-18 MED ORDER — ACETAMINOPHEN 500 MG PO TABS
ORAL_TABLET | ORAL | Status: AC
  Administered 2021-10-18: 1000 mg via ORAL
  Filled 2021-10-18: qty 1

## 2021-10-18 MED ORDER — PROPOFOL 10 MG/ML IV BOLUS
INTRAVENOUS | Status: DC | PRN
  Administered 2021-10-18: 150 mg via INTRAVENOUS

## 2021-10-18 MED ORDER — MIDAZOLAM HCL 2 MG/2ML IJ SOLN
INTRAMUSCULAR | Status: DC | PRN
  Administered 2021-10-18: 2 mg via INTRAVENOUS

## 2021-10-18 MED ORDER — ONDANSETRON HCL 4 MG/2ML IJ SOLN: 4.0000 mg | Freq: Once | INTRAMUSCULAR | Status: DC | PRN

## 2021-10-18 MED ORDER — FENTANYL CITRATE (PF) 100 MCG/2ML IJ SOLN: 25.0000 ug | INTRAMUSCULAR | Status: DC | PRN

## 2021-10-18 MED ORDER — DEXAMETHASONE SODIUM PHOSPHATE 10 MG/ML IJ SOLN
INTRAMUSCULAR | Status: DC | PRN
Start: 2021-10-18 — End: 2021-10-18
  Administered 2021-10-18: 10 mg via INTRAVENOUS

## 2021-10-18 MED ORDER — BUPIVACAINE-EPINEPHRINE (PF) 0.25% -1:200000 IJ SOLN
INTRAMUSCULAR | Status: AC
  Filled 2021-10-18: qty 30

## 2021-10-18 MED ORDER — BUPIVACAINE-EPINEPHRINE (PF) 0.25% -1:200000 IJ SOLN
INTRAMUSCULAR | Status: DC | PRN
  Administered 2021-10-18: 9 mL
  Administered 2021-10-18: 11 mL

## 2021-10-18 MED ORDER — FENTANYL CITRATE (PF) 100 MCG/2ML IJ SOLN
INTRAMUSCULAR | Status: DC | PRN
  Administered 2021-10-18 (×2): 25 ug via INTRAVENOUS

## 2021-10-18 MED ORDER — ONDANSETRON HCL 4 MG/2ML IJ SOLN
INTRAMUSCULAR | Status: DC | PRN
Start: 2021-10-18 — End: 2021-10-18
  Administered 2021-10-18: 4 mg via INTRAVENOUS

## 2021-10-18 MED ORDER — LIDOCAINE HCL (CARDIAC) PF 100 MG/5ML IV SOSY
PREFILLED_SYRINGE | INTRAVENOUS | Status: DC | PRN
Start: 2021-10-18 — End: 2021-10-18
  Administered 2021-10-18: 50 mg via INTRAVENOUS

## 2021-10-18 MED ORDER — BUPIVACAINE LIPOSOME 1.3 % IJ SUSP
INTRAMUSCULAR | Status: AC
  Filled 2021-10-18: qty 20

## 2021-10-18 MED ORDER — MIDAZOLAM HCL 2 MG/2ML IJ SOLN
INTRAMUSCULAR | Status: AC
  Filled 2021-10-18: qty 2

## 2021-10-18 MED ORDER — STERILE WATER FOR IRRIGATION IR SOLN
Status: DC | PRN
  Administered 2021-10-18: 1000 mL

## 2021-10-18 MED ORDER — GABAPENTIN 300 MG PO CAPS
ORAL_CAPSULE | ORAL | Status: AC
  Administered 2021-10-18: 300 mg via ORAL
  Filled 2021-10-18: qty 1

## 2021-10-18 MED ORDER — CELECOXIB 200 MG PO CAPS
ORAL_CAPSULE | ORAL | Status: AC
  Administered 2021-10-18: 200 mg via ORAL
  Filled 2021-10-18: qty 1

## 2021-10-18 SURGICAL SUPPLY — 44 items
ADH SKN CLS APL DERMABOND .7 (GAUZE/BANDAGES/DRESSINGS) ×1
APL PRP STRL LF DISP 70% ISPRP (MISCELLANEOUS) ×1
APPLIER CLIP 9.375 SM OPEN (CLIP)
APR CLP SM 9.3 20 MLT OPN (CLIP)
BINDER BREAST XLRG (GAUZE/BANDAGES/DRESSINGS) ×1 IMPLANT
BLADE SURG 15 STRL LF DISP TIS (BLADE) ×1 IMPLANT
BLADE SURG 15 STRL SS (BLADE) ×2
CHLORAPREP W/TINT 26 (MISCELLANEOUS) ×2 IMPLANT
CLIP APPLIE 9.375 SM OPEN (CLIP) IMPLANT
CNTNR SPEC 2.5X3XGRAD LEK (MISCELLANEOUS)
CONT SPEC 4OZ STER OR WHT (MISCELLANEOUS)
CONT SPEC 4OZ STRL OR WHT (MISCELLANEOUS)
CONTAINER SPEC 2.5X3XGRAD LEK (MISCELLANEOUS) IMPLANT
DERMABOND ADVANCED (GAUZE/BANDAGES/DRESSINGS) ×1
DERMABOND ADVANCED .7 DNX12 (GAUZE/BANDAGES/DRESSINGS) ×1 IMPLANT
DEVICE DUBIN SPECIMEN MAMMOGRA (MISCELLANEOUS) ×2 IMPLANT
DRAPE LAPAROTOMY TRNSV 106X77 (MISCELLANEOUS) ×2 IMPLANT
ELECT CAUTERY BLADE TIP 2.5 (TIP) ×2
ELECT REM PT RETURN 9FT ADLT (ELECTROSURGICAL) ×2
ELECTRODE CAUTERY BLDE TIP 2.5 (TIP) ×1 IMPLANT
ELECTRODE REM PT RTRN 9FT ADLT (ELECTROSURGICAL) ×1 IMPLANT
GAUZE 4X4 16PLY ~~LOC~~+RFID DBL (SPONGE) ×2 IMPLANT
GLOVE SURG ORTHO LTX SZ7.5 (GLOVE) ×2 IMPLANT
GOWN STRL REUS W/ TWL LRG LVL3 (GOWN DISPOSABLE) ×1 IMPLANT
GOWN STRL REUS W/ TWL XL LVL3 (GOWN DISPOSABLE) ×1 IMPLANT
GOWN STRL REUS W/TWL LRG LVL3 (GOWN DISPOSABLE) ×2
GOWN STRL REUS W/TWL XL LVL3 (GOWN DISPOSABLE) ×2
KIT MARKER MARGIN INK (KITS) IMPLANT
KIT TURNOVER KIT A (KITS) ×2 IMPLANT
MANIFOLD NEPTUNE II (INSTRUMENTS) ×2 IMPLANT
NEEDLE HYPO 22GX1.5 SAFETY (NEEDLE) ×2 IMPLANT
PACK BASIN MINOR ARMC (MISCELLANEOUS) ×2 IMPLANT
SET LOCALIZER 20 PROBE US (MISCELLANEOUS) ×2 IMPLANT
SUT ETHILON 3-0 FS-10 30 BLK (SUTURE) ×2
SUT MNCRL 4-0 (SUTURE) ×2
SUT MNCRL 4-0 27XMFL (SUTURE) ×1
SUT VIC AB 3-0 SH 27 (SUTURE) ×2
SUT VIC AB 3-0 SH 27X BRD (SUTURE) ×1 IMPLANT
SUTURE EHLN 3-0 FS-10 30 BLK (SUTURE) IMPLANT
SUTURE MNCRL 4-0 27XMF (SUTURE) ×1 IMPLANT
SYR 20ML LL LF (SYRINGE) ×2 IMPLANT
TRAP NEPTUNE SPECIMEN COLLECT (MISCELLANEOUS) ×2 IMPLANT
WATER STERILE IRR 1000ML POUR (IV SOLUTION) ×2 IMPLANT
WATER STERILE IRR 500ML POUR (IV SOLUTION) ×2 IMPLANT

## 2021-10-18 NOTE — Op Note (Signed)
?  Pre-operative Diagnosis: Atypical ductal hyperplasia right breast.   ? ?Post-operative Diagnosis: Same ? ?Surgeon: Ronny Bacon, M.D., FACS ? ?Anesthesia: General ? ?Procedure: Right lateral excisional biopsy, RFID tag directed. ? ?Procedure Details  ?The patient was seen again in the Holding Room. The benefits, complications, treatment options, and expected outcomes were discussed with the patient. The risks of bleeding, infection, recurrence of symptoms, failure to resolve symptoms, hematoma, seroma, open wound, cosmetic deformity, and the need for further surgery were discussed.  The patient was taken to Operating Room, identified as Abigail Malone and the procedure verified.  A Time Out was held and the above information confirmed. ? ?Prior to the induction of general anesthesia, antibiotic prophylaxis was administered. VTE prophylaxis was in place. The patient was positioned in the supine position. Appropriate anesthesia was then administered and tolerated well. The LOCALizer is used to mark the skin for incision.  ? ?Attention was turned to the RFID tag localization site where an incision was made. Dissection using the LOCALizer to perform a lumpectomy with adequate margins was performed. This was done with electrocautery and sharp dissection with Mayo scissors.  At one point I thought I was sufficiently deep, and began to transect across the base, only to identify the marker, I then utilized Babcock forceps to grasp additional deep tissue and redefine the posterior/deep margin.  There was minimal bleeding, and the cavity packed.  The specimen was taken to the back table reconstructed with suture to close the transection portion and reapproximate the potential margin.   I returned to the cavity to remove the packing, and hemostasis was confirmed with electrocautery.   Once assuring that hemostasis was adequate and checked multiple times the wound was closed with interrupted 3-0 Vicryl followed by 4-0  subcuticular Monocryl sutures. ? ?Dermabond is utilized to seal the incision.  ?Injection of quarter percent Marcaine with epinephrine was utilized initially for the skin and subsequently for the biopsy cavity. ? ?Findings: Faxitron imaging: Confirmed the presence of both markers well within the boundaries of the specimen. ? ?Estimated Blood Loss: Minimal ?        ?Drains: None ?        ?Specimens: Lateral right breast tissue ?      ?Complications: None. ?        ?Condition: Stable ? ?Ronny Bacon, M.D., FACS ?[facility] ? ?10/18/2021 ; 9:00 AM ? ? ? ?  ?

## 2021-10-18 NOTE — Progress Notes (Signed)
?   10/18/21 0700  ?Clinical Encounter Type  ?Visited With Patient  ?Visit Type Initial;Pre-op  ?Spiritual Encounters  ?Spiritual Needs Prayer  ? ?Chaplain provided pre-op support. ?

## 2021-10-18 NOTE — Transfer of Care (Signed)
Immediate Anesthesia Transfer of Care Note ? ?Patient: Abigail Malone ? ?Procedure(s) Performed: BREAST BIOPSY WITH RADIO FREQUENCY LOCALIZER (Right) ? ?Patient Location: PACU ? ?Anesthesia Type:General ? ?Level of Consciousness: drowsy ? ?Airway & Oxygen Therapy: Patient Spontanous Breathing ? ?Post-op Assessment: Report given to RN and Post -op Vital signs reviewed and stable ? ?Post vital signs: Reviewed and stable ? ?Last Vitals:  ?Vitals Value Taken Time  ?BP 166/89 10/18/21 0841  ?Temp    ?Pulse 72 10/18/21 0844  ?Resp 13 10/18/21 0844  ?SpO2 100 % 10/18/21 0844  ?Vitals shown include unvalidated device data. ? ?Last Pain:  ?Vitals:  ? 10/18/21 0627  ?TempSrc: Temporal  ?PainSc: 0-No pain  ?   ? ?  ? ?Complications: No notable events documented. ?

## 2021-10-18 NOTE — Interval H&P Note (Signed)
History and Physical Interval Note: ? ?10/18/2021 ?7:24 AM ? ?Abigail Malone  has presented today for surgery, with the diagnosis of ADH right breast.  The various methods of treatment have been discussed with the patient and family. After consideration of risks, benefits and other options for treatment, the patient has consented to  Procedure(s): ?BREAST BIOPSY WITH RADIO FREQUENCY LOCALIZER (Right) as a surgical intervention.  The patient's history has been reviewed, patient examined, no change in status, stable for surgery.  I have reviewed the patient's chart and labs.  Questions were answered to the patient's satisfaction.   ?The right side is marked.  ? ?Campbell Lerner ? ? ?

## 2021-10-18 NOTE — Discharge Instructions (Signed)

## 2021-10-18 NOTE — Anesthesia Procedure Notes (Signed)
Procedure Name: LMA Insertion ?Date/Time: 10/18/2021 7:42 AM ?Performed by: Biagio Borg, CRNA ?Pre-anesthesia Checklist: Patient identified, Emergency Drugs available, Suction available and Patient being monitored ?Patient Re-evaluated:Patient Re-evaluated prior to induction ?Oxygen Delivery Method: Circle system utilized ?Preoxygenation: Pre-oxygenation with 100% oxygen ?Induction Type: IV induction ?Ventilation: Mask ventilation without difficulty ?LMA: LMA inserted ?LMA Size: 4.0 ?Tube type: Oral ?Number of attempts: 1 ?Placement Confirmation: positive ETCO2 and breath sounds checked- equal and bilateral ?Tube secured with: Tape ?Dental Injury: Teeth and Oropharynx as per pre-operative assessment  ? ? ? ? ?

## 2021-10-18 NOTE — Anesthesia Preprocedure Evaluation (Signed)
Anesthesia Evaluation  ?Patient identified by MRN, date of birth, ID band ?Patient awake ? ? ? ?Reviewed: ?Allergy & Precautions, NPO status , Patient's Chart, lab work & pertinent test results ? ?History of Anesthesia Complications ?Negative for: history of anesthetic complications ? ?Airway ?Mallampati: III ? ?TM Distance: >3 FB ?Neck ROM: Full ? ? ? Dental ?no notable dental hx. ?(+) Teeth Intact ?  ?Pulmonary ?neg pulmonary ROS, neg sleep apnea, neg COPD, Patient abstained from smoking.Not current smoker, former smoker,  ?  ?Pulmonary exam normal ?breath sounds clear to auscultation ? ? ? ? ? ? Cardiovascular ?Exercise Tolerance: Good ?METShypertension, Pt. on medications ?(-) CAD and (-) Past MI (-) dysrhythmias  ?Rhythm:Regular Rate:Normal ?- Systolic murmurs ? ?  ?Neuro/Psych ?negative neurological ROS ? negative psych ROS  ? GI/Hepatic ?neg GERD  ,(+)  ?  ? (-) substance abuse ? ,   ?Endo/Other  ?neg diabetes ? Renal/GU ?negative Renal ROS  ? ?  ?Musculoskeletal ? ? Abdominal ?  ?Peds ? Hematology ?  ?Anesthesia Other Findings ?Past Medical History: ?05/15/2016: Abnormal uterine bleeding (AUB) ?12/09/2014: Allergic rhinitis, seasonal ?11/18/2013: Anemia, unspecified ?11/18/2013: HTN (hypertension) ?No date: Hypertension ?08/05/2014: Menopausal symptoms ?07/26/2015: Right knee pain ? Reproductive/Obstetrics ? ?  ? ? ? ? ? ? ? ? ? ? ? ? ? ?  ?  ? ? ? ? ? ? ? ? ?Anesthesia Physical ?Anesthesia Plan ? ?ASA: 2 ? ?Anesthesia Plan: General  ? ?Post-op Pain Management: Tylenol PO (pre-op)*, Gabapentin PO (pre-op)* and Celebrex PO (pre-op)*  ? ?Induction: Intravenous ? ?PONV Risk Score and Plan: 3 and Ondansetron, Dexamethasone and Treatment may vary due to age or medical condition ? ?Airway Management Planned: LMA ? ?Additional Equipment: None ? ?Intra-op Plan:  ? ?Post-operative Plan: Extubation in OR ? ?Informed Consent: I have reviewed the patients History and Physical, chart, labs and  discussed the procedure including the risks, benefits and alternatives for the proposed anesthesia with the patient or authorized representative who has indicated his/her understanding and acceptance.  ? ? ? ?Dental advisory given ? ?Plan Discussed with: CRNA and Surgeon ? ?Anesthesia Plan Comments: (Discussed risks of anesthesia with patient, including PONV, sore throat, lip/dental/eye damage, post operative cognitive dysfunction. Rare risks discussed as well, such as cardiorespiratory and neurological sequelae, and allergic reactions. Discussed the role of CRNA in patient's perioperative care. Patient understands.)  ? ? ? ? ? ? ?Anesthesia Quick Evaluation ? ?

## 2021-10-19 LAB — SURGICAL PATHOLOGY

## 2021-10-20 ENCOUNTER — Ambulatory Visit: Payer: Self-pay

## 2021-10-20 ENCOUNTER — Ambulatory Visit (INDEPENDENT_AMBULATORY_CARE_PROVIDER_SITE_OTHER): Payer: PPO | Admitting: Family Medicine

## 2021-10-20 ENCOUNTER — Encounter: Payer: Self-pay | Admitting: Family Medicine

## 2021-10-20 VITALS — BP 138/90 | HR 78 | Temp 97.6°F | Ht 59.5 in | Wt 168.0 lb

## 2021-10-20 DIAGNOSIS — B3731 Acute candidiasis of vulva and vagina: Secondary | ICD-10-CM | POA: Diagnosis not present

## 2021-10-20 DIAGNOSIS — N342 Other urethritis: Secondary | ICD-10-CM

## 2021-10-20 LAB — POCT URINALYSIS DIPSTICK
Bilirubin, UA: NEGATIVE
Blood, UA: NEGATIVE
Glucose, UA: NEGATIVE
Ketones, UA: NEGATIVE
Leukocytes, UA: NEGATIVE
Nitrite, UA: NEGATIVE
Protein, UA: NEGATIVE
Spec Grav, UA: 1.015 (ref 1.010–1.025)
Urobilinogen, UA: 0.2 E.U./dL
pH, UA: 6 (ref 5.0–8.0)

## 2021-10-20 MED ORDER — FLUCONAZOLE 150 MG PO TABS: 150.0000 mg | ORAL_TABLET | Freq: Once | ORAL | 0 refills | Status: AC

## 2021-10-20 MED ORDER — NYSTATIN 100000 UNIT/GM EX CREA: 1.0000 | TOPICAL_CREAM | Freq: Two times a day (BID) | CUTANEOUS | 0 refills | Status: DC

## 2021-10-20 NOTE — Progress Notes (Signed)
[facility] ? ? ?Date:  10/20/2021  ? ?Name:  Abigail Malone   DOB:  08/09/1950   MRN:  [id number] ? ? ?Chief Complaint: Urinary Tract Infection (Burning at end of stream and some itching) ? ?Urinary Tract Infection  ?This is a new problem. The current episode started yesterday (wednesday). The problem occurs every urination. The quality of the pain is described as burning. There has been no fever. Pertinent negatives include no chills, discharge, flank pain, frequency, hematuria or urgency. She has tried nothing (recently off antibiotics s/p surgery) for the symptoms. post operative  ? ?Lab Results  ?Component Value Date  ? NA 141 10/12/2021  ? K 3.7 10/12/2021  ? CO2 26 10/12/2021  ? GLUCOSE 79 10/12/2021  ? BUN 10 10/12/2021  ? CREATININE 0.56 10/12/2021  ? CALCIUM 9.4 10/12/2021  ? EGFR 94 05/16/2021  ? GFRNONAA >60 10/12/2021  ? ?Lab Results  ?Component Value Date  ? CHOL 223 (H) 05/16/2021  ? HDL 56 05/16/2021  ? LDLCALC 142 (H) 05/16/2021  ? TRIG 140 05/16/2021  ? ?No results found for: TSH ?No results found for: HGBA1C ?Lab Results  ?Component Value Date  ? WBC 7.6 10/12/2021  ? HGB 11.3 (L) 10/12/2021  ? HCT 34.9 (L) 10/12/2021  ? MCV 80.4 10/12/2021  ? PLT 257 10/12/2021  ? ?Lab Results  ?Component Value Date  ? ALT 12 10/12/2021  ? AST 18 10/12/2021  ? ALKPHOS 71 10/12/2021  ? BILITOT 1.0 10/12/2021  ? ?No results found for: 25OHVITD2, 25OHVITD3, VD25OH  ? ?Review of Systems  ?Constitutional:  Negative for chills.  ?Genitourinary:  Negative for difficulty urinating, flank pain, frequency, hematuria and urgency.  ?     Vaginal itching  ? ?Patient Active Problem List  ? Diagnosis Date Noted  ? Atypical ductal hyperplasia of right breast 09/19/2021  ? Obesity with body mass index (BMI) of 30.0 to 39.9 01/01/2020  ? Cardiac syncope 05/18/2019  ? Hyperlipidemia, mixed 05/18/2019  ? Mixed stress and urge urinary incontinence 04/01/2018  ? Abnormal uterine bleeding (AUB) 05/15/2016  ? Right knee pain 07/26/2015  ?  Allergic rhinitis, seasonal 12/09/2014  ? Menopausal symptoms 08/05/2014  ? HTN (hypertension) 11/18/2013  ? Anemia, unspecified 11/18/2013  ? ? ?Allergies  ?Allergen Reactions  ? Amlodipine   ?  Leg cramping  ? ? ?Past Surgical History:  ?Procedure Laterality Date  ? BREAST BIOPSY Right 09/07/2021  ? stereo calcs, ribbon clip, path pending.  ? BREAST BIOPSY WITH RADIO FREQUENCY LOCALIZER Right 10/18/2021  ? Procedure: BREAST BIOPSY WITH RADIO FREQUENCY LOCALIZER;  Surgeon: Rodenberg, Denny, MD;  Location: [facility] ORS;  Service: General;  Laterality: Right;  ? COLONOSCOPY  ~2018  ? ? ?Social History  ? ?Tobacco Use  ? Smoking status: Former  ?  Packs/day: 0.50  ?  Years: 8.00  ?  Pack years: 4.00  ?  Types: Cigarettes  ?  Quit date: 12/22/2006  ?  Years since quitting: 14.8  ? Smokeless tobacco: Never  ?Vaping Use  ? Vaping Use: Never used  ?Substance Use Topics  ? Alcohol use: Not Currently  ? Drug use: No  ? ? ? ?Medication list has been reviewed and updated. ? ?Current Meds  ?Medication Sig  ? acetaminophen (TYLENOL) 500 MG tablet Take 1,000 mg by mouth every 6 (six) hours as needed for moderate pain.  ? Cholecalciferol (VITAMIN D) 50 MCG (2000 UT) tablet Take 2,000 Units by mouth daily.  ? Coenzyme Q10 200 MG capsule   Take 200 mg by mouth daily.  ? conjugated estrogens (PREMARIN) vaginal cream Estrogen Cream Instruction Discard applicator Apply pea sized amount to tip of finger to urethra before bed. Wash hands well after application. Use Monday, Wednesday and Friday  ? HYDROcodone-acetaminophen (NORCO/VICODIN) 5-325 MG tablet Take 1 tablet by mouth every 6 (six) hours as needed for moderate pain.  ? ibuprofen (ADVIL) 800 MG tablet Take 1 tablet (800 mg total) by mouth every 8 (eight) hours as needed.  ? montelukast (SINGULAIR) 10 MG tablet Take 1 tablet (10 mg total) by mouth at bedtime.  ? Omega-3 Fatty Acids (FISH OIL) 1200 MG CAPS Take 1,200 mg by mouth daily.  ? oxybutynin (DITROPAN-XL) 10 MG 24 hr tablet Take 10  mg by mouth daily.  ? ? ? ?  06/05/2021  ? 11:21 AM 04/03/2021  ?  1:52 PM  ?GAD 7 : Generalized Anxiety Score  ?Nervous, Anxious, on Edge 0 0  ?Control/stop worrying 0 0  ?Worry too much - different things 0 0  ?Trouble relaxing 0 0  ?Restless 0 0  ?Easily annoyed or irritable 0 0  ?Afraid - awful might happen 0 0  ?Total GAD 7 Score 0 0  ?Anxiety Difficulty Not difficult at all   ? ? ? ?  06/05/2021  ? 11:21 AM  ?Depression screen PHQ 2/9  ?Decreased Interest 0  ?Down, Depressed, Hopeless 0  ?PHQ - 2 Score 0  ?Altered sleeping 0  ?Tired, decreased energy 2  ?Change in appetite 2  ?Feeling bad or failure about yourself  0  ?Trouble concentrating 0  ?Moving slowly or fidgety/restless 0  ?Suicidal thoughts 0  ?PHQ-9 Score 4  ?Difficult doing work/chores Not difficult at all  ? ? ?BP Readings from Last 3 Encounters:  ?10/20/21 138/90  ?10/18/21 (!) 203/70  ?09/21/21 130/80  ? ? ?Physical Exam ?Vitals and nursing note reviewed. Exam conducted with a chaperone present.  ?Constitutional:   ?   General: She is not in acute distress. ?   Appearance: She is not diaphoretic.  ?HENT:  ?   Head: Normocephalic and atraumatic.  ?   Right Ear: Tympanic membrane, ear canal and external ear normal.  ?   Left Ear: Tympanic membrane, ear canal and external ear normal.  ?   Nose: Nose normal. No congestion or rhinorrhea.  ?Eyes:  ?   General:     ?   Right eye: No discharge.     ?   Left eye: No discharge.  ?   Conjunctiva/sclera: Conjunctivae normal.  ?   Pupils: Pupils are equal, round, and reactive to light.  ?Neck:  ?   Thyroid: No thyromegaly.  ?   Vascular: No JVD.  ?Cardiovascular:  ?   Rate and Rhythm: Normal rate and regular rhythm.  ?   Heart sounds: Normal heart sounds. No murmur heard. ?  No friction rub. No gallop.  ?Pulmonary:  ?   Effort: Pulmonary effort is normal.  ?   Breath sounds: Normal breath sounds.  ?Abdominal:  ?   General: Bowel sounds are normal.  ?   Palpations: Abdomen is soft. There is no mass.  ?    Tenderness: There is no abdominal tenderness. There is no right CVA tenderness, left CVA tenderness or guarding.  ?Musculoskeletal:     ?   General: Normal range of motion.  ?   Cervical back: Normal range of motion and neck supple.  ?Lymphadenopathy:  ?   Cervical: No cervical   adenopathy.  ?Skin: ?   General: Skin is warm and dry.  ?Neurological:  ?   Mental Status: She is alert.  ?   Deep Tendon Reflexes: Reflexes are normal and symmetric.  ? ? ?Wt Readings from Last 3 Encounters:  ?10/20/21 168 lb (76.2 kg)  ?10/18/21 168 lb (76.2 kg)  ?10/11/21 170 lb (77.1 kg)  ? ? ?BP 138/90   Pulse 78   Temp 97.6 ?F (36.4 ?C) (Oral)   Ht 4' 11.5" (1.511 m)   Wt 168 lb (76.2 kg)   SpO2 99%   BMI 33.36 kg/m?  ? ?Assessment and Plan: ? ?1. Urethritis ?New onset.  Began yesterday patient just stopped antibiotics a day before on Wednesday.  Patient recently had breast biopsy and was in prophylaxis antibiotic therapy.  Onset of localized pruritus and mild dysuria.  Urinalysis was unremarkable for infection of a bacterial nature. ?- fluconazole (DIFLUCAN) 150 MG tablet; Take 1 tablet (150 mg total) by mouth once for 1 dose.  Dispense: 1 tablet; Refill: 0 ?- nystatin cream (MYCOSTATIN); Apply 1 application. topically 2 (two) times daily.  Dispense: 30 g; Refill: 0 ?- POCT urinalysis dipstick ? ?2. Candidiasis of vulva ?Given the recent antibiotic probably selected out a yeast infection and we will treat with fluconazole 150 mg 1 dose and nystatin cream pea-sized to be applied in the bulbar/periurethral area. ?- fluconazole (DIFLUCAN) 150 MG tablet; Take 1 tablet (150 mg total) by mouth once for 1 dose.  Dispense: 1 tablet; Refill: 0 ?- nystatin cream (MYCOSTATIN); Apply 1 application. topically 2 (two) times daily.  Dispense: 30 g; Refill: 0  ? ? ?

## 2021-10-20 NOTE — Telephone Encounter (Signed)
Patient called in with burning and itching in vaginal area, thinks it could be UTI. She didn't want an appt. She is asking for med to be sent to [place], [place] [address] AT [place]  ?Phone: [phone]  ?Fax: [phone]  ? ? ?Chief Complaint: Vaginal itching, burning. Just finished antibiotics from recent surgery. ?Symptoms: Above ?Frequency: Started yesterday. ?Pertinent Negatives: Patient denies any other symptoms. ?Disposition: [] ED /[] Urgent Care (no appt availability in office) / [] Appointment(In office/virtual)/ []  [facility]/ [] Home Care/ [] Refused Recommended Disposition /[] [facility]/ []  Follow-up with PCP ?Additional Notes: Declines virtual or in office visit. Cannot drive because of recent surgery and doe not have a smart phone. Requesting medication be sent to pharmacy.Please advise pt.  ?Answer Assessment - Initial Assessment Questions ?1. SYMPTOM: "What's the main symptom you're concerned about?" (e.g., pain, itching, dryness) ?    Vaginal burning and itching ?2. LOCATION: "Where is the  symptoms located?" (e.g., inside/outside, left/right) ?    Outside ?3. ONSET: "When did the  symptoms  start?" ?    Yesterday ?4. PAIN: "Is there any pain?" If Yes, ask: "How bad is it?" (Scale: 1-10; mild, moderate, severe) ?    No ?5. ITCHING: "Is there any itching?" If Yes, ask: "How bad is it?" (Scale: 1-10; mild, moderate, severe) ?    Moderate ?6. CAUSE: "What do you think is causing the discharge?" "Have you had the same problem before? What happened then?" ?    Unsure ?7. OTHER SYMPTOMS: "Do you have any other symptoms?" (e.g., fever, itching, vaginal bleeding, pain with urination, injury to genital area, vaginal foreign body) ?    Itching ?8. PREGNANCY: "Is there any chance you are pregnant?" "When was your last menstrual period?" ?    No ? ?Protocols used: Vaginal Symptoms-A-AH ? ?

## 2021-10-23 NOTE — Anesthesia Postprocedure Evaluation (Signed)
Anesthesia Post Note ? ?Patient: Abigail Malone ? ?Procedure(s) Performed: BREAST BIOPSY WITH RADIO FREQUENCY LOCALIZER (Right) ? ?Patient location during evaluation: PACU ?Anesthesia Type: General ?Level of consciousness: awake and alert ?Pain management: pain level controlled ?Vital Signs Assessment: post-procedure vital signs reviewed and stable ?Respiratory status: spontaneous breathing, nonlabored ventilation, respiratory function stable and patient connected to nasal cannula oxygen ?Cardiovascular status: blood pressure returned to baseline and stable ?Postop Assessment: no apparent nausea or vomiting ?Anesthetic complications: no ? ? ?No notable events documented. ? ? ?Last Vitals:  ?Vitals:  ? 10/18/21 0932 10/18/21 0935  ?BP:  (!) 203/70  ?Pulse: 63   ?Resp: 15   ?Temp: (!) 36.1 ?C   ?SpO2: 100%   ?  ?Last Pain:  ?Vitals:  ? 10/18/21 0932  ?TempSrc: Temporal  ?PainSc: 0-No pain  ? ? ?  ?  ?  ?  ?  ?  ? ?Molli Barrows ? ? ? ? ?

## 2021-11-02 ENCOUNTER — Ambulatory Visit (INDEPENDENT_AMBULATORY_CARE_PROVIDER_SITE_OTHER): Payer: PPO | Admitting: Surgery

## 2021-11-02 ENCOUNTER — Encounter: Payer: Self-pay | Admitting: Surgery

## 2021-11-02 VITALS — BP 190/75 | HR 76 | Temp 98.2°F | Ht 59.5 in | Wt 173.0 lb

## 2021-11-02 DIAGNOSIS — N6091 Unspecified benign mammary dysplasia of right breast: Secondary | ICD-10-CM

## 2021-11-02 NOTE — Progress Notes (Signed)
[facility] ?POST-OP OFFICE VISIT ? ?11/02/2021 ? ?HPI: ?Abigail Malone is a 71 y.o. female 15 days s/p right RF ID tag lumpectomy for atypical ductal hyperplasia. ?She appreciates the utilization of breast support, feeling somewhat tender when not utilizing it.  She denies fevers or chills.  Reports the area feels a little warm.  Denies any drainage, or pain. ? ?Vital signs: ?BP (!) 190/75   Pulse 76   Temp 98.2 ?F (36.8 ?C)   Ht 4' 11.5" (1.511 m)   Wt 173 lb (78.5 kg)   SpO2 98%   BMI 34.36 kg/m?   ? ?Physical Exam: ?Constitutional: Appears well ?Right breast without ecchymotic changes or mass effect.  Expected usual warmth.  No erythema. ?Skin: Incision is clean, dry and intact. ? ?Pathology report reviewed. ?DIAGNOSIS:  ?A. BREAST, RIGHT, WIDE EXCISION:  ?- FOCAL RESIDUAL ATYPICAL DUCTAL HYPERPLASIA ASSOCIATED WITH PREVIOUS  ?BIOPSY SITE.  ?- BIOPSY CLIP IDENTIFIED.  ?- FIBROCYSTIC CHANGES, BACKGROUND COLUMNAR CELL CHANGE WITH APOCRINE  ?METAPLASIA.  ?- MICROCALCIFICATIONS PRESENT.  ?- NEGATIVE FOR DUCTAL CARCINOMA IN SITU AND INVASIVE CARCINOMA.  ?- MARGINS NEGATIVE FOR ATYPICAL DUCTAL HYPERPLASIA.  ? ?Assessment/Plan: ?This is a 71 y.o. female 15 days s/p right breast excisional biopsy for ADH. ? ?Patient Active Problem List  ? Diagnosis Date Noted  ? Atypical ductal hyperplasia of right breast 09/19/2021  ? Obesity with body mass index (BMI) of 30.0 to 39.9 01/01/2020  ? Cardiac syncope 05/18/2019  ? Hyperlipidemia, mixed 05/18/2019  ? Mixed stress and urge urinary incontinence 04/01/2018  ? Abnormal uterine bleeding (AUB) 05/15/2016  ? Right knee pain 07/26/2015  ? Allergic rhinitis, seasonal 12/09/2014  ? Menopausal symptoms 08/05/2014  ? HTN (hypertension) 11/18/2013  ? Anemia, unspecified 11/18/2013  ? ? - Follow up with next set of screening imaging, or as needed.  ?Referral to oncology for risk reduction, consultation.  ? ? ? ?Ronny Bacon M.D., FACS ?11/02/2021, 9:25 AM  ?

## 2021-11-02 NOTE — Patient Instructions (Addendum)
We have referred you to Oncology for them to speak with you about taking antihormonal therapy. They will call you to schedule this appointment.  ? ?Continue to wear the binder for a few more weeks.  ? ?We will have you follow up here in October with a mammogram of the affected breast and follow up in the office. ?We will send you a letter about these appointments.  ? ? ? ? ? ? ?

## 2021-11-06 ENCOUNTER — Other Ambulatory Visit: Payer: Self-pay | Admitting: Surgery

## 2021-11-10 ENCOUNTER — Inpatient Hospital Stay: Payer: PPO

## 2021-11-10 ENCOUNTER — Encounter: Payer: Self-pay | Admitting: Internal Medicine

## 2021-11-10 ENCOUNTER — Inpatient Hospital Stay: Payer: PPO | Attending: Internal Medicine | Admitting: Internal Medicine

## 2021-11-10 DIAGNOSIS — Z832 Family history of diseases of the blood and blood-forming organs and certain disorders involving the immune mechanism: Secondary | ICD-10-CM | POA: Diagnosis not present

## 2021-11-10 DIAGNOSIS — Z888 Allergy status to other drugs, medicaments and biological substances status: Secondary | ICD-10-CM | POA: Diagnosis not present

## 2021-11-10 DIAGNOSIS — Z87891 Personal history of nicotine dependence: Secondary | ICD-10-CM | POA: Insufficient documentation

## 2021-11-10 DIAGNOSIS — Z1501 Genetic susceptibility to malignant neoplasm of breast: Secondary | ICD-10-CM | POA: Insufficient documentation

## 2021-11-10 DIAGNOSIS — Z800 Family history of malignant neoplasm of digestive organs: Secondary | ICD-10-CM | POA: Diagnosis not present

## 2021-11-10 DIAGNOSIS — Z841 Family history of disorders of kidney and ureter: Secondary | ICD-10-CM | POA: Insufficient documentation

## 2021-11-10 DIAGNOSIS — Z79899 Other long term (current) drug therapy: Secondary | ICD-10-CM | POA: Diagnosis not present

## 2021-11-10 DIAGNOSIS — R030 Elevated blood-pressure reading, without diagnosis of hypertension: Secondary | ICD-10-CM | POA: Insufficient documentation

## 2021-11-10 DIAGNOSIS — Z823 Family history of stroke: Secondary | ICD-10-CM | POA: Diagnosis not present

## 2021-11-10 DIAGNOSIS — Z8042 Family history of malignant neoplasm of prostate: Secondary | ICD-10-CM | POA: Diagnosis not present

## 2021-11-10 DIAGNOSIS — N6091 Unspecified benign mammary dysplasia of right breast: Secondary | ICD-10-CM | POA: Insufficient documentation

## 2021-11-10 DIAGNOSIS — N3281 Overactive bladder: Secondary | ICD-10-CM | POA: Insufficient documentation

## 2021-11-10 DIAGNOSIS — Z8249 Family history of ischemic heart disease and other diseases of the circulatory system: Secondary | ICD-10-CM | POA: Insufficient documentation

## 2021-11-10 DIAGNOSIS — I10 Essential (primary) hypertension: Secondary | ICD-10-CM | POA: Diagnosis not present

## 2021-11-10 NOTE — Progress Notes (Signed)
C/o yeast infections with antibiotics. ?

## 2021-11-10 NOTE — Assessment & Plan Note (Addendum)
#  Atypical ductal hyperplasia - using IBIS calculator patient's risk calculated over 10 years is anywhere between- 9-10%  where as average risk women is about 5%.  Using endocrine therapy-the risk is almost cut down by 50%.  However, using any therapeutic intervention has not shown to have improved breast cancer specific survival or overall survival. ? ?# Discussed the role of tamoxifen in risk reduction of development of breast cancer.  I reviewed the data of using tamoxifen 20 mg a day for 5 years.  Long discussion regarding the potential adverse events on tamoxifen including but not limited to hot flashes, mood swings, thromboembolic events strokes and also small risk of uterine cancers.  After lengthy discussion with patient feels the risk of the treatment overweigh the benefits.  And declines any endocrine therapy; which I think is very reasonable.  ? ?#IV had a long discussion with the patient regarding incorporation of healthy lifestyle which includes #moderation of alcohol #abstinence of smoking #maintaining healthy BMI#exercise 150-300 minutes of moderate intensity exercise/week.  ? ?# ELEVATED BP: Initially blood pressure systolic 200; however repeat 150s.  Patient currently not on any antihypertensive.  Patient recommended follow-up with PCP ? ?# Overactive bladder: [on premarin; Ms.McGowan]-reasonable to continue vaginal Premarin given quality of life benefits. ? ?Thank you, Dr. Claudine Mouton for allowing me to participate in the care of your pleasant patient. Please do not hesitate to contact me with questions or concerns in the interim.  ? ?# DISPOSITION:  ?# no labs today ?# follow up as needed-Dr.B ?

## 2021-11-10 NOTE — Progress Notes (Signed)
[facility] ?CONSULT NOTE ? ?Patient Care Team: ?Abigail Malone, Abigail C, MD as PCP - General (Family Medicine) ? ?CHIEF COMPLAINTS/PURPOSE OF CONSULTATION: high risk for Breast cancer ? ?  ?FINDINGS: ?Additional mammographic views of the right breast demonstrate a 1.2 ?cm group of indeterminate calcifications in the lower outer right ?breast, middle to posterior depth. No associated mass. ?  ?IMPRESSION: ?Indeterminate right breast calcifications, for which stereotactic ?core needle biopsy is recommended. ?  ?RECOMMENDATION: ?One site stereotactic core needle biopsy of the right breast. ?  ?I have discussed the findings and recommendations with the patient. ?If applicable, a reminder letter will be sent to the patient ?regarding the next appointment. ?  ?BI-RADS CATEGORY  4: Suspicious. ? DIAGNOSIS:  ?A. BREAST, RIGHT UPPER OUTER QUADRANT; STEREOTACTIC CORE NEEDLE BIOPSY:  ?- ATYPICAL DUCTAL HYPERPLASIA.  ?- BACKGROUND FIBROCYSTIC AND COLUMNAR CELL CHANGE, WITH FOCAL  ?PSEUDOANGIOMATOUS STROMAL HYPERPLASIA.  ?- CALCIFICATIONS ASSOCIATED WITH BENIGN AND ATYPICAL MAMMARY ELEMENTS.  ?- NEGATIVE FOR DUCTAL CARCINOMA IN SITU AND MALIGNANCY.  ? ?Comment:  ?Atypical ductal hyperplasia is identified in 3 of 5 sampled tissue  ?blocks, spanning up to 1 mm in greatest linear extent.  ?# DIAGNOSIS:  ?A. BREAST, RIGHT, WIDE EXCISION:  ?- FOCAL RESIDUAL ATYPICAL DUCTAL HYPERPLASIA ASSOCIATED WITH PREVIOUS  ?BIOPSY SITE.  ?- BIOPSY CLIP IDENTIFIED.  ?- FIBROCYSTIC CHANGES, BACKGROUND COLUMNAR CELL CHANGE WITH APOCRINE  ?METAPLASIA.  ?- MICROCALCIFICATIONS PRESENT.  ?- NEGATIVE FOR DUCTAL CARCINOMA IN SITU AND INVASIVE CARCINOMA.  ?- MARGINS NEGATIVE FOR ATYPICAL DUCTAL HYPERPLASIA. ?Oncology History  ? No history exists.  ? ? ? ?HISTORY OF PRESENTING ILLNESS: ambulating independently. With Niece.  ? ?Abigail Malone 71 y.o.  female female with no prior history of breast cancer/recently evaluated at the breast clinic/and  with Atypical ductal hyperplasia is referred to us for further evaluation recommendations. ? ?Patient history of overactive bladder-for which she is on Premarin vaginal cream. ? ?Otherwise patient denies any regular medications at home.  States that she has been off her blood pressure medication for the last few months. ? ? ?Review of Systems  ?Constitutional:  Negative for chills, diaphoresis, fever, malaise/fatigue and weight loss.  ?HENT:  Negative for nosebleeds and sore throat.   ?Eyes:  Negative for double vision.  ?Respiratory:  Negative for cough, hemoptysis, sputum production, shortness of breath and wheezing.   ?Cardiovascular:  Negative for chest pain, palpitations, orthopnea and leg swelling.  ?Gastrointestinal:  Negative for abdominal pain, blood in stool, constipation, diarrhea, heartburn, melena, nausea and vomiting.  ?Genitourinary:  Negative for dysuria, frequency and urgency.  ?Musculoskeletal:  Negative for back pain and joint pain.  ?Skin: Negative.  Negative for itching and rash.  ?Neurological:  Negative for dizziness, tingling, focal weakness, weakness and headaches.  ?Endo/Heme/Allergies:  Does not bruise/bleed easily.  ?Psychiatric/Behavioral:  Negative for depression. The patient is not nervous/anxious and does not have insomnia.    ? ?MEDICAL HISTORY:  ?Past Medical History:  ?Diagnosis Date  ? Abnormal uterine bleeding (AUB) 05/15/2016  ? Allergic rhinitis, seasonal 12/09/2014  ? Anemia, unspecified 11/18/2013  ? HTN (hypertension) 11/18/2013  ? Hypertension   ? Menopausal symptoms 08/05/2014  ? Right knee pain 07/26/2015  ? ? ?SURGICAL HISTORY: ?Past Surgical History:  ?Procedure Laterality Date  ? BREAST BIOPSY Right 09/07/2021  ? stereo calcs, ribbon clip, path pending.  ? BREAST BIOPSY WITH RADIO FREQUENCY LOCALIZER Right 10/18/2021  ? Procedure: BREAST BIOPSY WITH RADIO FREQUENCY LOCALIZER;  Surgeon: Campbell Lernerodenberg, Denny, MD;  Location:  [facility] ORS;  Service: General;  Laterality: Right;  ?  COLONOSCOPY  ~2018  ? ? ?SOCIAL HISTORY: ?Social History  ? ?Socioeconomic History  ? Marital status: Married  ?  Spouse name: Not on file  ? Number of children: Not on file  ? Years of education: Not on file  ? Highest education level: Not on file  ?Occupational History  ? Not on file  ?Tobacco Use  ? Smoking status: Former  ?  Packs/day: 0.50  ?  Years: 8.00  ?  Pack years: 4.00  ?  Types: Cigarettes  ?  Quit date: 12/22/2006  ?  Years since quitting: 14.8  ?  Passive exposure: Never  ? Smokeless tobacco: Never  ?Vaping Use  ? Vaping Use: Never used  ?Substance and Sexual Activity  ? Alcohol use: Not Currently  ? Drug use: No  ? Sexual activity: Not Currently  ?Other Topics Concern  ? Not on file  ?Social History Narrative  ? Not on file  ? ?Social Determinants of Health  ? ?Financial Resource Strain: Not on file  ?Food Insecurity: Not on file  ?Transportation Needs: Not on file  ?Physical Activity: Not on file  ?Stress: Not on file  ?Social Connections: Not on file  ?Intimate Partner Violence: Not on file  ? ? ?FAMILY HISTORY: ?Family History  ?Problem Relation Age of Onset  ? Cancer Mother   ?     Liver  ? Stroke Mother   ? Hypertension Mother   ? Hematuria Mother   ? Liver cancer Mother   ? Cancer Father   ? Prostate cancer Father   ? Kidney failure Brother   ? Breast cancer Neg Hx   ? ? ?ALLERGIES:  is allergic to amlodipine. ? ?MEDICATIONS:  ?Current Outpatient Medications  ?Medication Sig Dispense Refill  ? acetaminophen (TYLENOL) 500 MG tablet Take 1,000 mg by mouth every 6 (six) hours as needed for moderate pain.    ? Cholecalciferol (VITAMIN D) 50 MCG (2000 UT) tablet Take 2,000 Units by mouth daily.    ? Coenzyme Q10 200 MG capsule Take 200 mg by mouth daily.    ? conjugated estrogens (PREMARIN) vaginal cream Estrogen Cream Instruction Discard applicator Apply pea sized amount to tip of finger to urethra before bed. Wash hands well after application. Use Monday, Wednesday and Friday 30 g 12  ? ibuprofen  (ADVIL) 800 MG tablet TAKE 1 TABLET(800 MG) BY MOUTH EVERY 8 HOURS AS NEEDED 30 tablet 0  ? montelukast (SINGULAIR) 10 MG tablet Take 1 tablet (10 mg total) by mouth at bedtime. 30 tablet 3  ? nystatin cream (MYCOSTATIN) Apply 1 application. topically 2 (two) times daily. 30 g 0  ? Omega-3 Fatty Acids (FISH OIL) 1200 MG CAPS Take 1,200 mg by mouth daily.    ? oxybutynin (DITROPAN-XL) 10 MG 24 hr tablet Take 10 mg by mouth daily.    ? ?No current facility-administered medications for this visit.  ? ? ?  ?. ? ?PHYSICAL EXAMINATION: ? ?Vitals:  ? 11/10/21 1057 11/10/21 1134  ?BP: (!) 228/94 (!) 225/83  ?Pulse: 72   ?Temp: 99.1 ?F (37.3 ?Malone)   ?SpO2: 100%   ? ?Filed Weights  ? 11/10/21 1057  ?Weight: 175 lb 3.2 oz (79.5 kg)  ? ?Right breast exam-lumpectomy scar well-healing.  No signs of infection.  Exam done in presence of chaperone. ? ?Physical Exam ?Vitals and nursing note reviewed.  ?HENT:  ?   Head: Normocephalic and atraumatic.  ?  Mouth/Throat:  ?   Pharynx: Oropharynx is clear.  ?Eyes:  ?   Extraocular Movements: Extraocular movements intact.  ?   Pupils: Pupils are equal, round, and reactive to light.  ?Cardiovascular:  ?   Rate and Rhythm: Normal rate and regular rhythm.  ?Pulmonary:  ?   Comments: Decreased breath sounds bilaterally.  ?Abdominal:  ?   Palpations: Abdomen is soft.  ?Musculoskeletal:     ?   General: Normal range of motion.  ?   Cervical back: Normal range of motion.  ?Skin: ?   General: Skin is warm.  ?Neurological:  ?   General: No focal deficit present.  ?   Mental Status: She is alert and oriented to person, place, and time.  ?Psychiatric:     ?   Behavior: Behavior normal.     ?   Judgment: Judgment normal.  ? ? ?LABORATORY DATA:  ?I have reviewed the data as listed ?Lab Results  ?Component Value Date  ? WBC 7.6 10/12/2021  ? HGB 11.3 (L) 10/12/2021  ? HCT 34.9 (L) 10/12/2021  ? MCV 80.4 10/12/2021  ? PLT 257 10/12/2021  ? ?Recent Labs  ?  05/16/21 ?1029 10/12/21 ?1001  ?NA 141 141  ?K 3.8  3.7  ?CL 101 102  ?CO2 28 26  ?GLUCOSE 90 79  ?BUN 11 10  ?CREATININE 0.66 0.56  ?CALCIUM 9.8 9.4  ?GFRNONAA  --  >60  ?PROT  --  7.9  ?ALBUMIN 4.3 3.7  ?AST  --  18  ?ALT  --  12  ?ALKPHOS  --  71  ?BILITOT  -

## 2021-11-14 ENCOUNTER — Ambulatory Visit (INDEPENDENT_AMBULATORY_CARE_PROVIDER_SITE_OTHER): Payer: PPO | Admitting: Family Medicine

## 2021-11-14 ENCOUNTER — Encounter: Payer: Self-pay | Admitting: Family Medicine

## 2021-11-14 VITALS — BP 168/98 | HR 72 | Ht 59.5 in | Wt 175.0 lb

## 2021-11-14 DIAGNOSIS — I10 Essential (primary) hypertension: Secondary | ICD-10-CM

## 2021-11-14 DIAGNOSIS — J301 Allergic rhinitis due to pollen: Secondary | ICD-10-CM | POA: Diagnosis not present

## 2021-11-14 MED ORDER — MONTELUKAST SODIUM 10 MG PO TABS: 10.0000 mg | ORAL_TABLET | Freq: Every day | ORAL | 3 refills | Status: DC

## 2021-11-14 MED ORDER — LISINOPRIL-HYDROCHLOROTHIAZIDE 10-12.5 MG PO TABS: 1.0000 | ORAL_TABLET | Freq: Every day | ORAL | 3 refills | Status: DC

## 2021-11-14 NOTE — Progress Notes (Signed)
? ? ?Date:  11/14/2021  ? ?Name:  Abigail Malone   DOB:  15-Feb-1951   MRN:  [id number] ? ? ?Chief Complaint: Allergic Rhinitis  and Hypertension (Has had high readings at other offices. Was taking lisinopril/HCTZ) ? ?Hypertension ?This is a recurrent problem. The current episode started more than 1 year ago. The problem has been gradually worsening since onset. The problem is uncontrolled. Pertinent negatives include no chest pain, headaches, neck pain, orthopnea, palpitations, PND or shortness of breath. There are no associated agents to hypertension. Treatments tried: amlodipine/hctc and lisinopril.  ? ?Lab Results  ?Component Value Date  ? NA 141 10/12/2021  ? K 3.7 10/12/2021  ? CO2 26 10/12/2021  ? GLUCOSE 79 10/12/2021  ? BUN 10 10/12/2021  ? CREATININE 0.56 10/12/2021  ? CALCIUM 9.4 10/12/2021  ? EGFR 94 05/16/2021  ? GFRNONAA >60 10/12/2021  ? ?Lab Results  ?Component Value Date  ? CHOL 223 (H) 05/16/2021  ? HDL 56 05/16/2021  ? LDLCALC 142 (H) 05/16/2021  ? TRIG 140 05/16/2021  ? ?No results found for: TSH ?No results found for: HGBA1C ?Lab Results  ?Component Value Date  ? WBC 7.6 10/12/2021  ? HGB 11.3 (L) 10/12/2021  ? HCT 34.9 (L) 10/12/2021  ? MCV 80.4 10/12/2021  ? PLT 257 10/12/2021  ? ?Lab Results  ?Component Value Date  ? ALT 12 10/12/2021  ? AST 18 10/12/2021  ? ALKPHOS 71 10/12/2021  ? BILITOT 1.0 10/12/2021  ? ?No results found for: 25OHVITD2, [place], VD25OH  ? ?Review of Systems  ?Constitutional:  Negative for chills and fever.  ?HENT:  Negative for drooling, ear discharge, ear pain and sore throat.   ?Respiratory:  Negative for cough, shortness of breath and wheezing.   ?Cardiovascular:  Negative for chest pain, palpitations, orthopnea, leg swelling and PND.  ?Gastrointestinal:  Negative for abdominal pain, blood in stool, constipation, diarrhea and nausea.  ?Endocrine: Negative for polydipsia.  ?Genitourinary:  Negative for dysuria, frequency, hematuria and urgency.  ?Musculoskeletal:   Negative for back pain, myalgias and neck pain.  ?Skin:  Negative for rash.  ?Allergic/Immunologic: Negative for environmental allergies.  ?Neurological:  Negative for dizziness and headaches.  ?Hematological:  Does not bruise/bleed easily.  ?Psychiatric/Behavioral:  Negative for suicidal ideas. The patient is not nervous/anxious.   ? ?Patient Active Problem List  ? Diagnosis Date Noted  ? Atypical ductal hyperplasia of right breast 09/19/2021  ? Obesity with body mass index (BMI) of 30.0 to 39.9 01/01/2020  ? Cardiac syncope 05/18/2019  ? Hyperlipidemia, mixed 05/18/2019  ? Mixed stress and urge urinary incontinence 04/01/2018  ? Abnormal uterine bleeding (AUB) 05/15/2016  ? Right knee pain 07/26/2015  ? Allergic rhinitis, seasonal 12/09/2014  ? Menopausal symptoms 08/05/2014  ? HTN (hypertension) 11/18/2013  ? Anemia, unspecified 11/18/2013  ? ? ?Allergies  ?Allergen Reactions  ? Amlodipine   ?  Leg cramping  ? ? ?Past Surgical History:  ?Procedure Laterality Date  ? BREAST BIOPSY Right 09/07/2021  ? stereo calcs, ribbon clip, path pending.  ? BREAST BIOPSY WITH RADIO FREQUENCY LOCALIZER Right 10/18/2021  ? Procedure: BREAST BIOPSY WITH RADIO FREQUENCY LOCALIZER;  Surgeon: Ronny Bacon, MD;  Location: [facility] ORS;  Service: General;  Laterality: Right;  ? COLONOSCOPY  ~2018  ? ? ?Social History  ? ?Tobacco Use  ? Smoking status: Former  ?  Packs/day: 0.50  ?  Years: 8.00  ?  Pack years: 4.00  ?  Types: Cigarettes  ?  Quit date:  12/22/2006  ?  Years since quitting: 14.9  ?  Passive exposure: Never  ? Smokeless tobacco: Never  ?Vaping Use  ? Vaping Use: Never used  ?Substance Use Topics  ? Alcohol use: Not Currently  ? Drug use: No  ? ? ? ?Medication list has been reviewed and updated. ? ?Current Meds  ?Medication Sig  ? acetaminophen (TYLENOL) 500 MG tablet Take 1,000 mg by mouth every 6 (six) hours as needed for moderate pain.  ? Cholecalciferol (VITAMIN D) 50 MCG (2000 UT) tablet Take 2,000 Units by mouth daily.  ?  Coenzyme Q10 200 MG capsule Take 200 mg by mouth daily.  ? conjugated estrogens (PREMARIN) vaginal cream Estrogen Cream Instruction Discard applicator Apply pea sized amount to tip of finger to urethra before bed. Wash hands well after application. Use Monday, Wednesday and Friday  ? ibuprofen (ADVIL) 800 MG tablet TAKE 1 TABLET(800 MG) BY MOUTH EVERY 8 HOURS AS NEEDED  ? montelukast (SINGULAIR) 10 MG tablet Take 1 tablet (10 mg total) by mouth at bedtime.  ? nystatin cream (MYCOSTATIN) Apply 1 application. topically 2 (two) times daily.  ? Omega-3 Fatty Acids (FISH OIL) 1200 MG CAPS Take 1,200 mg by mouth daily.  ? oxybutynin (DITROPAN-XL) 10 MG 24 hr tablet Take 10 mg by mouth daily.  ? ? ? ?  11/14/2021  ? 10:20 AM 06/05/2021  ? 11:21 AM 04/03/2021  ?  1:52 PM  ?GAD 7 : Generalized Anxiety Score  ?Nervous, Anxious, on Edge 0 0 0  ?Control/stop worrying 0 0 0  ?Worry too much - different things 1 0 0  ?Trouble relaxing 1 0 0  ?Restless 0 0 0  ?Easily annoyed or irritable 0 0 0  ?Afraid - awful might happen 0 0 0  ?Total GAD 7 Score 2 0 0  ?Anxiety Difficulty Not difficult at all Not difficult at all   ? ? ? ?  11/14/2021  ? 10:20 AM  ?Depression screen PHQ 2/9  ?Decreased Interest 0  ?Down, Depressed, Hopeless 0  ?PHQ - 2 Score 0  ?Altered sleeping 0  ?Tired, decreased energy 0  ?Change in appetite 0  ?Feeling bad or failure about yourself  0  ?Trouble concentrating 0  ?Moving slowly or fidgety/restless 0  ?Suicidal thoughts 0  ?PHQ-9 Score 0  ?Difficult doing work/chores Not difficult at all  ? ? ?BP Readings from Last 3 Encounters:  ?11/14/21 (!) 198/100  ?11/10/21 (!) 225/83  ?11/02/21 (!) 190/75  ? ? ?Physical Exam ?Vitals and nursing note reviewed.  ?Constitutional:   ?   Appearance: She is well-developed.  ?HENT:  ?   Head: Normocephalic.  ?   Right Ear: Tympanic membrane, ear canal and external ear normal. There is no impacted cerumen.  ?   Left Ear: Tympanic membrane, ear canal and external ear normal. There  is no impacted cerumen.  ?Eyes:  ?   General: Lids are everted, no foreign bodies appreciated. No scleral icterus.    ?   Left eye: No foreign body or hordeolum.  ?   Conjunctiva/sclera: Conjunctivae normal.  ?   Right eye: Right conjunctiva is not injected.  ?   Left eye: Left conjunctiva is not injected.  ?   Pupils: Pupils are equal, round, and reactive to light.  ?Neck:  ?   Thyroid: No thyromegaly.  ?   Vascular: No JVD.  ?   Trachea: No tracheal deviation.  ?Cardiovascular:  ?   Rate and Rhythm: Normal  rate and regular rhythm.  ?   Heart sounds: Normal heart sounds, S1 normal and S2 normal. No murmur heard. ?No systolic murmur is present.  ?No diastolic murmur is present.  ?  No friction rub. No gallop. No S3 or S4 sounds.  ?Pulmonary:  ?   Effort: Pulmonary effort is normal. No respiratory distress.  ?   Breath sounds: Normal breath sounds. No wheezing, rhonchi or rales.  ?Abdominal:  ?   General: Bowel sounds are normal.  ?   Palpations: Abdomen is soft. There is no mass.  ?   Tenderness: There is no abdominal tenderness. There is no guarding or rebound.  ?Musculoskeletal:     ?   General: No tenderness. Normal range of motion.  ?   Cervical back: Normal range of motion and neck supple.  ?Lymphadenopathy:  ?   Cervical: No cervical adenopathy.  ?Skin: ?   General: Skin is warm.  ?   Findings: No rash.  ?Neurological:  ?   Mental Status: She is alert and oriented to person, place, and time.  ?   Cranial Nerves: No cranial nerve deficit.  ?   Deep Tendon Reflexes: Reflexes normal.  ?Psychiatric:     ?   Mood and Affect: Mood is not anxious or depressed.  ? ? ?Wt Readings from Last 3 Encounters:  ?11/14/21 175 lb (79.4 kg)  ?11/10/21 175 lb 3.2 oz (79.5 kg)  ?11/02/21 173 lb (78.5 kg)  ? ? ?BP (!) 198/100   Pulse 72   Ht 4' 11.5" (1.511 m)   Wt 175 lb (79.4 kg)   BMI 34.75 kg/m?  ? ?Assessment and Plan: ? ?1. Primary hypertension ?Chronic.  Uncontrolled.  Stable.  Patient's had multiple increased readings  at specialty clinics.  Review of patient's previous encounters and treatments patient in 2019 was on lisinopril 20-25 mg per Dr. Dorthula Perfect.  We will resume lisinopril hydrochlorothiazide but add a 10-12.5 mg dos

## 2021-12-05 ENCOUNTER — Telehealth: Payer: Self-pay | Admitting: Family Medicine

## 2021-12-05 NOTE — Telephone Encounter (Signed)
Copied from CRM [id number]. Topic: Medicare AWV ?>> Dec 05, 2021  1:52 PM Claudette Laws R wrote: ?Reason for CRM:  ?Left message for patient to call back and schedule Medicare Annual Wellness Visit (AWV) in office.  ? ?If unable to come into the office for AWV,  please offer to do virtually or by telephone. ? ?No hx of AWV eligible for AWVI per [employer] as of 12/21/2016 ? ?Please schedule at anytime with [facility] Advisor.     ? ?45 minute appointment  ? ?Any questions, please call me at [phone] ?

## 2021-12-06 ENCOUNTER — Inpatient Hospital Stay: Payer: PPO | Attending: Internal Medicine | Admitting: Hospice and Palliative Medicine

## 2021-12-06 DIAGNOSIS — N6091 Unspecified benign mammary dysplasia of right breast: Secondary | ICD-10-CM

## 2021-12-06 NOTE — Progress Notes (Signed)
Multidisciplinary Oncology Council Documentation ? ?[name] was presented by our [facility] on 12/06/2021, which included representatives from:  ?Palliative Care ?Dietitian  ?Physical/Occupational Therapist ?Nurse Navigator ?Genetics ?Speech Therapist ?Social work ?Survivorship RN ?Financial Navigator ?Research RN ? ? ?Abigail Malone currently presents with history of atypical ductal hyperplasia ? ?We reviewed previous medical and familial history, history of present illness, and recent lab results along with all available histopathologic and imaging studies. The MOC considered available treatment options and made the following recommendations/referrals: ? ?None ? ?The MOC is a meeting of clinicians from various specialty areas who evaluate and discuss patients for whom a multidisciplinary approach is being considered. Final determinations in the plan of care are those of the provider(s).  ? ?Today's extended care, comprehensive team conference, Abigail Malone was not present for the discussion and was not examined.  ? ?

## 2021-12-12 ENCOUNTER — Encounter: Payer: Self-pay | Admitting: Family Medicine

## 2021-12-12 ENCOUNTER — Ambulatory Visit (INDEPENDENT_AMBULATORY_CARE_PROVIDER_SITE_OTHER): Payer: PPO | Admitting: Family Medicine

## 2021-12-12 VITALS — BP 120/80 | HR 76 | Ht 59.5 in | Wt 170.0 lb

## 2021-12-12 DIAGNOSIS — I10 Essential (primary) hypertension: Secondary | ICD-10-CM | POA: Diagnosis not present

## 2021-12-12 MED ORDER — LISINOPRIL-HYDROCHLOROTHIAZIDE 10-12.5 MG PO TABS: 1.0000 | ORAL_TABLET | Freq: Every day | ORAL | 1 refills | Status: DC

## 2021-12-12 NOTE — Progress Notes (Signed)
Date:  12/12/2021   Name:  Abigail Malone   DOB:  [birth year]   MRN:  [id number]   Chief Complaint: Hypertension (Follow up bp recheck)  Hypertension This is a chronic problem. The current episode started more than 1 year ago. The problem has been gradually improving since onset. The problem is controlled. Pertinent negatives include no anxiety, blurred vision, chest pain, headaches, orthopnea, palpitations, PND or shortness of breath. Past treatments include ACE inhibitors and diuretics. The current treatment provides moderate improvement. There is no history of angina, kidney disease, CAD/MI, CVA, heart failure, left ventricular hypertrophy, PVD or retinopathy. There is no history of chronic renal disease, a hypertension causing med or renovascular disease.   Lab Results  Component Value Date   NA 141 10/12/2021   K 3.7 10/12/2021   CO2 26 10/12/2021   GLUCOSE 79 10/12/2021   BUN 10 10/12/2021   CREATININE 0.56 10/12/2021   CALCIUM 9.4 10/12/2021   EGFR 94 05/16/2021   GFRNONAA >60 10/12/2021   Lab Results  Component Value Date   CHOL 223 (H) 05/16/2021   HDL 56 05/16/2021   LDLCALC 142 (H) 05/16/2021   TRIG 140 05/16/2021   No results found for: TSH No results found for: HGBA1C Lab Results  Component Value Date   WBC 7.6 10/12/2021   HGB 11.3 (L) 10/12/2021   HCT 34.9 (L) 10/12/2021   MCV 80.4 10/12/2021   PLT 257 10/12/2021   Lab Results  Component Value Date   ALT 12 10/12/2021   AST 18 10/12/2021   ALKPHOS 71 10/12/2021   BILITOT 1.0 10/12/2021   No results found for: 25OHVITD2, 25OHVITD3, VD25OH   Review of Systems  Eyes:  Negative for blurred vision.  Respiratory:  Negative for cough, shortness of breath and wheezing.   Cardiovascular:  Negative for chest pain, palpitations, orthopnea, leg swelling and PND.  Genitourinary:  Negative for frequency.  Neurological:  Negative for headaches.   Patient Active Problem List   Diagnosis Date Noted    Atypical ductal hyperplasia of right breast 09/19/2021   Obesity with body mass index (BMI) of 30.0 to 39.9 01/01/2020   Cardiac syncope 05/18/2019   Hyperlipidemia, mixed 05/18/2019   Mixed stress and urge urinary incontinence 04/01/2018   Abnormal uterine bleeding (AUB) 05/15/2016   Right knee pain 07/26/2015   Allergic rhinitis, seasonal 12/09/2014   Menopausal symptoms 08/05/2014   HTN (hypertension) 11/18/2013   Anemia, unspecified 11/18/2013    Allergies  Allergen Reactions   Amlodipine     Leg cramping    Past Surgical History:  Procedure Laterality Date   BREAST BIOPSY Right 09/07/2021   stereo calcs, ribbon clip, path pending.   BREAST BIOPSY WITH RADIO FREQUENCY LOCALIZER Right 10/18/2021   Procedure: BREAST BIOPSY WITH RADIO FREQUENCY LOCALIZER;  Surgeon: Ronny Bacon, MD;  Location: [facility] ORS;  Service: General;  Laterality: Right;   COLONOSCOPY  ~2018    Social History   Tobacco Use   Smoking status: Former    Packs/day: 0.50    Years: 8.00    Pack years: 4.00    Types: Cigarettes    Quit date: 12/22/2006    Years since quitting: 14.9    Passive exposure: Never   Smokeless tobacco: Never  Vaping Use   Vaping Use: Never used  Substance Use Topics   Alcohol use: Not Currently   Drug use: No     Medication list has been reviewed and updated.  Current Meds  Medication Sig   acetaminophen (TYLENOL) 500 MG tablet Take 1,000 mg by mouth every 6 (six) hours as needed for moderate pain.   Cholecalciferol (VITAMIN D) 50 MCG (2000 UT) tablet Take 2,000 Units by mouth daily.   Coenzyme Q10 200 MG capsule Take 200 mg by mouth daily.   conjugated estrogens (PREMARIN) vaginal cream Estrogen Cream Instruction Discard applicator Apply pea sized amount to tip of finger to urethra before bed. Wash hands well after application. Use Monday, Wednesday and Friday   ibuprofen (ADVIL) 800 MG tablet TAKE 1 TABLET(800 MG) BY MOUTH EVERY 8 HOURS AS NEEDED    lisinopril-hydrochlorothiazide (ZESTORETIC) 10-12.5 MG tablet Take 1 tablet by mouth daily.   montelukast (SINGULAIR) 10 MG tablet Take 1 tablet (10 mg total) by mouth at bedtime.   nystatin cream (MYCOSTATIN) Apply 1 application. topically 2 (two) times daily.   Omega-3 Fatty Acids (FISH OIL) 1200 MG CAPS Take 1,200 mg by mouth daily.   oxybutynin (DITROPAN-XL) 10 MG 24 hr tablet Take 10 mg by mouth daily.       12/12/2021    9:23 AM 11/14/2021   10:20 AM 06/05/2021   11:21 AM 04/03/2021    1:52 PM  GAD 7 : Generalized Anxiety Score  Nervous, Anxious, on Edge 0 0 0 0  Control/stop worrying 0 0 0 0  Worry too much - different things 0 1 0 0  Trouble relaxing 0 1 0 0  Restless 0 0 0 0  Easily annoyed or irritable 0 0 0 0  Afraid - awful might happen 0 0 0 0  Total GAD 7 Score 0 2 0 0  Anxiety Difficulty Not difficult at all Not difficult at all Not difficult at all        12/12/2021    9:22 AM  Depression screen PHQ 2/9  Decreased Interest 0  Down, Depressed, Hopeless 0  PHQ - 2 Score 0  Altered sleeping 0  Tired, decreased energy 0  Change in appetite 0  Feeling bad or failure about yourself  0  Trouble concentrating 0  Moving slowly or fidgety/restless 0  Suicidal thoughts 0  PHQ-9 Score 0  Difficult doing work/chores Not difficult at all    BP Readings from Last 3 Encounters:  12/12/21 120/80  11/14/21 (!) 168/98  11/10/21 (!) 225/83    Physical Exam Vitals and nursing note reviewed. Exam conducted with a chaperone present.  Constitutional:      General: She is not in acute distress.    Appearance: She is not diaphoretic.  HENT:     Head: Normocephalic and atraumatic.     Right Ear: Tympanic membrane and external ear normal. There is no impacted cerumen.     Left Ear: Tympanic membrane and external ear normal. There is no impacted cerumen.     Nose: Nose normal. No congestion or rhinorrhea.  Eyes:     General:        Right eye: No discharge.        Left  eye: No discharge.     Conjunctiva/sclera: Conjunctivae normal.     Pupils: Pupils are equal, round, and reactive to light.  Neck:     Thyroid: No thyromegaly.     Vascular: No carotid bruit or JVD.  Cardiovascular:     Rate and Rhythm: Normal rate and regular rhythm.     Pulses: Normal pulses.     Heart sounds: Normal heart sounds, S1 normal and S2 normal. No murmur heard. No  systolic murmur is present.  No diastolic murmur is present.    No friction rub. No gallop. No S3 or S4 sounds.  Pulmonary:     Effort: Pulmonary effort is normal.     Breath sounds: Normal breath sounds. No wheezing, rhonchi or rales.  Abdominal:     General: Bowel sounds are normal.     Palpations: Abdomen is soft. There is no mass.     Tenderness: There is no abdominal tenderness. There is no guarding.  Musculoskeletal:        General: Normal range of motion.     Cervical back: Normal range of motion and neck supple.  Lymphadenopathy:     Cervical: No cervical adenopathy.  Skin:    General: Skin is warm and dry.  Neurological:     Mental Status: She is alert.    Wt Readings from Last 3 Encounters:  12/12/21 170 lb (77.1 kg)  11/14/21 175 lb (79.4 kg)  11/10/21 175 lb 3.2 oz (79.5 kg)    BP 120/80   Pulse 76   Ht 4' 11.5" (1.511 m)   Wt 170 lb (77.1 kg)   BMI 33.76 kg/m   Assessment and Plan:  1. Primary hypertension Chronic.  Controlled.  Stable.  Blood pressure today 120/80.  With the addition of hydrochlorothiazide to the lisinopril 10 mg patient is tolerating and blood pressure has been brought into normal range.  We will check renal function panel for electrolyte concerns and GFR.  We will recheck patient in 6 months. - Renal Function Panel - lisinopril-hydrochlorothiazide (ZESTORETIC) 10-12.5 MG tablet; Take 1 tablet by mouth daily.  Dispense: 90 tablet; Refill: 1

## 2021-12-13 LAB — RENAL FUNCTION PANEL
Albumin: 4.4 g/dL (ref 3.8–4.8)
BUN/Creatinine Ratio: 14 (ref 12–28)
BUN: 9 mg/dL (ref 8–27)
CO2: 27 mmol/L (ref 20–29)
Calcium: 9.8 mg/dL (ref 8.7–10.3)
Chloride: 102 mmol/L (ref 96–106)
Creatinine, Ser: 0.66 mg/dL (ref 0.57–1.00)
Glucose: 93 mg/dL (ref 70–99)
Phosphorus: 3.5 mg/dL (ref 3.0–4.3)
Potassium: 3.5 mmol/L (ref 3.5–5.2)
Sodium: 143 mmol/L (ref 134–144)
eGFR: 94 mL/min/{1.73_m2} (ref >59)

## 2022-02-14 ENCOUNTER — Ambulatory Visit: Payer: PPO | Admitting: Nurse Practitioner

## 2022-02-14 ENCOUNTER — Encounter: Payer: Self-pay | Admitting: Nurse Practitioner

## 2022-02-14 DIAGNOSIS — Z0000 Encounter for general adult medical examination without abnormal findings: Secondary | ICD-10-CM

## 2022-02-14 NOTE — Progress Notes (Signed)
I connected with  Abigail Malone on 02/14/22 by a audio enabled telemedicine application and verified that I am speaking with the correct person using two identifiers.  Patient Location: Home  Provider Location: Home Office  I discussed the limitations of evaluation and management by telemedicine. The patient expressed understanding and agreed to proceed.     Annual Wellness Visit     Patient: Abigail Malone, Female    DOB: [birth year], 71 y.o.   MRN: [id number]  Subjective  Chief Complaint  Patient presents with   Annual well visit     Abigail Malone is a 71 y.o. female who presents today for her Annual Wellness Visit. She reports consuming a general diet. Home exercise routine includes walking 20 minutes per day. She generally feels well. She reports sleeping well. She does not have additional problems to discuss today.   HPI  Vision:Within last year and Dental: No current dental problems and Receives regular dental care   Patient Active Problem List   Diagnosis Date Noted   Atypical ductal hyperplasia of right breast 09/19/2021   Obesity with body mass index (BMI) of 30.0 to 39.9 01/01/2020   Cardiac syncope 05/18/2019   Hyperlipidemia, mixed 05/18/2019   Mixed stress and urge urinary incontinence 04/01/2018   Abnormal uterine bleeding (AUB) 05/15/2016   Right knee pain 07/26/2015   Allergic rhinitis, seasonal 12/09/2014   Menopausal symptoms 08/05/2014   HTN (hypertension) 11/18/2013   Anemia, unspecified 11/18/2013   Past Medical History:  Diagnosis Date   Abnormal uterine bleeding (AUB) 05/15/2016   Allergic rhinitis, seasonal 12/09/2014   Anemia, unspecified 11/18/2013   HTN (hypertension) 11/18/2013   Hypertension    Menopausal symptoms 08/05/2014   Right knee pain 07/26/2015      Medications: Outpatient Medications Prior to Visit  Medication Sig   acetaminophen (TYLENOL) 500 MG tablet Take 1,000 mg by mouth every 6 (six) hours as needed for moderate  pain.   Cholecalciferol (VITAMIN D) 50 MCG (2000 UT) tablet Take 2,000 Units by mouth daily.   Coenzyme Q10 200 MG capsule Take 200 mg by mouth daily.   conjugated estrogens (PREMARIN) vaginal cream Estrogen Cream Instruction Discard applicator Apply pea sized amount to tip of finger to urethra before bed. Wash hands well after application. Use Monday, Wednesday and Friday   ibuprofen (ADVIL) 800 MG tablet TAKE 1 TABLET(800 MG) BY MOUTH EVERY 8 HOURS AS NEEDED   lisinopril-hydrochlorothiazide (ZESTORETIC) 10-12.5 MG tablet Take 1 tablet by mouth daily.   montelukast (SINGULAIR) 10 MG tablet Take 1 tablet (10 mg total) by mouth at bedtime.   nystatin cream (MYCOSTATIN) Apply 1 application. topically 2 (two) times daily.   Omega-3 Fatty Acids (FISH OIL) 1200 MG CAPS Take 1,200 mg by mouth daily.   oxybutynin (DITROPAN-XL) 10 MG 24 hr tablet Take 10 mg by mouth daily.   No facility-administered medications prior to visit.    Allergies  Allergen Reactions   Amlodipine     Leg cramping    Patient Care Team: Duanne Limerick, MD as PCP - General (Family Medicine)      Objective   Physical Exam No physical exam performed today telehealth visit    Most recent functional status assessment:    02/14/2022    9:05 AM  In your present state of health, do you have any difficulty performing the following activities:  Hearing? 0  Vision? 0  Difficulty concentrating or making decisions? 0  Walking or climbing stairs? 0  Dressing or bathing? 0  Doing errands, shopping? 0  Preparing Food and eating ? N  Using the Toilet? N  In the past six months, have you accidently leaked urine? Y  Do you have problems with loss of bowel control? N  Managing your Medications? N  Managing your Finances? N  Housekeeping or managing your Housekeeping? N   Most recent fall risk assessment:    02/14/2022    9:04 AM  Fall Risk   Falls in the past year? 0  Number falls in past yr: 0  Injury with Fall? 0   Risk for fall due to : No Fall Risks  Follow up Falls evaluation completed    Most recent depression screenings:    02/14/2022    9:04 AM 02/14/2022    8:59 AM  PHQ 2/9 Scores  PHQ - 2 Score 0 0   Most recent cognitive screening:    02/14/2022    9:08 AM  6CIT Screen  What Year? 0 points  What month? 0 points  What time? 0 points  Count back from 20 0 points  Months in reverse 0 points  Repeat phrase 0 points  Total Score 0 points   Most recent Audit-C alcohol use screening    02/14/2022    9:02 AM  Alcohol Use Disorder Test (AUDIT)  1. How often do you have a drink containing alcohol? 0  2. How many drinks containing alcohol do you have on a typical day when you are drinking? 0  3. How often do you have six or more drinks on one occasion? 0  AUDIT-C Score 0   A score of 3 or more in women, and 4 or more in men indicates increased risk for alcohol abuse, EXCEPT if all of the points are from question 1    Assessment & Plan   Annual wellness visit done today including the all of the following: Reviewed patient's Family Medical History Reviewed and updated list of patient's medical providers Assessment of cognitive impairment was done Assessed patient's functional ability Established a written schedule for health screening services Health Risk Assessent Completed and Reviewed  Exercise Activities and Dietary recommendations  Goals      Set My Weight Loss Goal     Would like to continue to work on weight loss through diet and exercise.         Immunization History  Administered Date(s) Administered   Fluad Quad(high Dose 65+) 04/03/2021   Moderna Sars-Covid-2 Vaccination 09/04/2019, 10/05/2019, 06/17/2020    Health Maintenance  Topic Date Due   COVID-19 Vaccine (4 - Booster for Moderna series) 08/12/2020   DEXA SCAN  04/03/2022 (Originally 01/20/2016)   TETANUS/TDAP  04/03/2022 (Originally 01/19/1970)   Pneumonia Vaccine 27+ Years old (1 - PCV) 05/16/2022  (Originally 01/20/2016)   INFLUENZA VACCINE  02/20/2022   MAMMOGRAM  08/15/2023   COLONOSCOPY (Pts 45-16yrs Insurance coverage will need to be confirmed)  08/16/2026   HPV VACCINES  Aged Out   Hepatitis C Screening  Discontinued   Zoster Vaccines- Shingrix  Discontinued     Discussed health benefits of physical activity, and encouraged her to engage in regular exercise appropriate for her age and condition.    Problem List Items Addressed This Visit   None  Would like to discuss referral to dermatologist with PCP will send note with report   Recommended 4th COVID vaccine, patient will receive at her local pharmacy.   All other Health Maintenance up to date  Viviano Simas, FNP

## 2022-02-14 NOTE — Patient Instructions (Addendum)
Abigail Malone , Thank you for taking time to come for your Medicare Wellness Visit. I appreciate your ongoing commitment to your health goals. Please review the following plan we discussed and let me know if I can assist you in the future.   Screening recommendations/referrals: Colonoscopy: 08/16/2026 Mammogram: 08/15/2023 Bone Density: 04/03/2022 Recommended yearly ophthalmology/optometry visit for glaucoma screening and checkup Recommended yearly dental visit for hygiene and checkup  Vaccinations: Influenza vaccine: 02/2022 Pneumococcal vaccine: 05/16/2022 Tdap vaccine: 04/03/2022 Shingles vaccine: has not started     Advanced directives: would like information on this has not made AD prior   Conditions/risks identified: urge incontinence ongoing   Next appointment: 06/12/22   Preventive Care 71 Years and Older, Female Preventive care refers to lifestyle choices and visits with your health care provider that can promote health and wellness. What does preventive care include? A yearly physical exam. This is also called an annual well check. Dental exams once or twice a year. Routine eye exams. Ask your health care provider how often you should have your eyes checked. Personal lifestyle choices, including: Daily care of your teeth and gums. Regular physical activity. Eating a healthy diet. Avoiding tobacco and drug use. Limiting alcohol use. Practicing safe sex. Taking low-dose aspirin every day. Taking vitamin and mineral supplements as recommended by your health care provider. What happens during an annual well check? The services and screenings done by your health care provider during your annual well check will depend on your age, overall health, lifestyle risk factors, and family history of disease. Counseling  Your health care provider may ask you questions about your: Alcohol use. Tobacco use. Drug use. Emotional well-being. Home and relationship well-being. Sexual  activity. Eating habits. History of falls. Memory and ability to understand (cognition). Work and work Astronomer. Reproductive health. Screening  You may have the following tests or measurements: Height, weight, and BMI. Blood pressure. Lipid and cholesterol levels. These may be checked every 5 years, or more frequently if you are over 52 years old. Skin check. Lung cancer screening. You may have this screening every year starting at age 71 if you have a 30-pack-year history of smoking and currently smoke or have quit within the past 15 years. Fecal occult blood test (FOBT) of the stool. You may have this test every year starting at age 71. Flexible sigmoidoscopy or colonoscopy. You may have a sigmoidoscopy every 5 years or a colonoscopy every 10 years starting at age 71. Hepatitis C blood test. Hepatitis B blood test. Sexually transmitted disease (STD) testing. Diabetes screening. This is done by checking your blood sugar (glucose) after you have not eaten for a while (fasting). You may have this done every 1-3 years. Bone density scan. This is done to screen for osteoporosis. You may have this done starting at age 71. Mammogram. This may be done every 1-2 years. Talk to your health care provider about how often you should have regular mammograms. Talk with your health care provider about your test results, treatment options, and if necessary, the need for more tests. Vaccines  Your health care provider may recommend certain vaccines, such as: Influenza vaccine. This is recommended every year. Tetanus, diphtheria, and acellular pertussis (Tdap, Td) vaccine. You may need a Td booster every 10 years. Zoster vaccine. You may need this after age 71. Pneumococcal 13-valent conjugate (PCV13) vaccine. One dose is recommended after age 71. Pneumococcal polysaccharide (PPSV23) vaccine. One dose is recommended after age 71. Talk to your health care provider about  which screenings and vaccines  you need and how often you need them. This information is not intended to replace advice given to you by your health care provider. Make sure you discuss any questions you have with your health care provider. Document Released: 08/05/2015 Document Revised: 03/28/2016 Document Reviewed: 05/10/2015 Elsevier Interactive Patient Education  2017 [employer] Prevention in the Home Falls can cause injuries. They can happen to people of all ages. There are many things you can do to make your home safe and to help prevent falls. What can I do on the outside of my home? Regularly fix the edges of walkways and driveways and fix any cracks. Remove anything that might make you trip as you walk through a door, such as a raised step or threshold. Trim any bushes or trees on the path to your home. Use bright outdoor lighting. Clear any walking paths of anything that might make someone trip, such as rocks or tools. Regularly check to see if handrails are loose or broken. Make sure that both sides of any steps have handrails. Any raised decks and porches should have guardrails on the edges. Have any leaves, snow, or ice cleared regularly. Use sand or salt on walking paths during winter. Clean up any spills in your garage right away. This includes oil or grease spills. What can I do in the bathroom? Use night lights. Install grab bars by the toilet and in the tub and shower. Do not use towel bars as grab bars. Use non-skid mats or decals in the tub or shower. If you need to sit down in the shower, use a plastic, non-slip stool. Keep the floor dry. Clean up any water that spills on the floor as soon as it happens. Remove soap buildup in the tub or shower regularly. Attach bath mats securely with double-sided non-slip rug tape. Do not have throw rugs and other things on the floor that can make you trip. What can I do in the bedroom? Use night lights. Make sure that you have a light by your bed that  is easy to reach. Do not use any sheets or blankets that are too big for your bed. They should not hang down onto the floor. Have a firm chair that has side arms. You can use this for support while you get dressed. Do not have throw rugs and other things on the floor that can make you trip. What can I do in the kitchen? Clean up any spills right away. Avoid walking on wet floors. Keep items that you use a lot in easy-to-reach places. If you need to reach something above you, use a strong step stool that has a grab bar. Keep electrical cords out of the way. Do not use floor polish or wax that makes floors slippery. If you must use wax, use non-skid floor wax. Do not have throw rugs and other things on the floor that can make you trip. What can I do with my stairs? Do not leave any items on the stairs. Make sure that there are handrails on both sides of the stairs and use them. Fix handrails that are broken or loose. Make sure that handrails are as long as the stairways. Check any carpeting to make sure that it is firmly attached to the stairs. Fix any carpet that is loose or worn. Avoid having throw rugs at the top or bottom of the stairs. If you do have throw rugs, attach them to the floor with  carpet tape. Make sure that you have a light switch at the top of the stairs and the bottom of the stairs. If you do not have them, ask someone to add them for you. What else can I do to help prevent falls? Wear shoes that: Do not have high heels. Have rubber bottoms. Are comfortable and fit you well. Are closed at the toe. Do not wear sandals. If you use a stepladder: Make sure that it is fully opened. Do not climb a closed stepladder. Make sure that both sides of the stepladder are locked into place. Ask someone to hold it for you, if possible. Clearly mark and make sure that you can see: Any grab bars or handrails. First and last steps. Where the edge of each step is. Use tools that help you  move around (mobility aids) if they are needed. These include: Canes. Walkers. Scooters. Crutches. Turn on the lights when you go into a dark area. Replace any light bulbs as soon as they burn out. Set up your furniture so you have a clear path. Avoid moving your furniture around. If any of your floors are uneven, fix them. If there are any pets around you, be aware of where they are. Review your medicines with your doctor. Some medicines can make you feel dizzy. This can increase your chance of falling. Ask your doctor what other things that you can do to help prevent falls. This information is not intended to replace advice given to you by your health care provider. Make sure you discuss any questions you have with your health care provider. Document Released: 05/05/2009 Document Revised: 12/15/2015 Document Reviewed: 08/13/2014 Elsevier Interactive Patient Education  2017 [employer].

## 2022-03-16 ENCOUNTER — Other Ambulatory Visit: Payer: Self-pay

## 2022-03-16 DIAGNOSIS — N6091 Unspecified benign mammary dysplasia of right breast: Secondary | ICD-10-CM

## 2022-04-23 ENCOUNTER — Ambulatory Visit
Admission: RE | Admit: 2022-04-23 | Discharge: 2022-04-23 | Disposition: A | Discharge status: Home / Self Care | Payer: PPO | Source: Ambulatory Visit | Attending: Surgery | Admitting: Surgery

## 2022-04-23 DIAGNOSIS — N6091 Unspecified benign mammary dysplasia of right breast: Secondary | ICD-10-CM | POA: Diagnosis not present

## 2022-04-23 DIAGNOSIS — N644 Mastodynia: Secondary | ICD-10-CM | POA: Diagnosis not present

## 2022-04-30 NOTE — Progress Notes (Deleted)
Surgical Clinic Progress/Follow-up Note   HPI:  71 y.o. Female presents to clinic for right wrist atypical ductal hyperplasia follow-up 6 months follow the last evaluation.  She presents today with diagnostic mammography of the right breast.  Patient reports *** improvement/resolution of prior issues and has been ***tolerating regular diet with ***+flatus and ***normal BM's, denies N/V, fever/chills, CP, or SOB.  Review of Systems:  Constitutional: ***denies fever/chills  ***ENT: denies sore throat, hearing problems  Respiratory: denies shortness of breath, wheezing  Cardiovascular: denies chest pain, palpitations  Gastrointestinal: ***denies abdominal pain, N/V, ***or diarrhea/and bowel function as per interval history Skin: Denies any other rashes or skin discolorations except post-surgical wounds*** as per interval history  Vital Signs:  There were no vitals taken for this visit.   Physical Exam:  Constitutional:  -- ***Normal/Obese body habitus  -- ***Awake, alert, and oriented x3  Pulmonary:  -- ***No crackles -- ***Equal breath sounds bilaterally -- ***Breathing non-labored at rest Cardiovascular:  -- S1, S2 present  -- No pericardial rubs  Gastrointestinal:  -- ***Soft and non-distended, ***non-tender/with ***mild ***peri-incisional tenderness to palpation, no guarding/rebound tenderness -- Post-surgical incisions all ***well-approximated ***without any peri-incisional erythema or drainage -- ***No abdominal masses appreciated, pulsatile or otherwise  GU  -- ***  Musculoskeletal / Integumentary:  -- Wounds or skin discoloration: ***None appreciated except post-surgical incisions*** as described above (***) -- Extremities: ***B/L UE and LE FROM, hands and feet warm, ***no edema   Laboratory studies: {Labs :18171}  Imaging:  CLINICAL DATA:  Status post stereotactic guided biopsy in February 2023 which demonstrated atypical ductal hyperplasia. Patient is status post  excisional biopsy. Patient presents for short-term follow-up. Patient reports some RIGHT breast pain.   EXAM: DIGITAL DIAGNOSTIC UNILATERAL RIGHT MAMMOGRAM WITH TOMOSYNTHESIS   TECHNIQUE: Right digital diagnostic mammography and breast tomosynthesis was performed.   COMPARISON:  Previous exam(s).   ACR Breast Density Category b: There are scattered areas of fibroglandular density.   FINDINGS: There is density and architectural distortion within the RIGHT breast, consistent with postsurgical changes. These are new in comparison to prior. No new suspicious calcifications are noted in the RIGHT breast. No suspicious mass, distortion, or microcalcifications are identified to suggest presence of malignancy.   IMPRESSION: 1. No mammographic evidence of malignancy in the RIGHT breast. Recommend return to annual screening mammography, due February 2024.   2. Breast pain is a common condition, which will often resolve on its own without intervention. It can be affected by hormonal changes, medication side effect, weight changes and fit of the bra. Pain may also be referred from other adjacent areas of the body. Breast pain may be improved by wearing adequate well-fitting support, over-the-counter topical and oral NSAID medication, low-fat diet, and ice/heat as needed. Studies have shown an improvement in cyclic pain with use of evening primrose oil and vitamin E. Clinical follow-up recommended to discuss any further work-up recommendations and appropriate treatment.   RECOMMENDATION: Annual screening mammography, due February 2024   I have discussed the findings and recommendations with the patient. If applicable, a reminder letter will be sent to the patient regarding the next appointment.   BI-RADS CATEGORY  2: Benign.     Electronically Signed   By: Valentino Saxon M.D.   On: 04/23/2022 10:47     Assessment:  71 y.o. yo Female with a problem list including...   Patient Active Problem List   Diagnosis Date Noted   Atypical ductal hyperplasia of right breast 09/19/2021   Obesity  with body mass index (BMI) of 30.0 to 39.9 01/01/2020   Cardiac syncope 05/18/2019   Hyperlipidemia, mixed 05/18/2019   Mixed stress and urge urinary incontinence 04/01/2018   Abnormal uterine bleeding (AUB) 05/15/2016   Right knee pain 07/26/2015   Allergic rhinitis, seasonal 12/09/2014   Menopausal symptoms 08/05/2014   HTN (hypertension) 11/18/2013   Anemia, unspecified 11/18/2013    presents to clinic for follow-up evaluation of ***, progressing well.  Plan:              - return to clinic ***as needed, instructed to call office if any questions or concerns  All of the above recommendations were discussed with the patient ***and patient's family, and all of patient's ***and family's questions were answered to ***his/her/their expressed satisfaction.  These notes generated with voice recognition software. I apologize for typographical errors.  Ronny Bacon, MD, FACS [place]: [facility] for exceptional care. Office: [phone]

## 2022-05-01 ENCOUNTER — Ambulatory Visit: Payer: PPO | Admitting: Surgery

## 2022-05-03 ENCOUNTER — Ambulatory Visit: Payer: PPO | Admitting: Surgery

## 2022-05-03 ENCOUNTER — Encounter: Payer: Self-pay | Admitting: Surgery

## 2022-05-03 ENCOUNTER — Other Ambulatory Visit: Payer: Self-pay

## 2022-05-03 VITALS — BP 176/73 | HR 78 | Temp 98.3°F | Ht 59.5 in | Wt 173.0 lb

## 2022-05-03 DIAGNOSIS — N6091 Unspecified benign mammary dysplasia of right breast: Secondary | ICD-10-CM

## 2022-05-03 NOTE — Progress Notes (Signed)
Surgical Clinic Progress/Follow-up Note   HPI:  71 y.o. Female presents to clinic for follow-up 6 months follow the last evaluation.  She had ADH, and excisional biopsy.  Patient reports some lingering tenderness to palpation of the area of her scar.  Made her aware of the good report of her most recent imaging.  She had declined endocrine therapy.  Review of Systems:  Constitutional: denies fever/chills  ENT: denies sore throat, hearing problems  Respiratory: denies shortness of breath, wheezing  Cardiovascular: denies chest pain, palpitations  Gastrointestinal: denies abdominal pain, N/V, or diarrhea/and bowel function as per interval history Skin: Denies any other rashes or skin discolorations as per interval history  Vital Signs:  BP (!) 176/73   Pulse 78   Temp 98.3 F (36.8 C) (Oral)   Ht 4' 11.5" (1.511 m)   Wt 173 lb (78.5 kg)   SpO2 98%   BMI 34.36 kg/m    Physical Exam:  Constitutional:  -- Norma body habitus  -- Awake, alert, and oriented x3  Pulmonary:  -- Breathing non-labored at rest Cardiovascular:  -- S1, S2 present  -- No pericardial rubs  Gastrointestinal:  -- Soft and non-distended, non-tender. Breast: Caryl Lyn present as chaperone.-- Post-surgical incisions all well-approximated without any peri-incisional thickening or mass effect -- No other dominant nor suspicious breast masses or nodularity present in either breast.  No evidence of skin changes, nipple skin inflammation.  No dimpling.  Musculoskeletal / Integumentary:  -- Wounds or skin discoloration: None appreciated -- Extremities: B/L UE and LE FROM, hands and feet warm, no edema   Imaging: CLINICAL DATA:  Status post stereotactic guided biopsy in February 2023 which demonstrated atypical ductal hyperplasia. Patient is status post excisional biopsy. Patient presents for short-term follow-up. Patient reports some RIGHT breast pain.   EXAM: DIGITAL DIAGNOSTIC UNILATERAL RIGHT MAMMOGRAM WITH  TOMOSYNTHESIS   TECHNIQUE: Right digital diagnostic mammography and breast tomosynthesis was performed.   COMPARISON:  Previous exam(s).   ACR Breast Density Category b: There are scattered areas of fibroglandular density.   FINDINGS: There is density and architectural distortion within the RIGHT breast, consistent with postsurgical changes. These are new in comparison to prior. No new suspicious calcifications are noted in the RIGHT breast. No suspicious mass, distortion, or microcalcifications are identified to suggest presence of malignancy.   IMPRESSION: 1. No mammographic evidence of malignancy in the RIGHT breast. Recommend return to annual screening mammography, due February 2024.   2. Breast pain is a common condition, which will often resolve on its own without intervention. It can be affected by hormonal changes, medication side effect, weight changes and fit of the bra. Pain may also be referred from other adjacent areas of the body. Breast pain may be improved by wearing adequate well-fitting support, over-the-counter topical and oral NSAID medication, low-fat diet, and ice/heat as needed. Studies have shown an improvement in cyclic pain with use of evening primrose oil and vitamin E. Clinical follow-up recommended to discuss any further work-up recommendations and appropriate treatment.   RECOMMENDATION: Annual screening mammography, due February 2024   I have discussed the findings and recommendations with the patient. If applicable, a reminder letter will be sent to the patient regarding the next appointment.   BI-RADS CATEGORY  2: Benign.     Electronically Signed   By: Valentino Saxon M.D.   On: 04/23/2022 10:47  Assessment:  71 y.o. yo Female with a problem list including...  Patient Active Problem List   Diagnosis Date  Noted   Atypical ductal hyperplasia of right breast 09/19/2021   Obesity with body mass index (BMI) of 30.0 to 39.9  01/01/2020   Cardiac syncope 05/18/2019   Hyperlipidemia, mixed 05/18/2019   Mixed stress and urge urinary incontinence 04/01/2018   Abnormal uterine bleeding (AUB) 05/15/2016   Right knee pain 07/26/2015   Allergic rhinitis, seasonal 12/09/2014   Menopausal symptoms 08/05/2014   HTN (hypertension) 11/18/2013   Anemia, unspecified 11/18/2013    presents to clinic for follow-up evaluation of right breast ADH, status post excision 6 months ago, progressing well.  Plan:              - return to clinic in February or as needed, instructed to call office if any questions or concerns Anticipating resumption of annual screening, with annual breast exam for history of ADH. All of the above recommendations were discussed with the patient, and all of patient's questions were answered to her expressed satisfaction.  These notes generated with voice recognition software. I apologize for typographical errors.  Campbell Lerner, MD, FACS [place]: [facility] for exceptional care. Office: [phone]

## 2022-05-03 NOTE — Patient Instructions (Signed)
Follow up here in February with a bilateral screening mammogram first then an office visit. We will send you a letter about these appointments.  Continue self breast exams. Call office for any new breast issues or concerns.   Breast Self-Awareness Breast self-awareness means being familiar with how your breasts look and feel. It involves checking your breasts regularly and telling your health care provider about any changes. Practicing breast self-awareness helps to maintain breast health. Sometimes, changes are not harmful (are benign). Other times, a change in your breasts can be a sign of a serious medical problem. Being familiar with the look and feel of your breasts can help you catch a breast problem while it is still small and can be treated. You should do breast self-exams even if you have breast implants. What you need: A mirror. A well-lit room. A pillow or other soft object. How to do a breast self-exam A breast self-exam is one way to learn what is normal for your breasts and whether your breasts are changing. To do a breast self-exam: Look for changes  Remove all the clothing above your waist. Stand in front of a mirror in a room with good lighting. Put your hands down at your sides. Compare your breasts in the mirror. Look for differences between them (asymmetry), such as: Differences in shape. Differences in size. Puckers, dips, and bumps in one breast and not the other. Look at each breast for changes in the skin, such as: Redness. Scaly areas. Skin thickening. Dimpling. Open sores (ulcers). Look for changes in your nipples, such as: Discharge. Bleeding. Dimpling. Redness. A nipple that looks pushed in (retracted), or that has changed position. Feel for changes Carefully feel your breasts for lumps and changes. It is best to do this self-exam while lying down. Follow these steps to feel each breast: Place a pillow under the shoulder of one side of your  body. Place the arm of that side of your body behind your head. Feel the breast of that side of your body using the hand of the opposite arm. To do this: Start in the nipple area and use the pads of your three middle fingers to make -inch (2 cm) overlapping circles. Use light, medium, and then firm pressure as you feel your breast, gently covering the entire breast area and armpit. Continue the overlapping circles, moving downward over the breast until you feel your ribs below your breast. Then, make circles with your fingers going upward until you reach your collarbone. Next, make circles by moving outward across your breast and into your armpit area. Squeeze the nipple. Check for discharge and lumps. Repeat steps 1-7 to check your other breast. Sit or stand in the tub or shower. With soapy water on your skin, feel each breast the same way you did when you were lying down. Write down what you find Writing down what you find can help you remember what to discuss with your health care provider. Write down: What is normal for each breast. Any changes that you find in each breast. These include: The kind of changes you find. Any pain or tenderness. Size and location of any lumps. Where you are in your menstrual cycle, if you are still getting your menstrual period (menstruating). General tips If you are breastfeeding, the best time to examine your breasts is after a feeding or after using a breast pump. If you menstruate, the best time to examine your breasts is 5-7 days after your menstrual period. Breasts  are generally lumpier during menstrual periods, and it may be more difficult to notice changes. With time and practice, you will become more familiar with the differences in your breasts and more comfortable with the exam. Contact a health care provider if: You see a change in the shape or size of your breasts or nipples. You see a change in the skin of your breast or nipples, such as a  reddened or scaly area. You have unusual discharge from your nipples. You find a new lump or thick area. You have breast pain. You have any concerns about your breast health. Summary Breast self-awareness includes looking for physical changes in your breasts and feeling for any changes within your breasts. Breast self-awareness should be done in front of a mirror in a well-lit room. If you menstruate, the best time to examine your breasts is 5-7 days after your menstrual period. Tell your health care provider about any changes you notice in your breasts. Changes include changes in size, changes on the skin, pain or tenderness, or unusual fluid from your nipples. This information is not intended to replace advice given to you by your health care provider. Make sure you discuss any questions you have with your health care provider. Document Revised: 05/30/2021 Document Reviewed: 05/11/2021 Elsevier Patient Education  [place].

## 2022-06-05 DIAGNOSIS — H353131 Nonexudative age-related macular degeneration, bilateral, early dry stage: Secondary | ICD-10-CM | POA: Diagnosis not present

## 2022-06-12 ENCOUNTER — Encounter: Payer: Self-pay | Admitting: Family Medicine

## 2022-06-12 ENCOUNTER — Ambulatory Visit (INDEPENDENT_AMBULATORY_CARE_PROVIDER_SITE_OTHER): Payer: PPO | Admitting: Family Medicine

## 2022-06-12 VITALS — BP 146/68 | HR 87 | Ht 59.5 in | Wt 172.0 lb

## 2022-06-12 DIAGNOSIS — I10 Essential (primary) hypertension: Secondary | ICD-10-CM

## 2022-06-12 DIAGNOSIS — E782 Mixed hyperlipidemia: Secondary | ICD-10-CM | POA: Diagnosis not present

## 2022-06-12 DIAGNOSIS — Z23 Encounter for immunization: Secondary | ICD-10-CM | POA: Diagnosis not present

## 2022-06-12 DIAGNOSIS — J301 Allergic rhinitis due to pollen: Secondary | ICD-10-CM

## 2022-06-12 MED ORDER — MONTELUKAST SODIUM 10 MG PO TABS: 10.0000 mg | ORAL_TABLET | Freq: Every day | ORAL | 3 refills | Status: DC

## 2022-06-12 MED ORDER — CARVEDILOL 3.125 MG PO TABS: 3.1250 mg | ORAL_TABLET | Freq: Two times a day (BID) | ORAL | 3 refills | Status: DC

## 2022-06-12 NOTE — Progress Notes (Signed)
Date:  06/12/2022   Name:  Abigail Malone   DOB:  January 28, 1951   MRN:  [id number]   Chief Complaint: Allergic Rhinitis , Hypertension, and pneumonia vaccine  Hypertension This is a chronic problem. The current episode started more than 1 year ago. The problem has been waxing and waning since onset. The problem is uncontrolled. Pertinent negatives include no blurred vision, chest pain, headaches, orthopnea, palpitations, peripheral edema, PND or shortness of breath. There are no associated agents to hypertension. 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 The current treatment provides mild improvement. There are no compliance problems.  There is no history of angina, kidney disease, CAD/MI, CVA, heart failure, left ventricular hypertrophy, PVD or retinopathy.    Lab Results  Component Value Date   NA 143 12/12/2021   K 3.5 12/12/2021   CO2 27 12/12/2021   GLUCOSE 93 12/12/2021   BUN 9 12/12/2021   CREATININE 0.66 12/12/2021   CALCIUM 9.8 12/12/2021   EGFR 94 12/12/2021   GFRNONAA >60 10/12/2021   Lab Results  Component Value Date   CHOL 223 (H) 05/16/2021   HDL 56 05/16/2021   LDLCALC 142 (H) 05/16/2021   TRIG 140 05/16/2021   No results found for: "TSH" No results found for: "HGBA1C" Lab Results  Component Value Date   WBC 7.6 10/12/2021   HGB 11.3 (L) 10/12/2021   HCT 34.9 (L) 10/12/2021   MCV 80.4 10/12/2021   PLT 257 10/12/2021   Lab Results  Component Value Date   ALT 12 10/12/2021   AST 18 10/12/2021   ALKPHOS 71 10/12/2021   BILITOT 1.0 10/12/2021   No results found for: "25OHVITD2", "25OHVITD3", "VD25OH"   Review of Systems  Constitutional: Negative.  Negative for chills, fatigue, fever and unexpected weight change.  HENT:  Positive for postnasal drip and sinus pressure. Negative for congestion, ear discharge, ear pain, rhinorrhea, sneezing and sore throat.   Eyes:  Negative for blurred vision.  Respiratory:  Negative for cough, shortness of breath,  wheezing and stridor.   Cardiovascular:  Negative for chest pain, palpitations, orthopnea and PND.  Gastrointestinal:  Negative for abdominal pain and nausea.  Genitourinary:  Negative for difficulty urinating, dysuria and frequency.  Musculoskeletal:  Negative for arthralgias, back pain and myalgias.  Skin:  Negative for rash.  Neurological:  Negative for dizziness, weakness and headaches.  Hematological:  Negative for adenopathy. Does not bruise/bleed easily.  Psychiatric/Behavioral:  Negative for dysphoric mood. The patient is not nervous/anxious.     Patient Active Problem List   Diagnosis Date Noted   Atypical ductal hyperplasia of right breast 09/19/2021   Obesity with body mass index (BMI) of 30.0 to 39.9 01/01/2020   Cardiac syncope 05/18/2019   Hyperlipidemia, mixed 05/18/2019   Mixed stress and urge urinary incontinence 04/01/2018   Abnormal uterine bleeding (AUB) 05/15/2016   Right knee pain 07/26/2015   Allergic rhinitis, seasonal 12/09/2014   Menopausal symptoms 08/05/2014   HTN (hypertension) 11/18/2013   Anemia, unspecified 11/18/2013    Allergies  Allergen Reactions   Amlodipine     Leg cramping    Past Surgical History:  Procedure Laterality Date   BREAST BIOPSY Right 09/07/2021   stereo calcs, ribbon clip, path pending.   BREAST BIOPSY WITH RADIO FREQUENCY LOCALIZER Right 10/18/2021   Procedure: BREAST BIOPSY WITH RADIO FREQUENCY LOCALIZER;  Surgeon: Ronny Bacon, MD;  Location: [facility] ORS;  Service: General;  Laterality: Right;   COLONOSCOPY  ~2018    Social History  Tobacco Use   Smoking status: Former    Packs/day: 0.50    Years: 8.00    Total pack years: 4.00    Types: Cigarettes    Quit date: 12/22/2006    Years since quitting: 15.4    Passive exposure: Never   Smokeless tobacco: Never  Vaping Use   Vaping Use: Never used  Substance Use Topics   Alcohol use: Not Currently   Drug use: No     Medication list has been reviewed and  updated.  Current Meds  Medication Sig   acetaminophen (TYLENOL) 500 MG tablet Take 1,000 mg by mouth every 6 (six) hours as needed for moderate pain.   Cholecalciferol (VITAMIN D) 50 MCG (2000 UT) tablet Take 2,000 Units by mouth daily.   Coenzyme Q10 200 MG capsule Take 200 mg by mouth daily.   conjugated estrogens (PREMARIN) vaginal cream Estrogen Cream Instruction Discard applicator Apply pea sized amount to tip of finger to urethra before bed. Wash hands well after application. Use Monday, Wednesday and Friday   ibuprofen (ADVIL) 800 MG tablet TAKE 1 TABLET(800 MG) BY MOUTH EVERY 8 HOURS AS NEEDED   montelukast (SINGULAIR) 10 MG tablet Take 1 tablet (10 mg total) by mouth at bedtime.   nystatin cream (MYCOSTATIN) Apply 1 application. topically 2 (two) times daily.   Omega-3 Fatty Acids (FISH OIL) 1200 MG CAPS Take 1,200 mg by mouth daily.   oxybutynin (DITROPAN-XL) 10 MG 24 hr tablet Take 10 mg by mouth daily.   [DISCONTINUED] lisinopril-hydrochlorothiazide (ZESTORETIC) 10-12.5 MG tablet Take 1 tablet by mouth daily.       06/12/2022    9:06 AM 12/12/2021    9:23 AM 11/14/2021   10:20 AM 06/05/2021   11:21 AM  GAD 7 : Generalized Anxiety Score  Nervous, Anxious, on Edge 0 0 0 0  Control/stop worrying 0 0 0 0  Worry too much - different things 0 0 1 0  Trouble relaxing 0 0 1 0  Restless 0 0 0 0  Easily annoyed or irritable 0 0 0 0  Afraid - awful might happen 0 0 0 0  Total GAD 7 Score 0 0 2 0  Anxiety Difficulty Not difficult at all Not difficult at all Not difficult at all Not difficult at all       06/12/2022    9:06 AM 02/14/2022    9:04 AM 02/14/2022    8:59 AM  Depression screen PHQ 2/9  Decreased Interest 0 0 0  Down, Depressed, Hopeless 0 0 0  PHQ - 2 Score 0 0 0  Altered sleeping 0    Tired, decreased energy 0    Change in appetite 0    Feeling bad or failure about yourself  0    Trouble concentrating 0    Moving slowly or fidgety/restless 0    Suicidal  thoughts 0    PHQ-9 Score 0    Difficult doing work/chores Not difficult at all      BP Readings from Last 3 Encounters:  06/12/22 (!) 146/68  05/03/22 (!) 176/73  12/12/21 120/80    Physical Exam Vitals and nursing note reviewed. Exam conducted with a chaperone present.  Constitutional:      General: She is not in acute distress.    Appearance: She is not diaphoretic.  HENT:     Head: Normocephalic and atraumatic.     Right Ear: External ear normal.     Left Ear: External ear normal.  Nose: Nose normal.  Eyes:     General:        Right eye: No discharge.        Left eye: No discharge.     Conjunctiva/sclera: Conjunctivae normal.     Pupils: Pupils are equal, round, and reactive to light.  Neck:     Thyroid: No thyromegaly.     Vascular: No JVD.  Cardiovascular:     Rate and Rhythm: Normal rate and regular rhythm.     Heart sounds: Normal heart sounds. No murmur heard.    No friction rub. No gallop.  Pulmonary:     Effort: Pulmonary effort is normal.     Breath sounds: Normal breath sounds. No wheezing, rhonchi or rales.  Abdominal:     General: Bowel sounds are normal.     Palpations: Abdomen is soft. There is no mass.     Tenderness: There is no abdominal tenderness. There is no guarding or rebound.  Musculoskeletal:        General: Normal range of motion.     Cervical back: Normal range of motion and neck supple.  Lymphadenopathy:     Cervical: No cervical adenopathy.  Skin:    General: Skin is warm and dry.  Neurological:     Mental Status: She is alert.     Deep Tendon Reflexes: Reflexes are normal and symmetric.     Wt Readings from Last 3 Encounters:  06/12/22 172 lb (78 kg)  05/03/22 173 lb (78.5 kg)  12/12/21 170 lb (77.1 kg)    BP (!) 146/68 (BP Location: Left Arm, Cuff Size: Large)   Pulse 87   Ht 4' 11.5" (1.511 m)   Wt 172 lb (78 kg)   SpO2 98%   BMI 34.16 kg/m   Assessment and Plan:  1. Primary hypertension Chronic.   Uncontrolled.  Stable.  146/68.  Asymptomatic.  We will make a change on the antihypertensive regimen and discontinue the lisinopril hydrochlorothiazide.  We will increase that diuretic because patient has urinary incontinence already which has been suggested that she return to urology.  Patient is unable to tolerate amlodipine in the past we will instead initiate carvedilol 3.1251 twice a day and recheck in 4 weeks. - Renal Function Panel - carvedilol (COREG) 3.125 MG tablet; Take 1 tablet (3.125 mg total) by mouth 2 (two) times daily with a meal.  Dispense: 60 tablet; Refill: 3  2. Seasonal allergic rhinitis due to pollen Chronic.  Controlled.  Stable.  Continue Singulair 10 mg nightly. - montelukast (SINGULAIR) 10 MG tablet; Take 1 tablet (10 mg total) by mouth at bedtime.  Dispense: 30 tablet; Refill: 3  3. Need for pneumococcal vaccination Discussed and administered. - Pneumococcal conjugate vaccine 20-valent (Prevnar 20)  4. Hyperlipidemia, mixed Currently diet controlled and we will reemphasize low-cholesterol dietary intake as well as a Dash diet. - Lipid Panel With LDL/HDL Ratio    Otilio Miu, MD

## 2022-06-12 NOTE — Progress Notes (Signed)
06/13/2022 2:32 PM   Abigail Malone 08-30-1950 [id number]  Referring provider: Duanne Limerick, MD [address] [place],  [place] [address]  Urological history: 1. Mixed urinary incontinence -risk factors of obesity, vaginal delivery, a family history of incontinence, age, caffeine and vaginal atrophy   -completed PT -failed OAB agents -failed PTNS -vaginal estrogen cream  -PVR 150 mL  -Currently on oxybutynin XL 10 mg daily   HPI: Abigail Malone is 71 y.o. female who presents today for weak bladder.   I saw her last in September 2022 after she had stated she found no improvement in her mixed incontinence with OAB medications or PTNS.  At time I recommended undergoing a cystoscopy, but she canceled the appointment.  I also offered pelvic floor physical therapy to her, but her insurance would not cover physical therapy.  She is having 1-1 daytime urinations, 1 nighttime urination with a strong urge to urinate.  She is having symptoms of stress incontinence.  She leaks 1-2 times weekly.  She wears 1-2 absorbent pads daily.  She does engage in toilet mapping.  Patient denies any modifying or aggravating factors.  Patient denies any gross hematuria, dysuria or suprapubic/flank pain.  Patient denies any fevers, chills, nausea or vomiting.    She is taking the oxybutynin XL 10 mg daily and applying the vaginal estrogen cream 3 nights weekly.  She feels that the stress is mild in comparison to her urge incontinence, but when you press her more she also leaks urine when she goes walking for exercise.  She states more recently she has been having the feeling of having to use restroom and starting to have urine run down her leg before she can get to the toilet.  Urinalysis yellow clear, specific gravity less than 1.005, pH 6.0, leukocyte +1, 6-10 WBCs, 0-2 RBCs, 0-10 epithelial cells and moderate bacteria.  She finds wearing the incontinence pads very irritating to her perineum  when she is engaged in exercise.  PVR 150 mL   PMH: Past Medical History:  Diagnosis Date   Abnormal uterine bleeding (AUB) 05/15/2016   Allergic rhinitis, seasonal 12/09/2014   Anemia, unspecified 11/18/2013   HTN (hypertension) 11/18/2013   Hypertension    Menopausal symptoms 08/05/2014   Right knee pain 07/26/2015    Surgical History: Past Surgical History:  Procedure Laterality Date   BREAST BIOPSY Right 09/07/2021   stereo calcs, ribbon clip, path pending.   BREAST BIOPSY WITH RADIO FREQUENCY LOCALIZER Right 10/18/2021   Procedure: BREAST BIOPSY WITH RADIO FREQUENCY LOCALIZER;  Surgeon: Campbell Lerner, MD;  Location: [facility] ORS;  Service: General;  Laterality: Right;   COLONOSCOPY  ~2018    Home Medications:  Allergies as of 06/13/2022       Reactions   Amlodipine    Leg cramping        Medication List        Accurate as of June 13, 2022  2:32 PM. If you have any questions, ask your nurse or doctor.          acetaminophen 500 MG tablet Commonly known as: TYLENOL Take 1,000 mg by mouth every 6 (six) hours as needed for moderate pain.   carvedilol 3.125 MG tablet Commonly known as: COREG Take 1 tablet (3.125 mg total) by mouth 2 (two) times daily with a meal.   Coenzyme Q10 200 MG capsule Take 200 mg by mouth daily.   Fish Oil 1200 MG Caps Take 1,200 mg by mouth  daily.   ibuprofen 800 MG tablet Commonly known as: ADVIL TAKE 1 TABLET(800 MG) BY MOUTH EVERY 8 HOURS AS NEEDED   montelukast 10 MG tablet Commonly known as: SINGULAIR Take 1 tablet (10 mg total) by mouth at bedtime.   nystatin cream Commonly known as: MYCOSTATIN Apply 1 application. topically 2 (two) times daily.   oxybutynin 10 MG 24 hr tablet Commonly known as: DITROPAN-XL Take 1 tablet (10 mg total) by mouth daily.   Premarin vaginal cream Generic drug: conjugated estrogens Estrogen Cream Instruction Discard applicator Apply pea sized amount to tip of finger to urethra before  bed. Wash hands well after application. Use Monday, Wednesday and Friday   Vitamin D 50 MCG (2000 UT) tablet Take 2,000 Units by mouth daily.        Allergies:  Allergies  Allergen Reactions   Amlodipine     Leg cramping    Family History: Family History  Problem Relation Age of Onset   Cancer Mother        Liver   Stroke Mother    Hypertension Mother    Hematuria Mother    Liver cancer Mother    Cancer Father    Prostate cancer Father    Kidney failure Brother    Breast cancer Neg Hx     Social History:  reports that she quit smoking about 15 years ago. Her smoking use included cigarettes. She has a 4.00 pack-year smoking history. She has never been exposed to tobacco smoke. She has never used smokeless tobacco. She reports that she does not currently use alcohol. She reports that she does not use drugs.  ROS For pertinent review of systems please refer to history of present illness  Physical Exam: BP (!) 172/89   Pulse 71   Ht 4\' 11"  (1.499 m)   Wt 172 lb (78 kg)   BMI 34.74 kg/m   Constitutional:  Well nourished. Alert and oriented, No acute distress. HEENT:  AT, moist mucus membranes.  Trachea midline Cardiovascular: No clubbing, cyanosis, or edema. Respiratory: Normal respiratory effort, no increased work of breathing. GU: No CVA tenderness.  No bladder fullness or masses. Vulvovaginal atrophy w/ pallor, loss of rugae, excoriations, vulvar thinning, fusion of labia, clitoral hood retraction, prominent urethral meatus.  Vitiligo is noted in the posterior labia and the gluteal folds .  No bladder fullness, tenderness or masses. Pale vagina mucosa, fair estrogen effect, no discharge, no lesions, fair pelvic support, grade II cystocele w/ irritated band of erythema she underneath the urethra and grade I rectocele noted.  Anus and perineum are without rashes or lesions.     Neurologic: Grossly intact, no focal deficits, moving all 4 extremities. Psychiatric: Normal  mood and affect.    Laboratory Data: No labs since last visit  Pertinent Imaging  06/13/22 13:45  Scan Result 06/15/22     Assessment & Plan:    1. Mixed incontinence -She continues to fail OAB agents and did not find PTNS effective -Since medication and PTNS have failed to get her to goal regarding her incontinence, I would like for her to see Dr. for further evaluation and treatment -I have given her samples of Gemtesa 75 mg daily in the interim and she will discontinue the oxybutynin  2. Vaginal atrophy -Continue the vaginal estrogen cream 3 nights weekly  3. Cystocele -insurance would not cover PT  4. Rectocele -see #3  Return for appointment with Dr. Sherron Monday .   Abigail Luppino, PA-C  [facility] [address] [place], [place] [address] [phone]

## 2022-06-12 NOTE — Patient Instructions (Signed)

## 2022-06-13 ENCOUNTER — Encounter: Payer: Self-pay | Admitting: Urology

## 2022-06-13 ENCOUNTER — Other Ambulatory Visit: Payer: Self-pay

## 2022-06-13 ENCOUNTER — Ambulatory Visit: Payer: PPO | Admitting: Urology

## 2022-06-13 VITALS — BP 172/89 | HR 71 | Ht 59.0 in | Wt 172.0 lb

## 2022-06-13 DIAGNOSIS — N8111 Cystocele, midline: Secondary | ICD-10-CM | POA: Diagnosis not present

## 2022-06-13 DIAGNOSIS — N3946 Mixed incontinence: Secondary | ICD-10-CM

## 2022-06-13 DIAGNOSIS — N952 Postmenopausal atrophic vaginitis: Secondary | ICD-10-CM | POA: Diagnosis not present

## 2022-06-13 DIAGNOSIS — N816 Rectocele: Secondary | ICD-10-CM

## 2022-06-13 LAB — URINALYSIS, COMPLETE
Bilirubin, UA: NEGATIVE
Glucose, UA: NEGATIVE
Ketones, UA: NEGATIVE
Nitrite, UA: NEGATIVE
Protein,UA: NEGATIVE
RBC, UA: NEGATIVE
Specific Gravity, UA: <1.005 — ABNORMAL LOW (ref 1.005–1.030)
Urobilinogen, Ur: 0.2 mg/dL (ref 0.2–1.0)
pH, UA: 6 (ref 5.0–7.5)

## 2022-06-13 LAB — RENAL FUNCTION PANEL
Albumin: 4.4 g/dL (ref 3.8–4.8)
BUN/Creatinine Ratio: 11 — ABNORMAL LOW (ref 12–28)
BUN: 7 mg/dL — ABNORMAL LOW (ref 8–27)
CO2: 25 mmol/L (ref 20–29)
Calcium: 9.9 mg/dL (ref 8.7–10.3)
Chloride: 101 mmol/L (ref 96–106)
Creatinine, Ser: 0.62 mg/dL (ref 0.57–1.00)
Glucose: 92 mg/dL (ref 70–99)
Phosphorus: 3.3 mg/dL (ref 3.0–4.3)
Potassium: 3.5 mmol/L (ref 3.5–5.2)
Sodium: 141 mmol/L (ref 134–144)
eGFR: 95 mL/min/{1.73_m2} (ref >59)

## 2022-06-13 LAB — LIPID PANEL WITH LDL/HDL RATIO
Cholesterol, Total: 202 mg/dL — ABNORMAL HIGH (ref 100–199)
HDL: 55 mg/dL (ref >39)
LDL Chol Calc (NIH): 124 mg/dL — ABNORMAL HIGH (ref 0–99)
LDL/HDL Ratio: 2.3 ratio (ref 0.0–3.2)
Triglycerides: 127 mg/dL (ref 0–149)
VLDL Cholesterol Cal: 23 mg/dL (ref 5–40)

## 2022-06-13 LAB — MICROSCOPIC EXAMINATION

## 2022-06-13 LAB — BLADDER SCAN AMB NON-IMAGING

## 2022-06-13 MED ORDER — PREMARIN 0.625 MG/GM VA CREA: TOPICAL_CREAM | VAGINAL | 12 refills | Status: DC

## 2022-06-13 MED ORDER — OXYBUTYNIN CHLORIDE ER 10 MG PO TB24: 10.0000 mg | ORAL_TABLET | Freq: Every day | ORAL | 3 refills | Status: DC

## 2022-07-10 ENCOUNTER — Ambulatory Visit (INDEPENDENT_AMBULATORY_CARE_PROVIDER_SITE_OTHER): Payer: PPO | Admitting: Family Medicine

## 2022-07-10 ENCOUNTER — Encounter: Payer: Self-pay | Admitting: Family Medicine

## 2022-07-10 VITALS — BP 128/78 | HR 70 | Ht 59.0 in | Wt 175.0 lb

## 2022-07-10 DIAGNOSIS — I10 Essential (primary) hypertension: Secondary | ICD-10-CM

## 2022-07-10 NOTE — Progress Notes (Signed)
Date:  07/10/2022   Name:  Abigail Malone   DOB:  [birth year]   MRN:  [id number]   Chief Complaint: Hypertension (Blood pressure was elevated at dentist's office- used a wrist cuff)  Hypertension This is a chronic problem. The current episode started more than 1 year ago. The problem has been waxing and waning since onset. The problem is controlled. Pertinent negatives include no chest pain, headaches, palpitations, PND or shortness of breath. The current treatment provides moderate improvement.    Lab Results  Component Value Date   NA 141 06/12/2022   K 3.5 06/12/2022   CO2 25 06/12/2022   GLUCOSE 92 06/12/2022   BUN 7 (L) 06/12/2022   CREATININE 0.62 06/12/2022   CALCIUM 9.9 06/12/2022   EGFR 95 06/12/2022   GFRNONAA >60 10/12/2021   Lab Results  Component Value Date   CHOL 202 (H) 06/12/2022   HDL 55 06/12/2022   LDLCALC 124 (H) 06/12/2022   TRIG 127 06/12/2022   No results found for: "TSH" No results found for: "HGBA1C" Lab Results  Component Value Date   WBC 7.6 10/12/2021   HGB 11.3 (L) 10/12/2021   HCT 34.9 (L) 10/12/2021   MCV 80.4 10/12/2021   PLT 257 10/12/2021   Lab Results  Component Value Date   ALT 12 10/12/2021   AST 18 10/12/2021   ALKPHOS 71 10/12/2021   BILITOT 1.0 10/12/2021   No results found for: "25OHVITD2", "25OHVITD3", "VD25OH"   Review of Systems  Constitutional:  Negative for unexpected weight change.  Respiratory:  Negative for chest tightness and shortness of breath.   Cardiovascular:  Negative for chest pain, palpitations and PND.  Neurological:  Negative for dizziness, syncope, light-headedness and headaches.    Patient Active Problem List   Diagnosis Date Noted   Atypical ductal hyperplasia of right breast 09/19/2021   Obesity with body mass index (BMI) of 30.0 to 39.9 01/01/2020   Cardiac syncope 05/18/2019   Hyperlipidemia, mixed 05/18/2019   Mixed stress and urge urinary incontinence 04/01/2018   Abnormal uterine  bleeding (AUB) 05/15/2016   Right knee pain 07/26/2015   Allergic rhinitis, seasonal 12/09/2014   Menopausal symptoms 08/05/2014   HTN (hypertension) 11/18/2013   Anemia, unspecified 11/18/2013    Allergies  Allergen Reactions   Amlodipine     Leg cramping    Past Surgical History:  Procedure Laterality Date   BREAST BIOPSY Right 09/07/2021   stereo calcs, ribbon clip, path pending.   BREAST BIOPSY WITH RADIO FREQUENCY LOCALIZER Right 10/18/2021   Procedure: BREAST BIOPSY WITH RADIO FREQUENCY LOCALIZER;  Surgeon: Ronny Bacon, MD;  Location: [facility] ORS;  Service: General;  Laterality: Right;   COLONOSCOPY  ~2018    Social History   Tobacco Use   Smoking status: Former    Packs/day: 0.50    Years: 8.00    Total pack years: 4.00    Types: Cigarettes    Quit date: 12/22/2006    Years since quitting: 15.5    Passive exposure: Never   Smokeless tobacco: Never  Vaping Use   Vaping Use: Never used  Substance Use Topics   Alcohol use: Not Currently   Drug use: No     Medication list has been reviewed and updated.  Current Meds  Medication Sig   acetaminophen (TYLENOL) 500 MG tablet Take 1,000 mg by mouth every 6 (six) hours as needed for moderate pain.   carvedilol (COREG) 3.125 MG tablet Take 1 tablet (3.125 mg total)  by mouth 2 (two) times daily with a meal.   Cholecalciferol (VITAMIN D) 50 MCG (2000 UT) tablet Take 2,000 Units by mouth daily.   Coenzyme Q10 200 MG capsule Take 200 mg by mouth daily.   conjugated estrogens (PREMARIN) vaginal cream Estrogen Cream Instruction Discard applicator Apply pea sized amount to tip of finger to urethra before bed. Wash hands well after application. Use Monday, Wednesday and Friday   ibuprofen (ADVIL) 800 MG tablet TAKE 1 TABLET(800 MG) BY MOUTH EVERY 8 HOURS AS NEEDED   montelukast (SINGULAIR) 10 MG tablet Take 1 tablet (10 mg total) by mouth at bedtime.   nystatin cream (MYCOSTATIN) Apply 1 application. topically 2 (two) times  daily.   Omega-3 Fatty Acids (FISH OIL) 1200 MG CAPS Take 1,200 mg by mouth daily.   Vibegron (GEMTESA) 75 MG TABS Take 1 tablet by mouth daily. urology   [DISCONTINUED] oxybutynin (DITROPAN-XL) 10 MG 24 hr tablet Take 1 tablet (10 mg total) by mouth daily.       06/12/2022    9:06 AM 12/12/2021    9:23 AM 11/14/2021   10:20 AM 06/05/2021   11:21 AM  GAD 7 : Generalized Anxiety Score  Nervous, Anxious, on Edge 0 0 0 0  Control/stop worrying 0 0 0 0  Worry too much - different things 0 0 1 0  Trouble relaxing 0 0 1 0  Restless 0 0 0 0  Easily annoyed or irritable 0 0 0 0  Afraid - awful might happen 0 0 0 0  Total GAD 7 Score 0 0 2 0  Anxiety Difficulty Not difficult at all Not difficult at all Not difficult at all Not difficult at all       06/12/2022    9:06 AM 02/14/2022    9:04 AM 02/14/2022    8:59 AM  Depression screen PHQ 2/9  Decreased Interest 0 0 0  Down, Depressed, Hopeless 0 0 0  PHQ - 2 Score 0 0 0  Altered sleeping 0    Tired, decreased energy 0    Change in appetite 0    Feeling bad or failure about yourself  0    Trouble concentrating 0    Moving slowly or fidgety/restless 0    Suicidal thoughts 0    PHQ-9 Score 0    Difficult doing work/chores Not difficult at all      BP Readings from Last 3 Encounters:  07/10/22 128/78  06/13/22 (!) 172/89  06/12/22 (!) 146/68    Physical Exam Vitals and nursing note reviewed.  HENT:     Right Ear: Tympanic membrane and ear canal normal.     Left Ear: Tympanic membrane and ear canal normal.     Mouth/Throat:     Mouth: Mucous membranes are moist.  Eyes:     Pupils: Pupils are equal, round, and reactive to light.  Cardiovascular:     Rate and Rhythm: Normal rate.     Heart sounds: No murmur heard.    No friction rub. No gallop.  Pulmonary:     Effort: No respiratory distress.     Breath sounds: No wheezing, rhonchi or rales.     Wt Readings from Last 3 Encounters:  07/10/22 175 lb (79.4 kg)  06/13/22  172 lb (78 kg)  06/12/22 172 lb (78 kg)    BP 128/78   Pulse 70   Ht _0  (1.499 m)   Wt 175 lb (79.4 kg)   SpO2 99%  BMI 35.35 kg/m   Assessment and Plan:  1. Primary hypertension Chronic.  Controlled.  Stable.  Blood pressure today reading 128/78.  Patient admits that when she had a blood pressure taken at the dental office she did not have medication for at least 2 hours prior in her system that in fact she had only just taking it right before she went to the dentist office and initially was a wrist reading which then had an arm reading but the arm was not in a resting position and feet were not flat on the floor and an uncrossed.  Patient will continue current dosing of carvedilol 3.125 mg twice a day as well as curtailing any additional sodium  intake.   Otilio Miu, MD

## 2022-07-10 NOTE — Patient Instructions (Signed)

## 2022-07-24 ENCOUNTER — Other Ambulatory Visit: Payer: Self-pay

## 2022-07-24 DIAGNOSIS — Z1231 Encounter for screening mammogram for malignant neoplasm of breast: Secondary | ICD-10-CM

## 2022-08-06 ENCOUNTER — Ambulatory Visit: Payer: PPO | Admitting: Urology

## 2022-08-06 ENCOUNTER — Encounter: Payer: Self-pay | Admitting: Urology

## 2022-08-06 VITALS — Ht 59.5 in | Wt 173.0 lb

## 2022-08-06 DIAGNOSIS — N3946 Mixed incontinence: Secondary | ICD-10-CM

## 2022-08-06 LAB — MICROSCOPIC EXAMINATION

## 2022-08-06 LAB — URINALYSIS, COMPLETE
Bilirubin, UA: NEGATIVE
Glucose, UA: NEGATIVE
Ketones, UA: NEGATIVE
Leukocytes,UA: NEGATIVE
Nitrite, UA: NEGATIVE
Protein,UA: NEGATIVE
RBC, UA: NEGATIVE
Specific Gravity, UA: 1.015 (ref 1.005–1.030)
Urobilinogen, Ur: 0.2 mg/dL (ref 0.2–1.0)
pH, UA: 7 (ref 5.0–7.5)

## 2022-08-06 MED ORDER — GEMTESA 75 MG PO TABS: 1.0000 | ORAL_TABLET | Freq: Every day | ORAL | 3 refills | Status: DC

## 2022-08-06 NOTE — Progress Notes (Signed)
08/06/2022 10:43 AM   Abigail Malone 18-Nov-1950 [id number]  Referring provider: Juline Patch, MD [address] [place],  [place] [address]  Chief Complaint  Patient presents with   Follow-up   Urinary Incontinence    HPI: Abigail Malone: Mixed urinary incontinence who failed oxybutynin percutaneous tibial nerve stimulation vaginal estrogen cream and did not want to do physical therapy.  Reported urge incontinence worse and stress incontinence.  Last postvoid residual was 150 mL.  Was given Gemtesa.  Has failed Myrbetriq  Today Prior to going on Gemtesa urge component was much more severe than stress incontinence.  She would wear 2-3 pads a day especially if she went out in public.  She is almost completely dry on Gemtesa and not even having stress incontinence.  She is very pleased.  She has never had bedwetting.  No infection history.  Medically not infected.  Frequency stable   PMH: Past Medical History:  Diagnosis Date   Abnormal uterine bleeding (AUB) 05/15/2016   Allergic rhinitis, seasonal 12/09/2014   Anemia, unspecified 11/18/2013   HTN (hypertension) 11/18/2013   Hypertension    Menopausal symptoms 08/05/2014   Right knee pain 07/26/2015    Surgical History: Past Surgical History:  Procedure Laterality Date   BREAST BIOPSY Right 09/07/2021   stereo calcs, ribbon clip, path pending.   BREAST BIOPSY WITH RADIO FREQUENCY LOCALIZER Right 10/18/2021   Procedure: BREAST BIOPSY WITH RADIO FREQUENCY LOCALIZER;  Surgeon: Ronny Bacon, MD;  Location: [facility] ORS;  Service: General;  Laterality: Right;   COLONOSCOPY  ~2018    Home Medications:  Allergies as of 08/06/2022       Reactions   Amlodipine    Leg cramping        Medication List        Accurate as of August 06, 2022 10:43 AM. If you have any questions, ask your nurse or doctor.          acetaminophen 500 MG tablet Commonly known as: TYLENOL Take 1,000 mg by mouth every 6 (six) hours as  needed for moderate pain.   carvedilol 3.125 MG tablet Commonly known as: COREG Take 1 tablet (3.125 mg total) by mouth 2 (two) times daily with a meal.   Coenzyme Q10 200 MG capsule Take 200 mg by mouth daily.   Fish Oil 1200 MG Caps Take 1,200 mg by mouth daily.   Gemtesa 75 MG Tabs Generic drug: Vibegron Take 1 tablet by mouth daily. urology   ibuprofen 800 MG tablet Commonly known as: ADVIL TAKE 1 TABLET(800 MG) BY MOUTH EVERY 8 HOURS AS NEEDED   montelukast 10 MG tablet Commonly known as: SINGULAIR Take 1 tablet (10 mg total) by mouth at bedtime.   nystatin cream Commonly known as: MYCOSTATIN Apply 1 application. topically 2 (two) times daily.   Premarin vaginal cream Generic drug: conjugated estrogens Estrogen Cream Instruction Discard applicator Apply pea sized amount to tip of finger to urethra before bed. Wash hands well after application. Use Monday, Wednesday and Friday   Vitamin D 50 MCG (2000 UT) tablet Take 2,000 Units by mouth daily.        Allergies:  Allergies  Allergen Reactions   Amlodipine     Leg cramping    Family History: Family History  Problem Relation Age of Onset   Cancer Mother        Liver   Stroke Mother    Hypertension Mother    Hematuria Mother    Liver  cancer Mother    Cancer Father    Prostate cancer Father    Kidney failure Brother    Breast cancer Neg Hx     Social History:  reports that she quit smoking about 15 years ago. Her smoking use included cigarettes. She has a 4.00 pack-year smoking history. She has never been exposed to tobacco smoke. She has never used smokeless tobacco. She reports that she does not currently use alcohol. She reports that she does not use drugs.  ROS:                                        Physical Exam: There were no vitals taken for this visit.  Constitutional:  Alert and oriented, No acute distress. HEENT: [place] AT, moist mucus membranes.  Trachea midline, no  masses.   Laboratory Data: Lab Results  Component Value Date   WBC 7.6 10/12/2021   HGB 11.3 (L) 10/12/2021   HCT 34.9 (L) 10/12/2021   MCV 80.4 10/12/2021   PLT 257 10/12/2021    Lab Results  Component Value Date   CREATININE 0.62 06/12/2022    No results found for: "PSA"  No results found for: "TESTOSTERONE"  No results found for: "HGBA1C"  Urinalysis    Component Value Date/Time   COLORURINE YELLOW 02/11/2016 1212   APPEARANCEUR Clear 06/13/2022 1328   LABSPEC 1.015 02/11/2016 1212   PHURINE 7.5 02/11/2016 1212   GLUCOSEU Negative 06/13/2022 1328   HGBUR NEGATIVE 02/11/2016 1212   BILIRUBINUR Negative 06/13/2022 1328   KETONESUR TRACE (A) 02/11/2016 1212   PROTEINUR Negative 06/13/2022 1328   PROTEINUR NEGATIVE 02/11/2016 1212   UROBILINOGEN 0.2 10/20/2021 1631   NITRITE Negative 06/13/2022 1328   NITRITE NEGATIVE 02/11/2016 1212   LEUKOCYTESUR 1+ (A) 06/13/2022 1328    Pertinent Imaging:   Assessment & Plan: 90 x 3 Gemtesa sent to pharmacy and I will see her in 1 year  1. Mixed incontinence  - Urinalysis, Complete   No follow-ups on file.  Reece Packer, MD  [facility] [address], [place], [place] [address] [phone]

## 2022-08-15 ENCOUNTER — Ambulatory Visit
Admission: RE | Admit: 2022-08-15 | Discharge: 2022-08-15 | Disposition: A | Discharge status: Home / Self Care | Payer: PPO | Source: Ambulatory Visit | Attending: Surgery | Admitting: Surgery

## 2022-08-15 DIAGNOSIS — Z1231 Encounter for screening mammogram for malignant neoplasm of breast: Secondary | ICD-10-CM | POA: Diagnosis not present

## 2022-08-23 ENCOUNTER — Encounter: Payer: Self-pay | Admitting: Surgery

## 2022-08-23 ENCOUNTER — Ambulatory Visit (INDEPENDENT_AMBULATORY_CARE_PROVIDER_SITE_OTHER): Payer: PPO | Admitting: Surgery

## 2022-08-23 ENCOUNTER — Other Ambulatory Visit: Payer: Self-pay

## 2022-08-23 VITALS — BP 184/95 | HR 67 | Temp 98.1°F | Ht 59.0 in | Wt 170.0 lb

## 2022-08-23 DIAGNOSIS — N6091 Unspecified benign mammary dysplasia of right breast: Secondary | ICD-10-CM | POA: Diagnosis not present

## 2022-08-23 NOTE — Progress Notes (Signed)
Surgical Clinic Progress/Follow-up Note   HPI:  72 y.o. Female presents to clinic for follow-up.  She had ADH, and excisional biopsy.  Patient reports some minimal shooting pains in the area lateral to her scar.  Made her aware of the good report of her most recent imaging.  She had declined endocrine therapy.  Review of Systems:  Constitutional: denies fever/chills  ENT: denies sore throat, hearing problems  Respiratory: denies shortness of breath, wheezing  Cardiovascular: denies chest pain, palpitations  Gastrointestinal: denies abdominal pain, N/V, or diarrhea/and bowel function as per interval history Skin: Denies any other rashes or skin discolorations as per interval history  Vital Signs:  BP (!) 184/95   Pulse 67   Temp 98.1 F (36.7 C) (Oral)   Ht 4\' 11"  (1.499 m)   Wt 170 lb (77.1 kg)   SpO2 100%   BMI 34.34 kg/m    Physical Exam:  Constitutional:  -- Norma body habitus  -- Awake, alert, and oriented x3  Pulmonary:  -- Breathing non-labored at rest Cardiovascular:  -- S1, S2 present  -- No pericardial rubs  Gastrointestinal:  -- Soft and non-distended, non-tender. Breast: Freda Munro present as chaperone.-- Post-surgical incisions all well-approximated without any peri-incisional thickening or mass effect -- No other dominant nor suspicious breast masses or nodularity present in either breast.  No evidence of skin changes, nipple skin inflammation.  No dimpling.  Fluid soft without nodularity or breast tissue density. Musculoskeletal / Integumentary:  -- Wounds or skin discoloration: None appreciated -- Extremities: B/L UE and LE FROM, hands and feet warm, no edema   Imaging:   CLINICAL DATA:  Screening.   EXAM: DIGITAL SCREENING BILATERAL MAMMOGRAM WITH TOMOSYNTHESIS AND CAD   TECHNIQUE: Bilateral screening digital craniocaudal and mediolateral oblique mammograms were obtained. Bilateral screening digital breast tomosynthesis was performed. The images were  evaluated with computer-aided detection.   COMPARISON:  Previous exam(s).   ACR Breast Density Category b: There are scattered areas of fibroglandular density.   FINDINGS: There are no findings suspicious for malignancy.   IMPRESSION: No mammographic evidence of malignancy. A result letter of this screening mammogram will be mailed directly to the patient.   RECOMMENDATION: Screening mammogram in one year. (Code:SM-B-01Y)   BI-RADS CATEGORY  1: Negative.     Electronically Signed   By: Evangeline Dakin M.D.   On: 08/16/2022 13:05  Assessment:  72 y.o. yo Female with a problem list including...  Patient Active Problem List   Diagnosis Date Noted   Atypical ductal hyperplasia of right breast 09/19/2021   Obesity with body mass index (BMI) of 30.0 to 39.9 01/01/2020   Cardiac syncope 05/18/2019   Hyperlipidemia, mixed 05/18/2019   Mixed stress and urge urinary incontinence 04/01/2018   Abnormal uterine bleeding (AUB) 05/15/2016   Right knee pain 07/26/2015   Allergic rhinitis, seasonal 12/09/2014   Menopausal symptoms 08/05/2014   HTN (hypertension) 11/18/2013   Anemia, unspecified 11/18/2013    presents to clinic for follow-up evaluation of right breast ADH, status post excision 1 yr ago, progressing well.  Plan:              - return to clinic in February 2025 or as needed, instructed to call office if any questions or concerns, gave options of following up with PCP, she prefers to follow-up here for clinical exam. Anticipating resumption of annual screening, with annual breast exam for history of ADH. All of the above recommendations were discussed with the patient, and all of  patient's questions were answered to her expressed satisfaction.  These notes generated with voice recognition software. I apologize for typographical errors.  Ronny Bacon, MD, FACS [place]: [facility] exceptional care. Office:  [phone]

## 2022-08-23 NOTE — Patient Instructions (Signed)
We will contact you in December 2024 to schedule the Mammogram and follow up visit with Dr.Rodenberg.

## 2022-10-13 ENCOUNTER — Other Ambulatory Visit: Payer: Self-pay | Admitting: Family Medicine

## 2022-10-13 DIAGNOSIS — I10 Essential (primary) hypertension: Secondary | ICD-10-CM

## 2022-10-15 NOTE — Telephone Encounter (Signed)
Requested Prescriptions  Pending Prescriptions Disp Refills   carvedilol (COREG) 3.125 MG tablet [Pharmacy Med Name: CARVEDILOL 3.125MG  TABLETS] 60 tablet 3    Sig: TAKE 1 TABLET(3.125 MG) BY MOUTH TWICE DAILY WITH A MEAL     Cardiovascular: Beta Blockers 3 Failed - 10/13/2022  7:37 PM      Failed - AST in normal range and within 360 days    AST  Date Value Ref Range Status  10/12/2021 18 15 - 41 U/L Final         Failed - ALT in normal range and within 360 days    ALT  Date Value Ref Range Status  10/12/2021 12 0 - 44 U/L Final         Failed - Last BP in normal range    BP Readings from Last 1 Encounters:  08/23/22 (!) 184/95         Passed - Cr in normal range and within 360 days    Creatinine, Ser  Date Value Ref Range Status  06/12/2022 0.62 0.57 - 1.00 mg/dL Final         Passed - Last Heart Rate in normal range    Pulse Readings from Last 1 Encounters:  08/23/22 67         Passed - Valid encounter within last 6 months    Recent Outpatient Visits           3 months ago Primary hypertension   [facility] [place], [name], MD   4 months ago Primary hypertension   [facility] [place], [name], MD   10 months ago Primary hypertension   [facility] [place], [name], MD   11 months ago Primary hypertension   [facility] [place], [name], MD   12 months ago [facility] [place], [name], MD       Future Appointments             In 1 month Juline Patch, MD [place] [facility] [place]   In 9 months Mac[facility], Nicki Reaper, Painter

## 2022-11-23 ENCOUNTER — Other Ambulatory Visit: Payer: Self-pay | Admitting: Family Medicine

## 2022-11-23 ENCOUNTER — Telehealth: Payer: Self-pay

## 2022-11-23 DIAGNOSIS — I10 Essential (primary) hypertension: Secondary | ICD-10-CM

## 2022-11-23 NOTE — Telephone Encounter (Signed)
Opened in error

## 2022-11-23 NOTE — Telephone Encounter (Signed)
Unable to refill per protocol, Rx expired. Discontinued 12/12/21.  Requested Prescriptions  Pending Prescriptions Disp Refills   lisinopril-hydrochlorothiazide (ZESTORETIC) 10-12.5 MG tablet [Pharmacy Med Name: LISINOPRIL-HCTZ 10/12.5MG  TABLETS] 90 tablet     Sig: TAKE 1 TABLET BY MOUTH DAILY     Cardiovascular:  ACEI + Diuretic Combos Failed - 11/23/2022  9:14 AM      Failed - Last BP in normal range    BP Readings from Last 1 Encounters:  08/23/22 (!) 184/95         Passed - Na in normal range and within 180 days    Sodium  Date Value Ref Range Status  06/12/2022 141 134 - 144 mmol/L Final         Passed - K in normal range and within 180 days    Potassium  Date Value Ref Range Status  06/12/2022 3.5 3.5 - 5.2 mmol/L Final         Passed - Cr in normal range and within 180 days    Creatinine, Ser  Date Value Ref Range Status  06/12/2022 0.62 0.57 - 1.00 mg/dL Final         Passed - eGFR is 30 or above and within 180 days    GFR, Estimated  Date Value Ref Range Status  10/12/2021 >60 >60 mL/min Final    Comment:    (NOTE) Calculated using the CKD-EPI Creatinine Equation (2021)    eGFR  Date Value Ref Range Status  06/12/2022 95 >59 mL/min/1.73 Final         Passed - Patient is not pregnant      Passed - Valid encounter within last 6 months    Recent Outpatient Visits           4 months ago Primary hypertension   [place] [facility]   5 months ago Primary hypertension   [place] [facility]   11 months ago Primary hypertension   [place] [facility]   1 year ago Primary hypertension   [place] [facility]   1 year ago Urethritis   [facility]        Future Appointments             In 2 weeks Duanne Limerick, MD [facility] at [facility]   In 8 months MacDiarmid, Lorin Picket, MD [facility]

## 2022-12-11 ENCOUNTER — Ambulatory Visit (INDEPENDENT_AMBULATORY_CARE_PROVIDER_SITE_OTHER): Payer: PPO | Admitting: Family Medicine

## 2022-12-11 ENCOUNTER — Encounter: Payer: Self-pay | Admitting: Family Medicine

## 2022-12-11 VITALS — BP 128/78 | HR 74 | Ht 59.0 in | Wt 173.0 lb

## 2022-12-11 DIAGNOSIS — J301 Allergic rhinitis due to pollen: Secondary | ICD-10-CM

## 2022-12-11 DIAGNOSIS — E782 Mixed hyperlipidemia: Secondary | ICD-10-CM

## 2022-12-11 DIAGNOSIS — I10 Essential (primary) hypertension: Secondary | ICD-10-CM | POA: Diagnosis not present

## 2022-12-11 MED ORDER — CARVEDILOL 3.125 MG PO TABS: ORAL_TABLET | ORAL | 1 refills | Status: DC

## 2022-12-11 MED ORDER — MONTELUKAST SODIUM 10 MG PO TABS: 10.0000 mg | ORAL_TABLET | Freq: Every day | ORAL | 1 refills | Status: DC

## 2022-12-11 NOTE — Progress Notes (Signed)
Date:  12/11/2022   Name:  Abigail Malone   DOB:  07/18/1951   MRN:  [id number]   Chief Complaint: Allergic Rhinitis  and Hypertension  Hypertension This is a chronic problem. The current episode started more than 1 year ago. The problem has been gradually improving since onset. The problem is controlled. Pertinent negatives include no anxiety, blurred vision, chest pain, headaches, malaise/fatigue, neck pain, orthopnea, palpitations, peripheral edema, PND, shortness of breath or sweats. There are no associated agents to hypertension. Past treatments include alpha 1 blockers and beta blockers. The current treatment provides moderate improvement. There are no compliance problems.  There is no history of angina, kidney disease, CAD/MI, CVA, heart failure, left ventricular hypertrophy, PVD or retinopathy. There is no history of chronic renal disease, a hypertension causing med or renovascular disease.  URI  This is a chronic problem. The current episode started more than 1 year ago. The problem has been waxing and waning. Associated symptoms include wheezing. Pertinent negatives include no abdominal pain, chest pain, diarrhea, dysuria, headaches, nausea, neck pain, rash, rhinorrhea, sinus pain, sneezing or sore throat. The treatment provided moderate relief.    Lab Results  Component Value Date   NA 141 06/12/2022   K 3.5 06/12/2022   CO2 25 06/12/2022   GLUCOSE 92 06/12/2022   BUN 7 (L) 06/12/2022   CREATININE 0.62 06/12/2022   CALCIUM 9.9 06/12/2022   EGFR 95 06/12/2022   GFRNONAA >60 10/12/2021   Lab Results  Component Value Date   CHOL 202 (H) 06/12/2022   HDL 55 06/12/2022   LDLCALC 124 (H) 06/12/2022   TRIG 127 06/12/2022   No results found for: "TSH" No results found for: "HGBA1C" Lab Results  Component Value Date   WBC 7.6 10/12/2021   HGB 11.3 (L) 10/12/2021   HCT 34.9 (L) 10/12/2021   MCV 80.4 10/12/2021   PLT 257 10/12/2021   Lab Results  Component Value Date    ALT 12 10/12/2021   AST 18 10/12/2021   ALKPHOS 71 10/12/2021   BILITOT 1.0 10/12/2021   No results found for: "25OHVITD2", "25OHVITD3", "VD25OH"   Review of Systems  Constitutional:  Negative for diaphoresis, fatigue, malaise/fatigue and unexpected weight change.  HENT:  Negative for postnasal drip, rhinorrhea, sinus pain, sneezing, sore throat and trouble swallowing.   Eyes:  Negative for blurred vision and visual disturbance.  Respiratory:  Positive for wheezing. Negative for choking, chest tightness and shortness of breath.   Cardiovascular:  Negative for chest pain, palpitations, orthopnea and PND.  Gastrointestinal:  Negative for abdominal pain, blood in stool, diarrhea and nausea.  Genitourinary:  Negative for difficulty urinating, dysuria and vaginal bleeding.  Musculoskeletal:  Positive for back pain. Negative for neck pain.  Skin:  Negative for color change and rash.  Neurological:  Negative for headaches.    Patient Active Problem List   Diagnosis Date Noted   Obesity with body mass index (BMI) of 30.0 to 39.9 01/01/2020   Cardiac syncope 05/18/2019   Hyperlipidemia, mixed 05/18/2019   Mixed stress and urge urinary incontinence 04/01/2018   Abnormal uterine bleeding (AUB) 05/15/2016   Right knee pain 07/26/2015   Allergic rhinitis, seasonal 12/09/2014   Menopausal symptoms 08/05/2014   HTN (hypertension) 11/18/2013   Anemia, unspecified 11/18/2013    Allergies  Allergen Reactions   Amlodipine     Leg cramping    Past Surgical History:  Procedure Laterality Date   BREAST BIOPSY Right 09/07/2021   stereo  calcs, ribbon clip, path pending.   BREAST BIOPSY WITH RADIO FREQUENCY LOCALIZER Right 10/18/2021   Procedure: BREAST BIOPSY WITH RADIO FREQUENCY LOCALIZER;  Surgeon: Campbell Lerner, MD;  Location: [facility] ORS;  Service: General;  Laterality: Right;   BREAST EXCISIONAL BIOPSY Right 2023   COLONOSCOPY  ~2018    Social History   Tobacco Use   Smoking  status: Former    Packs/day: 0.50    Years: 8.00    Additional pack years: 0.00    Total pack years: 4.00    Types: Cigarettes    Quit date: 12/22/2006    Years since quitting: 15.9    Passive exposure: Never   Smokeless tobacco: Never  Vaping Use   Vaping Use: Never used  Substance Use Topics   Alcohol use: Not Currently   Drug use: No     Medication list has been reviewed and updated.  Current Meds  Medication Sig   acetaminophen (TYLENOL) 500 MG tablet Take 1,000 mg by mouth every 6 (six) hours as needed for moderate pain.   carvedilol (COREG) 3.125 MG tablet TAKE 1 TABLET(3.125 MG) BY MOUTH TWICE DAILY WITH A MEAL   Cholecalciferol (VITAMIN D) 50 MCG (2000 UT) tablet Take 2,000 Units by mouth daily.   Coenzyme Q10 200 MG capsule Take 200 mg by mouth daily.   conjugated estrogens (PREMARIN) vaginal cream Estrogen Cream Instruction Discard applicator Apply pea sized amount to tip of finger to urethra before bed. Wash hands well after application. Use Monday, Wednesday and Friday   ibuprofen (ADVIL) 800 MG tablet TAKE 1 TABLET(800 MG) BY MOUTH EVERY 8 HOURS AS NEEDED   montelukast (SINGULAIR) 10 MG tablet Take 1 tablet (10 mg total) by mouth at bedtime.   nystatin cream (MYCOSTATIN) Apply 1 application. topically 2 (two) times daily.   Omega-3 Fatty Acids (FISH OIL) 1200 MG CAPS Take 1,200 mg by mouth daily.   Vibegron (GEMTESA) 75 MG TABS Take 1 tablet by mouth daily. urology       12/11/2022    9:00 AM 06/12/2022    9:06 AM 12/12/2021    9:23 AM 11/14/2021   10:20 AM  GAD 7 : Generalized Anxiety Score  Nervous, Anxious, on Edge 0 0 0 0  Control/stop worrying 0 0 0 0  Worry too much - different things 0 0 0 1  Trouble relaxing 0 0 0 1  Restless 0 0 0 0  Easily annoyed or irritable 0 0 0 0  Afraid - awful might happen 0 0 0 0  Total GAD 7 Score 0 0 0 2  Anxiety Difficulty Not difficult at all Not difficult at all Not difficult at all Not difficult at all        12/11/2022    9:00 AM 06/12/2022    9:06 AM 02/14/2022    9:04 AM  Depression screen PHQ 2/9  Decreased Interest 0 0 0  Down, Depressed, Hopeless 0 0 0  PHQ - 2 Score 0 0 0  Altered sleeping 0 0   Tired, decreased energy 0 0   Change in appetite 0 0   Feeling bad or failure about yourself  0 0   Trouble concentrating 0 0   Moving slowly or fidgety/restless 0 0   Suicidal thoughts 0 0   PHQ-9 Score 0 0   Difficult doing work/chores Not difficult at all Not difficult at all     BP Readings from Last 3 Encounters:  12/11/22 128/78  08/23/22 (!) 184/95  07/10/22 128/78    Physical Exam Vitals and nursing note reviewed. Exam conducted with a chaperone present.  Constitutional:      General: She is not in acute distress.    Appearance: She is not diaphoretic.  HENT:     Head: Normocephalic and atraumatic.     Right Ear: Tympanic membrane, ear canal and external ear normal. There is no impacted cerumen.     Left Ear: Tympanic membrane, ear canal and external ear normal. There is no impacted cerumen.     Nose: Nose normal. No congestion or rhinorrhea.     Mouth/Throat:     Mouth: Mucous membranes are moist.  Eyes:     General:        Right eye: No discharge.        Left eye: No discharge.     Conjunctiva/sclera: Conjunctivae normal.     Pupils: Pupils are equal, round, and reactive to light.  Neck:     Thyroid: No thyromegaly.     Vascular: No carotid bruit or JVD.  Cardiovascular:     Rate and Rhythm: Normal rate and regular rhythm.     Heart sounds: Normal heart sounds. No murmur heard.    No friction rub. No gallop.  Pulmonary:     Effort: Pulmonary effort is normal.     Breath sounds: Normal breath sounds.  Abdominal:     General: Bowel sounds are normal.     Palpations: Abdomen is soft. There is no mass.     Tenderness: There is no abdominal tenderness. There is no guarding.  Musculoskeletal:        General: Normal range of motion.     Cervical back: Normal range  of motion and neck supple.  Lymphadenopathy:     Cervical: No cervical adenopathy.  Skin:    General: Skin is warm and dry.  Neurological:     Mental Status: She is alert.     Sensory: Sensation is intact.     Motor: Motor function is intact.     Deep Tendon Reflexes: Reflexes are normal and symmetric.     Reflex Scores:      Patellar reflexes are 2+ on the right side and 2+ on the left side.    Wt Readings from Last 3 Encounters:  12/11/22 173 lb (78.5 kg)  08/23/22 170 lb (77.1 kg)  08/06/22 173 lb (78.5 kg)    BP 128/78   Pulse 74   Ht 4\' 11"  (1.499 m)   Wt 173 lb (78.5 kg)   SpO2 98%   BMI 34.94 kg/m   Assessment and Plan: 1. Primary hypertension  Chronic.  Controlled.  Stable.  Blood pressure 128/78.  Asymptomatic.  Tolerating medication well.  Patient did not take her medication this morning.  Patient's been encouraged to take it in the future.  Continue carvedilol 3.1251 twice a day.  Will recheck blood pressure in 6 months.  Today we will check a CMP for electrolytes and GFR.  Patient has also been instructed to limit her sodium intake. - carvedilol (COREG) 3.125 MG tablet; TAKE 1 TABLET(3.125 MG) BY MOUTH TWICE DAILY WITH A MEAL  Dispense: 180 tablet; Refill: 1 - Comprehensive Metabolic Panel (CMET)  2. Seasonal allergic rhinitis due to pollen Chronic.  Controlled.  Stable.  Rhinitis symptoms are controlled with Singulair 10 mg nightly.  Has also had some wheezing and this will likely help with reactive airway disease as well. - montelukast (SINGULAIR) 10 MG tablet; Take  1 tablet (10 mg total) by mouth at bedtime.  Dispense: 90 tablet; Refill: 1  3. Hyperlipidemia, mixed Chronic.  Diet controlled.  Stable.  Will check lipid panel for current level of LDL and if elevated may need to discuss initiation of statin. - Lipid Panel With LDL/HDL Ratio     Elizabeth Sauer, MD

## 2022-12-11 NOTE — Patient Instructions (Signed)

## 2022-12-12 LAB — COMPREHENSIVE METABOLIC PANEL
ALT: 13 IU/L (ref 0–32)
AST: 16 IU/L (ref 0–40)
Albumin/Globulin Ratio: 1.5 (ref 1.2–2.2)
Albumin: 4.1 g/dL (ref 3.8–4.8)
Alkaline Phosphatase: 105 IU/L (ref 44–121)
BUN/Creatinine Ratio: 12 (ref 12–28)
BUN: 7 mg/dL — ABNORMAL LOW (ref 8–27)
Bilirubin Total: 0.6 mg/dL (ref 0.0–1.2)
CO2: 26 mmol/L (ref 20–29)
Calcium: 9.4 mg/dL (ref 8.7–10.3)
Chloride: 106 mmol/L (ref 96–106)
Creatinine, Ser: 0.57 mg/dL (ref 0.57–1.00)
Globulin, Total: 2.7 g/dL (ref 1.5–4.5)
Glucose: 87 mg/dL (ref 70–99)
Potassium: 4 mmol/L (ref 3.5–5.2)
Sodium: 145 mmol/L — ABNORMAL HIGH (ref 134–144)
Total Protein: 6.8 g/dL (ref 6.0–8.5)
eGFR: 97 mL/min/{1.73_m2} (ref >59)

## 2022-12-12 LAB — LIPID PANEL WITH LDL/HDL RATIO
Cholesterol, Total: 197 mg/dL (ref 100–199)
HDL: 67 mg/dL (ref >39)
LDL Chol Calc (NIH): 115 mg/dL — ABNORMAL HIGH (ref 0–99)
LDL/HDL Ratio: 1.7 ratio (ref 0.0–3.2)
Triglycerides: 82 mg/dL (ref 0–149)
VLDL Cholesterol Cal: 15 mg/dL (ref 5–40)

## 2023-02-20 ENCOUNTER — Ambulatory Visit (INDEPENDENT_AMBULATORY_CARE_PROVIDER_SITE_OTHER): Payer: PPO

## 2023-02-20 VITALS — BP 160/80 | Ht 59.0 in | Wt 177.6 lb

## 2023-02-20 DIAGNOSIS — Z780 Asymptomatic menopausal state: Secondary | ICD-10-CM

## 2023-02-20 DIAGNOSIS — Z0000 Encounter for general adult medical examination without abnormal findings: Secondary | ICD-10-CM

## 2023-02-20 NOTE — Progress Notes (Signed)
Subjective:   Abigail Malone is a 72 y.o. female who presents for Medicare Annual (Subsequent) preventive examination.  Visit Complete: In person  Review of Systems     Cardiac Risk Factors include: advanced age (>65men, >45 women);family history of premature cardiovascular disease;hypertension;obesity (BMI >30kg/m2)     Objective:    Today's Vitals   02/20/23 0843 02/20/23 0848  BP: (!) 160/80   Height: 4\' 11"  (1.499 m)   PainSc:  7    Body mass index is 34.94 kg/m.     02/20/2023    8:54 AM 02/14/2022    9:03 AM 11/10/2021   10:57 AM 10/18/2021    6:29 AM 10/11/2021    1:18 PM 12/22/2018   11:26 AM  Advanced Directives  Does Patient Have a Medical Advance Directive? No No No No No No  Would patient like information on creating a medical advance directive? No - Patient declined Yes (MAU/Ambulatory/Procedural Areas - Information given) No - Patient declined  No - Patient declined No - Guardian declined    Current Medications (verified) Outpatient Encounter Medications as of 02/20/2023  Medication Sig   acetaminophen (TYLENOL) 500 MG tablet Take 1,000 mg by mouth every 6 (six) hours as needed for moderate pain.   carvedilol (COREG) 3.125 MG tablet TAKE 1 TABLET(3.125 MG) BY MOUTH TWICE DAILY WITH A MEAL   Cholecalciferol (VITAMIN D) 50 MCG (2000 UT) tablet Take 2,000 Units by mouth daily.   Coenzyme Q10 200 MG capsule Take 200 mg by mouth daily.   conjugated estrogens (PREMARIN) vaginal cream Estrogen Cream Instruction Discard applicator Apply pea sized amount to tip of finger to urethra before bed. Wash hands well after application. Use Monday, Wednesday and Friday   ibuprofen (ADVIL) 800 MG tablet TAKE 1 TABLET(800 MG) BY MOUTH EVERY 8 HOURS AS NEEDED   montelukast (SINGULAIR) 10 MG tablet Take 1 tablet (10 mg total) by mouth at bedtime.   nystatin cream (MYCOSTATIN) Apply 1 application. topically 2 (two) times daily.   Omega-3 Fatty Acids (FISH OIL) 1200 MG CAPS Take  1,200 mg by mouth daily.   Vibegron (GEMTESA) 75 MG TABS Take 1 tablet by mouth daily. urology   No facility-administered encounter medications on file as of 02/20/2023.    Allergies (verified) Amlodipine   History: Past Medical History:  Diagnosis Date   Abnormal uterine bleeding (AUB) 05/15/2016   Allergic rhinitis, seasonal 12/09/2014   Anemia, unspecified 11/18/2013   Atypical ductal hyperplasia of right breast 09/19/2021   HTN (hypertension) 11/18/2013   Hypertension    Menopausal symptoms 08/05/2014   Right knee pain 07/26/2015   Past Surgical History:  Procedure Laterality Date   BREAST BIOPSY Right 09/07/2021   stereo calcs, ribbon clip, path pending.   BREAST BIOPSY WITH RADIO FREQUENCY LOCALIZER Right 10/18/2021   Procedure: BREAST BIOPSY WITH RADIO FREQUENCY LOCALIZER;  Surgeon: Campbell Lerner, MD;  Location: [facility] ORS;  Service: General;  Laterality: Right;   BREAST EXCISIONAL BIOPSY Right 2023   COLONOSCOPY  ~2018   Family History  Problem Relation Age of Onset   Cancer Mother        Liver   Stroke Mother    Hypertension Mother    Hematuria Mother    Liver cancer Mother    Cancer Father    Prostate cancer Father    Kidney failure Brother    Breast cancer Neg Hx    Social History   Socioeconomic History   Marital status: Married  Spouse name: Not on file   Number of children: Not on file   Years of education: Not on file   Highest education level: Not on file  Occupational History   Not on file  Tobacco Use   Smoking status: Former    Current packs/day: 0.00    Average packs/day: 0.5 packs/day for 8.0 years (4.0 ttl pk-yrs)    Types: Cigarettes    Start date: 12/22/1998    Quit date: 12/22/2006    Years since quitting: 16.1    Passive exposure: Never   Smokeless tobacco: Never  Vaping Use   Vaping status: Never Used  Substance and Sexual Activity   Alcohol use: Not Currently   Drug use: No   Sexual activity: Not Currently  Other Topics  Concern   Not on file  Social History Narrative   Not on file   Social Determinants of Health   Financial Resource Strain: Low Risk  (02/20/2023)   Overall Financial Resource Strain (CARDIA)    Difficulty of Paying Living Expenses: Not hard at all  Food Insecurity: No Food Insecurity (02/20/2023)   Hunger Vital Sign    Worried About Running Out of Food in the Last Year: Never true    Ran Out of Food in the Last Year: Never true  Transportation Needs: No Transportation Needs (02/20/2023)   PRAPARE - Administrator, Civil Service (Medical): No    Lack of Transportation (Non-Medical): No  Physical Activity: Insufficiently Active (02/20/2023)   Exercise Vital Sign    Days of Exercise per Week: 4 days    Minutes of Exercise per Session: 20 min  Stress: No Stress Concern Present (02/20/2023)   [employer] of Occupational Health - Occupational Stress Questionnaire    Feeling of Stress : Not at all  Social Connections: Moderately Integrated (02/20/2023)   Social Connection and Isolation Panel [NHANES]    Frequency of Communication with Friends and Family: More than three times a week    Frequency of Social Gatherings with Friends and Family: Not on file    Attends Religious Services: More than 4 times per year    Active Member of [employer] or Organizations: No    Attends Engineer, structural: Never    Marital Status: Married    Tobacco Counseling Counseling given: Not Answered   Clinical Intake:  Pre-visit preparation completed: Yes  Pain : 0-10 Pain Score: 7  Pain Type: Chronic pain Pain Location: Shoulder Pain Orientation: Right     Nutritional Risks: None Diabetes: No  How often do you need to have someone help you when you read instructions, pamphlets, or other written materials from your doctor or pharmacy?: 1 - Never  Interpreter Needed?: No  Information entered by :: Kennedy Bucker, LPN   Activities of Daily Living    02/20/2023    8:55 AM   In your present state of health, do you have any difficulty performing the following activities:  Hearing? 0  Vision? 0  Difficulty concentrating or making decisions? 0  Walking or climbing stairs? 0  Dressing or bathing? 0  Doing errands, shopping? 0  Preparing Food and eating ? N  Using the Toilet? N  In the past six months, have you accidently leaked urine? N  Do you have problems with loss of bowel control? N  Managing your Medications? N  Managing your Finances? N  Housekeeping or managing your Housekeeping? N    Patient Care Team: Duanne Limerick, MD as  PCP - General (Family Medicine)  Indicate any recent Medical Services you may have received from other than [facility] providers in the past year (date may be approximate).     Assessment:   This is a routine wellness examination for [place].  Hearing/Vision screen Hearing Screening - Comments:: No aids Vision Screening - Comments:: Wears glasses- [facility]  Dietary issues and exercise activities discussed:     Goals Addressed             This Visit's Progress    DIET - EAT MORE FRUITS AND VEGETABLES         Depression Screen    02/20/2023    8:52 AM 12/11/2022    9:00 AM 06/12/2022    9:06 AM 02/14/2022    9:04 AM 02/14/2022    8:59 AM 12/12/2021    9:22 AM 11/14/2021   10:20 AM  PHQ 2/9 Scores  PHQ - 2 Score 0 0 0 0 0 0 0  PHQ- 9 Score 0 0 0   0 0    Fall Risk    02/20/2023    8:54 AM 12/11/2022    9:00 AM 08/23/2022    2:03 PM 05/03/2022    1:16 PM 02/14/2022    9:04 AM  Fall Risk   Falls in the past year? 1 0 0 0 0  Number falls in past yr: 0 0   0  Injury with Fall? 0 0   0  Risk for fall due to : History of fall(s) No Fall Risks   No Fall Risks  Follow up Falls evaluation completed;Falls prevention discussed Falls evaluation completed   Falls evaluation completed    MEDICARE RISK AT HOME:  Medicare Risk at Home - 02/20/23 0856     Any stairs in or around the home? Yes    If so, are there any  without handrails? No    Home free of loose throw rugs in walkways, pet beds, electrical cords, etc? Yes    Adequate lighting in your home to reduce risk of falls? Yes    Life alert? No    Use of a cane, Codd or w/c? Yes   uses cane when goes walking   Grab bars in the bathroom? Yes    Shower chair or bench in shower? Yes    Elevated toilet seat or a handicapped toilet? No             TIMED UP AND GO:  Was the test performed?  Yes  Length of time to ambulate 10 feet: 4 sec Gait steady and fast without use of assistive device    Cognitive Function:        02/20/2023    8:57 AM 02/14/2022    9:08 AM  6CIT Screen  What Year? 0 points 0 points  What month? 0 points 0 points  What time? 0 points 0 points  Count back from 20 0 points 0 points  Months in reverse 0 points 0 points  Repeat phrase 4 points 0 points  Total Score 4 points 0 points    Immunizations Immunization History  Administered Date(s) Administered   Fluad Quad(high Dose 65+) 04/03/2021   Hepatitis B 07/09/2000, 08/15/2000   Influenza-Unspecified 03/21/2022   Moderna Sars-Covid-2 Vaccination 09/04/2019, 10/05/2019, 06/17/2020, 03/21/2022   PNEUMOCOCCAL CONJUGATE-20 06/12/2022   Pneumococcal Conjugate-13 03/07/2017   Pneumococcal Polysaccharide-23 06/07/2015    TDAP status: Due, Education has been provided regarding the importance of this vaccine. Advised may receive  this vaccine at local pharmacy or Health Dept. Aware to provide a copy of the vaccination record if obtained from local pharmacy or Health Dept. Verbalized acceptance and understanding.  Flu Vaccine status: Up to date  Pneumococcal vaccine status: Up to date  Covid-19 vaccine status: Completed vaccines  Qualifies for Shingles Vaccine? Yes   Zostavax completed No   Shingrix Completed?: No.    Education has been provided regarding the importance of this vaccine. Patient has been advised to call insurance company to determine out of pocket  expense if they have not yet received this vaccine. Advised may also receive vaccine at local pharmacy or Health Dept. Verbalized acceptance and understanding.  Screening Tests Health Maintenance  Topic Date Due   DTaP/Tdap/Td (1 - Tdap) Never done   COVID-19 Vaccine (5 - 2023-24 season) 05/16/2022   DEXA SCAN  06/13/2023 (Originally 01/20/2016)   INFLUENZA VACCINE  02/21/2023   MAMMOGRAM  08/16/2023   Medicare Annual Wellness (AWV)  02/20/2024   Colonoscopy  08/16/2026   Pneumonia Vaccine 38+ Years old  Completed   HPV VACCINES  Aged Out   Hepatitis C Screening  Discontinued   Zoster Vaccines- Shingrix  Discontinued    Health Maintenance  Health Maintenance Due  Topic Date Due   DTaP/Tdap/Td (1 - Tdap) Never done   COVID-19 Vaccine (5 - 2023-24 season) 05/16/2022    Colorectal cancer screening: Type of screening: Colonoscopy. Completed 08/16/16. Repeat every 10 years  Mammogram status: Completed 08/15/22. Repeat every year  Bone Density status: Ordered 02/20/23. Pt provided with contact info and advised to call to schedule appt.  Lung Cancer Screening: (Low Dose CT Chest recommended if Age 40-80 years, 20 pack-year currently smoking OR have quit w/in 15years.) does not qualify.    Additional Screening:  Hepatitis C Screening: does qualify; Completed no  Vision Screening: Recommended annual ophthalmology exams for early detection of glaucoma and other disorders of the eye. Is the patient up to date with their annual eye exam?  Yes  Who is the provider or what is the name of the office in which the patient attends annual eye exams? [facility] If pt is not established with a provider, would they like to be referred to a provider to establish care? No .   Dental Screening: Recommended annual dental exams for proper oral hygiene   Community Resource Referral / Chronic Care Management: CRR required this visit?  No   CCM required this visit?  No     Plan:     I have  personally reviewed and noted the following in the patient's chart:   Medical and social history Use of alcohol, tobacco or illicit drugs  Current medications and supplements including opioid prescriptions. Patient is not currently taking opioid prescriptions. Functional ability and status Nutritional status Physical activity Advanced directives List of other physicians Hospitalizations, surgeries, and ER visits in previous 12 months Vitals Screenings to include cognitive, depression, and falls Referrals and appointments  In addition, I have reviewed and discussed with patient certain preventive protocols, quality metrics, and best practice recommendations. A written personalized care plan for preventive services as well as general preventive health recommendations were provided to patient.     Hal Hope, LPN   10/29/8117   After Visit Summary: (MyChart) Due to this being a telephonic visit, the after visit summary with patients personalized plan was offered to patient via MyChart   Nurse Notes: none

## 2023-02-20 NOTE — Patient Instructions (Addendum)
Abigail Malone , Thank you for taking time to come for your Medicare Wellness Visit. I appreciate your ongoing commitment to your health goals. Please review the following plan we discussed and let me know if I can assist you in the future.   Referrals/Orders/Follow-Ups/Clinician Recommendations: dexa ordered  This is a list of the screening recommended for you and due dates:  Health Maintenance  Topic Date Due   DTaP/Tdap/Td vaccine (1 - Tdap) Never done   COVID-19 Vaccine (5 - 2023-24 season) 05/16/2022   DEXA scan (bone density measurement)  06/13/2023*   Flu Shot  02/21/2023   Mammogram  08/16/2023   Medicare Annual Wellness Visit  02/20/2024   Colon Cancer Screening  08/16/2026   Pneumonia Vaccine  Completed   HPV Vaccine  Aged Out   Hepatitis C Screening  Discontinued   Zoster (Shingles) Vaccine  Discontinued  *Topic was postponed. The date shown is not the original due date.    Advanced directives: (Declined) Advance directive discussed with you today. Even though you declined this today, please call our office should you change your mind, and we can give you the proper paperwork for you to fill out.  Next Medicare Annual Wellness Visit scheduled for next year: Yes   02/26/24 @ 8:45 am in person  Preventive Care 65 Years and Older, Female Preventive care refers to lifestyle choices and visits with your health care provider that can promote health and wellness. What does preventive care include? A yearly physical exam. This is also called an annual well check. Dental exams once or twice a year. Routine eye exams. Ask your health care provider how often you should have your eyes checked. Personal lifestyle choices, including: Daily care of your teeth and gums. Regular physical activity. Eating a healthy diet. Avoiding tobacco and drug use. Limiting alcohol use. Practicing safe sex. Taking low-dose aspirin every day. Taking vitamin and mineral supplements as recommended by your  health care provider. What happens during an annual well check? The services and screenings done by your health care provider during your annual well check will depend on your age, overall health, lifestyle risk factors, and family history of disease. Counseling  Your health care provider may ask you questions about your: Alcohol use. Tobacco use. Drug use. Emotional well-being. Home and relationship well-being. Sexual activity. Eating habits. History of falls. Memory and ability to understand (cognition). Work and work Astronomer. Reproductive health. Screening  You may have the following tests or measurements: Height, weight, and BMI. Blood pressure. Lipid and cholesterol levels. These may be checked every 5 years, or more frequently if you are over 39 years old. Skin check. Lung cancer screening. You may have this screening every year starting at age 1 if you have a 30-pack-year history of smoking and currently smoke or have quit within the past 15 years. Fecal occult blood test (FOBT) of the stool. You may have this test every year starting at age 56. Flexible sigmoidoscopy or colonoscopy. You may have a sigmoidoscopy every 5 years or a colonoscopy every 10 years starting at age 52. Hepatitis C blood test. Hepatitis B blood test. Sexually transmitted disease (STD) testing. Diabetes screening. This is done by checking your blood sugar (glucose) after you have not eaten for a while (fasting). You may have this done every 1-3 years. Bone density scan. This is done to screen for osteoporosis. You may have this done starting at age 89. Mammogram. This may be done every 1-2 years. Talk to your  health care provider about how often you should have regular mammograms. Talk with your health care provider about your test results, treatment options, and if necessary, the need for more tests. Vaccines  Your health care provider may recommend certain vaccines, such as: Influenza vaccine.  This is recommended every year. Tetanus, diphtheria, and acellular pertussis (Tdap, Td) vaccine. You may need a Td booster every 10 years. Zoster vaccine. You may need this after age 34. Pneumococcal 13-valent conjugate (PCV13) vaccine. One dose is recommended after age 40. Pneumococcal polysaccharide (PPSV23) vaccine. One dose is recommended after age 53. Talk to your health care provider about which screenings and vaccines you need and how often you need them. This information is not intended to replace advice given to you by your health care provider. Make sure you discuss any questions you have with your health care provider. Document Released: 08/05/2015 Document Revised: 03/28/2016 Document Reviewed: 05/10/2015 Elsevier Interactive Patient Education  2017 [employer].  Fall Prevention in the Home Falls can cause injuries. They can happen to people of all ages. There are many things you can do to make your home safe and to help prevent falls. What can I do on the outside of my home? Regularly fix the edges of walkways and driveways and fix any cracks. Remove anything that might make you trip as you walk through a door, such as a raised step or threshold. Trim any bushes or trees on the path to your home. Use bright outdoor lighting. Clear any walking paths of anything that might make someone trip, such as rocks or tools. Regularly check to see if handrails are loose or broken. Make sure that both sides of any steps have handrails. Any raised decks and porches should have guardrails on the edges. Have any leaves, snow, or ice cleared regularly. Use sand or salt on walking paths during winter. Clean up any spills in your garage right away. This includes oil or grease spills. What can I do in the bathroom? Use night lights. Install grab bars by the toilet and in the tub and shower. Do not use towel bars as grab bars. Use non-skid mats or decals in the tub or shower. If you need to sit  down in the shower, use a plastic, non-slip stool. Keep the floor dry. Clean up any water that spills on the floor as soon as it happens. Remove soap buildup in the tub or shower regularly. Attach bath mats securely with double-sided non-slip rug tape. Do not have throw rugs and other things on the floor that can make you trip. What can I do in the bedroom? Use night lights. Make sure that you have a light by your bed that is easy to reach. Do not use any sheets or blankets that are too big for your bed. They should not hang down onto the floor. Have a firm chair that has side arms. You can use this for support while you get dressed. Do not have throw rugs and other things on the floor that can make you trip. What can I do in the kitchen? Clean up any spills right away. Avoid walking on wet floors. Keep items that you use a lot in easy-to-reach places. If you need to reach something above you, use a strong step stool that has a grab bar. Keep electrical cords out of the way. Do not use floor polish or wax that makes floors slippery. If you must use wax, use non-skid floor wax. Do not have  throw rugs and other things on the floor that can make you trip. What can I do with my stairs? Do not leave any items on the stairs. Make sure that there are handrails on both sides of the stairs and use them. Fix handrails that are broken or loose. Make sure that handrails are as long as the stairways. Check any carpeting to make sure that it is firmly attached to the stairs. Fix any carpet that is loose or worn. Avoid having throw rugs at the top or bottom of the stairs. If you do have throw rugs, attach them to the floor with carpet tape. Make sure that you have a light switch at the top of the stairs and the bottom of the stairs. If you do not have them, ask someone to add them for you. What else can I do to help prevent falls? Wear shoes that: Do not have high heels. Have rubber bottoms. Are  comfortable and fit you well. Are closed at the toe. Do not wear sandals. If you use a stepladder: Make sure that it is fully opened. Do not climb a closed stepladder. Make sure that both sides of the stepladder are locked into place. Ask someone to hold it for you, if possible. Clearly mark and make sure that you can see: Any grab bars or handrails. First and last steps. Where the edge of each step is. Use tools that help you move around (mobility aids) if they are needed. These include: Canes. Walkers. Scooters. Crutches. Turn on the lights when you go into a dark area. Replace any light bulbs as soon as they burn out. Set up your furniture so you have a clear path. Avoid moving your furniture around. If any of your floors are uneven, fix them. If there are any pets around you, be aware of where they are. Review your medicines with your doctor. Some medicines can make you feel dizzy. This can increase your chance of falling. Ask your doctor what other things that you can do to help prevent falls. This information is not intended to replace advice given to you by your health care provider. Make sure you discuss any questions you have with your health care provider. Document Released: 05/05/2009 Document Revised: 12/15/2015 Document Reviewed: 08/13/2014 Elsevier Interactive Patient Education  2017 [employer].

## 2023-02-25 ENCOUNTER — Ambulatory Visit
Admission: RE | Admit: 2023-02-25 | Discharge: 2023-02-25 | Disposition: A | Discharge status: Home / Self Care | Payer: PPO | Attending: Family Medicine | Admitting: Family Medicine

## 2023-02-25 ENCOUNTER — Ambulatory Visit
Admission: RE | Admit: 2023-02-25 | Discharge: 2023-02-25 | Disposition: A | Discharge status: Home / Self Care | Payer: PPO | Source: Ambulatory Visit | Attending: Family Medicine | Admitting: Family Medicine

## 2023-02-25 ENCOUNTER — Ambulatory Visit (INDEPENDENT_AMBULATORY_CARE_PROVIDER_SITE_OTHER): Payer: PPO | Admitting: Family Medicine

## 2023-02-25 ENCOUNTER — Encounter: Payer: Self-pay | Admitting: Family Medicine

## 2023-02-25 VITALS — BP 138/80 | HR 88 | Ht 59.0 in | Wt 177.0 lb

## 2023-02-25 DIAGNOSIS — S4991XA Unspecified injury of right shoulder and upper arm, initial encounter: Secondary | ICD-10-CM | POA: Diagnosis not present

## 2023-02-25 DIAGNOSIS — M7541 Impingement syndrome of right shoulder: Secondary | ICD-10-CM | POA: Insufficient documentation

## 2023-02-25 MED ORDER — MELOXICAM 7.5 MG PO TABS: ORAL_TABLET | ORAL | 0 refills | Status: AC

## 2023-02-25 NOTE — Progress Notes (Signed)
     Primary Care / Sports Medicine Office Visit  Patient Information:  Patient ID: PAYTIN EISER, female DOB: [birth year] Age: 72 y.o. MRN: [id number]   Abigail Malone is a pleasant 72 y.o. female presenting with the following:  Chief Complaint  Patient presents with   Shoulder Pain    Right shoulder for 1 year, fell on shoulder 1 year ago    Vitals:   02/25/23 0955  BP: 138/80  Pulse: 88  SpO2: 100%   Vitals:   02/25/23 0955  Weight: 177 lb (80.3 kg)  Height: 4\' 11"  (1.499 m)   Body mass index is 35.75 kg/m.  No results found.   Independent interpretation of notes and tests performed by another provider:   None  Procedures performed:   None  Pertinent History, Exam, Impression, and Recommendations:   Abigail Malone was seen today for shoulder pain.  Rotator cuff impingement syndrome, right Assessment & Plan: Right-hand-dominant patient presenting with 1 year history of traumatic right superolateral shoulder pain.  She states that roughly 1 year prior's that she was stepping out of the golf cart, had a misstep, sustained a blow to her right lateral shoulder, did not have any immediate dysfunction but gradually noted worsening pain.  Did not seek medical attention, pain has gradually worsened over this past 1 year, interferes with ADLs, some night pain, some radiation distally without paresthesias, has been treating this with activity modification and Tylenol daily.  Examination shows full, painful range of motion overhead, limited extension/internal rotation to the mid lumbar region, contralateral to the mid thoracic region, 5/5 RC testing with pain during isolated external rotation and supraspinatus testing, positive Neer's, positive Hawkins, negative speeds, Yergason's, cross body testing, nontender throughout, no crepitus noted.  Negative cervical exam.  History and findings are most consistent with rotator cuff related impingement.  Plan as follows: - Obtain x-ray  today - Take meloxicam daily with food x 14 days then daily as-needed - Can use ice, heat, and Tylenol for additional pain control - Start home exercises in 2 weeks, focus on steady advance - Conduct light activity as tolerated using shoulder symptoms as a guide - Return in 4 weeks, contact for any questions / concerns between now and then -Pending x-rays and clinical progress, suboptimal findings can be addressed with consideration of subacromial corticosteroid injection, advanced imaging, modification of pharmacotherapy, incorporation of formal PT  Orders: -     Meloxicam; Take 1 tablet (7.5 mg total) by mouth daily for 14 days, THEN 1 tablet (7.5 mg total) daily as needed for up to 14 days for pain.  Dispense: 30 tablet; Refill: 0 -     DG Shoulder Right; Future     Orders & Medications Meds ordered this encounter  Medications   meloxicam (MOBIC) 7.5 MG tablet    Sig: Take 1 tablet (7.5 mg total) by mouth daily for 14 days, THEN 1 tablet (7.5 mg total) daily as needed for up to 14 days for pain.    Dispense:  30 tablet    Refill:  0   Orders Placed This Encounter  Procedures   DG Shoulder Right     No follow-ups on file.     Jerrol Banana, MD, [facility]   Primary Care Sports Medicine Primary Care and Sports Medicine at [facility]

## 2023-02-25 NOTE — Assessment & Plan Note (Signed)
Right-hand-dominant patient presenting with 1 year history of traumatic right superolateral shoulder pain.  She states that roughly 1 year prior's that she was stepping out of the golf cart, had a misstep, sustained a blow to her right lateral shoulder, did not have any immediate dysfunction but gradually noted worsening pain.  Did not seek medical attention, pain has gradually worsened over this past 1 year, interferes with ADLs, some night pain, some radiation distally without paresthesias, has been treating this with activity modification and Tylenol daily.  Examination shows full, painful range of motion overhead, limited extension/internal rotation to the mid lumbar region, contralateral to the mid thoracic region, 5/5 RC testing with pain during isolated external rotation and supraspinatus testing, positive Neer's, positive Hawkins, negative speeds, Yergason's, cross body testing, nontender throughout, no crepitus noted.  Negative cervical exam.  History and findings are most consistent with rotator cuff related impingement.  Plan as follows: - Obtain x-ray today - Take meloxicam daily with food x 14 days then daily as-needed - Can use ice, heat, and Tylenol for additional pain control - Start home exercises in 2 weeks, focus on steady advance - Conduct light activity as tolerated using shoulder symptoms as a guide - Return in 4 weeks, contact for any questions / concerns between now and then -Pending x-rays and clinical progress, suboptimal findings can be addressed with consideration of subacromial corticosteroid injection, advanced imaging, modification of pharmacotherapy, incorporation of formal PT

## 2023-02-25 NOTE — Patient Instructions (Signed)
-   Obtain x-ray today - Take meloxicam daily with food x 14 days then daily as-needed - Can use ice, heat, and Tylenol for additional pain control - Start home exercises in 2 weeks, focus on steady advance - Conduct light activity as tolerated using shoulder symptoms as a guide - Return in 4 weeks, contact for any questions / concerns between now and then

## 2023-03-29 ENCOUNTER — Ambulatory Visit (INDEPENDENT_AMBULATORY_CARE_PROVIDER_SITE_OTHER): Payer: PPO | Admitting: Family Medicine

## 2023-03-29 ENCOUNTER — Other Ambulatory Visit (INDEPENDENT_AMBULATORY_CARE_PROVIDER_SITE_OTHER): Payer: PPO | Admitting: Radiology

## 2023-03-29 ENCOUNTER — Encounter: Payer: Self-pay | Admitting: Family Medicine

## 2023-03-29 VITALS — BP 144/98 | HR 78 | Ht 59.0 in | Wt 180.0 lb

## 2023-03-29 DIAGNOSIS — M7541 Impingement syndrome of right shoulder: Secondary | ICD-10-CM

## 2023-03-29 MED ORDER — MELOXICAM 7.5 MG PO TABS
7.5000 mg | ORAL_TABLET | Freq: Two times a day (BID) | ORAL | 0 refills | Status: DC | PRN
Start: 2023-03-29 — End: 2023-06-07

## 2023-03-29 NOTE — Progress Notes (Signed)
     Primary Care / Sports Medicine Office Visit  Patient Information:  Patient ID: Abigail Malone, female DOB: [birth year] Age: 72 y.o. MRN: [id number]   Abigail Malone is a pleasant 72 y.o. female presenting with the following:  Chief Complaint  Patient presents with   Rotator cuff impingement syndrome, right    Vitals:   03/29/23 1033  BP: (!) 144/98  Pulse: 78  SpO2: 98%   Vitals:   03/29/23 1033  Weight: 180 lb (81.6 kg)  Height: 4\' 11"  (1.499 m)   Body mass index is 36.36 kg/m.  No results found.   Independent interpretation of notes and tests performed by another provider:   None  Procedures performed:   Procedure:  Injection of right subacromial space under ultrasound guidance. Ultrasound guidance utilized for in-plane approach to the right subacromial space, sonographic and subcortical roughening noted, supraspinatus appears intact Samsung HS60 device utilized with permanent recording / reporting. Verbal informed consent obtained and verified. Skin prepped in a sterile fashion. Ethyl chloride for topical local analgesia.  Completed without difficulty and tolerated well. Medication: triamcinolone acetonide 40 mg/mL suspension for injection 1 mL total and 2 mL lidocaine 1% without epinephrine utilized for needle placement anesthetic Advised to contact for fevers/chills, erythema, induration, drainage, or persistent bleeding.   Pertinent History, Exam, Impression, and Recommendations:   Abigail Malone was seen today for rotator cuff impingement syndrome, right.  Rotator cuff impingement syndrome, right Assessment & Plan: Ms. Abigail Malone presents for follow-up to chronic right shoulder pain, she states that she dosed the meloxicam for 10-11 days, noted subtle improvement, self discontinued and has been using Tylenol.  Still noticing significant nighttime pain interfering with sleep, interference with ADLs, pain with attempting to raise her arm overhead.  Examination  with 5/5 painless internal and external rotation, isolated supraspinatus testing 5 -/5, mild pain, positive Neer's and Hawkins, remainder of provocative testing benign.  X-rays were reviewed with the patient demonstrating osseous component to her noted impingement.  We reviewed various next steps given her residual findings and radiographic features.  Plan: - Patient like to proceed with ultrasound-guided subacromial corticosteroid injection - Referral to [facility] physical therapy placed - New Rx for meloxicam 7.5 mg twice daily as needed written as an adjunct to PT - Return in 6 weeks - Suboptimal progress, pending exam, can be addressed with additional injections versus consideration of orthopedic referral  Orders: -     [place] LIMITED JOINT SPACE STRUCTURES UP RIGHT; Future -     Ambulatory referral to Physical Therapy -     Meloxicam; Take 1 tablet (7.5 mg total) by mouth 2 (two) times daily as needed for pain.  Dispense: 60 tablet; Refill: 0     Orders & Medications Meds ordered this encounter  Medications   meloxicam (MOBIC) 7.5 MG tablet    Sig: Take 1 tablet (7.5 mg total) by mouth 2 (two) times daily as needed for pain.    Dispense:  60 tablet    Refill:  0   Orders Placed This Encounter  Procedures   [place] LIMITED JOINT SPACE STRUCTURES UP RIGHT   Ambulatory referral to Physical Therapy     No follow-ups on file.     Jerrol Banana, MD, [facility]   Primary Care Sports Medicine Primary Care and Sports Medicine at [facility]

## 2023-03-29 NOTE — Assessment & Plan Note (Signed)
Abigail Malone presents for follow-up to chronic right shoulder pain, she states that she dosed the meloxicam for 10-11 days, noted subtle improvement, self discontinued and has been using Tylenol.  Still noticing significant nighttime pain interfering with sleep, interference with ADLs, pain with attempting to raise her arm overhead.  Examination with 5/5 painless internal and external rotation, isolated supraspinatus testing 5 -/5, mild pain, positive Neer's and Hawkins, remainder of provocative testing benign.  X-rays were reviewed with the patient demonstrating osseous component to her noted impingement.  We reviewed various next steps given her residual findings and radiographic features.  Plan: - Patient like to proceed with ultrasound-guided subacromial corticosteroid injection - Referral to [facility] physical therapy placed - New Rx for meloxicam 7.5 mg twice daily as needed written as an adjunct to PT - Return in 6 weeks - Suboptimal progress, pending exam, can be addressed with additional injections versus consideration of orthopedic referral

## 2023-03-29 NOTE — Patient Instructions (Signed)
You have just been given a cortisone injection to reduce pain and inflammation. After the injection you may notice immediate relief of pain as a result of the Lidocaine. It is important to rest the area of the injection for 24 to 48 hours after the injection. There is a possibility of some temporary increased discomfort and swelling for up to 72 hours until the cortisone begins to work. If you do have pain, simply rest the joint and use ice. If you can tolerate over the counter medications, you can try Tylenol for added relief per package instructions. - Relative rest x 2 days then return to normal activity - You will be contacted to schedule Abigail Malone's Physical Therapy - Use meloxicam 1-2 times / day as-needed for pain (take with food) - Return in 6 weeks

## 2023-04-08 DIAGNOSIS — M25511 Pain in right shoulder: Secondary | ICD-10-CM | POA: Diagnosis not present

## 2023-04-08 DIAGNOSIS — M7541 Impingement syndrome of right shoulder: Secondary | ICD-10-CM | POA: Diagnosis not present

## 2023-04-08 DIAGNOSIS — M25611 Stiffness of right shoulder, not elsewhere classified: Secondary | ICD-10-CM | POA: Diagnosis not present

## 2023-04-10 DIAGNOSIS — M7541 Impingement syndrome of right shoulder: Secondary | ICD-10-CM | POA: Diagnosis not present

## 2023-04-10 DIAGNOSIS — M25611 Stiffness of right shoulder, not elsewhere classified: Secondary | ICD-10-CM | POA: Diagnosis not present

## 2023-04-10 DIAGNOSIS — M25511 Pain in right shoulder: Secondary | ICD-10-CM | POA: Diagnosis not present

## 2023-04-15 DIAGNOSIS — M7541 Impingement syndrome of right shoulder: Secondary | ICD-10-CM | POA: Diagnosis not present

## 2023-04-15 DIAGNOSIS — M25611 Stiffness of right shoulder, not elsewhere classified: Secondary | ICD-10-CM | POA: Diagnosis not present

## 2023-04-15 DIAGNOSIS — M25511 Pain in right shoulder: Secondary | ICD-10-CM | POA: Diagnosis not present

## 2023-04-17 DIAGNOSIS — M25611 Stiffness of right shoulder, not elsewhere classified: Secondary | ICD-10-CM | POA: Diagnosis not present

## 2023-04-17 DIAGNOSIS — M7541 Impingement syndrome of right shoulder: Secondary | ICD-10-CM | POA: Diagnosis not present

## 2023-04-17 DIAGNOSIS — M25511 Pain in right shoulder: Secondary | ICD-10-CM | POA: Diagnosis not present

## 2023-04-22 DIAGNOSIS — M7541 Impingement syndrome of right shoulder: Secondary | ICD-10-CM | POA: Diagnosis not present

## 2023-04-22 DIAGNOSIS — M25511 Pain in right shoulder: Secondary | ICD-10-CM | POA: Diagnosis not present

## 2023-04-22 DIAGNOSIS — M25611 Stiffness of right shoulder, not elsewhere classified: Secondary | ICD-10-CM | POA: Diagnosis not present

## 2023-04-24 DIAGNOSIS — M25611 Stiffness of right shoulder, not elsewhere classified: Secondary | ICD-10-CM | POA: Diagnosis not present

## 2023-04-24 DIAGNOSIS — M25511 Pain in right shoulder: Secondary | ICD-10-CM | POA: Diagnosis not present

## 2023-04-24 DIAGNOSIS — M7541 Impingement syndrome of right shoulder: Secondary | ICD-10-CM | POA: Diagnosis not present

## 2023-04-29 DIAGNOSIS — M25611 Stiffness of right shoulder, not elsewhere classified: Secondary | ICD-10-CM | POA: Diagnosis not present

## 2023-04-29 DIAGNOSIS — M25511 Pain in right shoulder: Secondary | ICD-10-CM | POA: Diagnosis not present

## 2023-04-29 DIAGNOSIS — M7541 Impingement syndrome of right shoulder: Secondary | ICD-10-CM | POA: Diagnosis not present

## 2023-05-01 DIAGNOSIS — M25511 Pain in right shoulder: Secondary | ICD-10-CM | POA: Diagnosis not present

## 2023-05-01 DIAGNOSIS — M25611 Stiffness of right shoulder, not elsewhere classified: Secondary | ICD-10-CM | POA: Diagnosis not present

## 2023-05-01 DIAGNOSIS — M7541 Impingement syndrome of right shoulder: Secondary | ICD-10-CM | POA: Diagnosis not present

## 2023-05-07 ENCOUNTER — Encounter: Payer: Self-pay | Admitting: Family Medicine

## 2023-05-07 ENCOUNTER — Ambulatory Visit: Payer: PPO | Admitting: Family Medicine

## 2023-05-07 ENCOUNTER — Ambulatory Visit (INDEPENDENT_AMBULATORY_CARE_PROVIDER_SITE_OTHER): Payer: PPO | Admitting: Family Medicine

## 2023-05-07 VITALS — BP 152/86 | HR 76 | Ht 59.0 in | Wt 180.0 lb

## 2023-05-07 DIAGNOSIS — M7541 Impingement syndrome of right shoulder: Secondary | ICD-10-CM | POA: Diagnosis not present

## 2023-05-07 NOTE — Patient Instructions (Signed)
-   Continue exercises on a regular "maintenance" basis - Can use meloxicam 7.5 mg daily as-needed for any pain - If pain persists despite the above, contact us to schedule visit

## 2023-05-07 NOTE — Progress Notes (Signed)
     Primary Care / Sports Medicine Office Visit  Patient Information:  Patient ID: Abigail Malone, female DOB: 11-14-1950 Age: 72 y.o. MRN: [id number]   Abigail Malone is a pleasant 72 y.o. female presenting with the following:  Chief Complaint  Patient presents with   Rotator cuff impingement syndrome, righ    Pt feel pretty good     Vitals:   05/07/23 0849  BP: (!) 152/86  Pulse: 76  SpO2: 96%   Vitals:   05/07/23 0849  Weight: 180 lb (81.6 kg)  Height: 4\' 11"  (1.499 m)   Body mass index is 36.36 kg/m.  No results found.   Independent interpretation of notes and tests performed by another provider:   None  Procedures performed:   None  Pertinent History, Exam, Impression, and Recommendations:   Problem List Items Addressed This Visit       Musculoskeletal and Integument   Rotator cuff impingement syndrome, right - Primary    Returns for follow-up, had subacromial cortisone injection at last visit and she completed physical therapy course. She has no pain and performs all ADLs without issue. No need for meloxicam.  Exam with essentially full, and completely painless ROM, 5/5 painless RC strength, negative provocative testing.  Her RC impingement has resolved.  Plan: - Continue exercises on a regular "maintenance" basis - Can use meloxicam 7.5 mg daily as-needed for any pain - If pain persists despite the above, contact us to schedule visit - If noted, given her excellent response to cortisone, we would consider repeat injection        Orders & Medications Medications: No orders of the defined types were placed in this encounter.  No orders of the defined types were placed in this encounter.    No follow-ups on file.     Jerrol Banana, MD, [facility]   Primary Care Sports Medicine Primary Care and Sports Medicine at [facility]

## 2023-05-07 NOTE — Assessment & Plan Note (Signed)
Returns for follow-up, had subacromial cortisone injection at last visit and she completed physical therapy course. She has no pain and performs all ADLs without issue. No need for meloxicam.  Exam with essentially full, and completely painless ROM, 5/5 painless RC strength, negative provocative testing.  Her RC impingement has resolved.  Plan: - Continue exercises on a regular "maintenance" basis - Can use meloxicam 7.5 mg daily as-needed for any pain - If pain persists despite the above, contact us to schedule visit - If noted, given her excellent response to cortisone, we would consider repeat injection

## 2023-06-06 ENCOUNTER — Other Ambulatory Visit: Payer: Self-pay | Admitting: Family Medicine

## 2023-06-06 DIAGNOSIS — M7541 Impingement syndrome of right shoulder: Secondary | ICD-10-CM

## 2023-06-06 NOTE — Telephone Encounter (Signed)
Requested medications are due for refill today.  yes  Requested medications are on the active medications list.  yes  Last refill. 03/29/2023 #60 0 rf  Future visit scheduled.   yes  Notes to clinic.  Expired labs.    Requested Prescriptions  Pending Prescriptions Disp Refills   meloxicam (MOBIC) 7.5 MG tablet [Pharmacy Med Name: MELOXICAM 7.5MG  TABLETS] 60 tablet 0    Sig: TAKE 1 TABLET(7.5 MG) BY MOUTH TWICE DAILY AS NEEDED FOR PAIN     Analgesics:  COX2 Inhibitors Failed - 06/06/2023 10:11 AM      Failed - Manual Review: Labs are only required if the patient has taken medication for more than 8 weeks.      Failed - HGB in normal range and within 360 days    Hemoglobin  Date Value Ref Range Status  10/12/2021 11.3 (L) 12.0 - 15.0 g/dL Final  40/98/1191 47.8 11.1 - 15.9 g/dL Final         Failed - HCT in normal range and within 360 days    HCT  Date Value Ref Range Status  10/12/2021 34.9 (L) 36.0 - 46.0 % Final   Hematocrit  Date Value Ref Range Status  05/16/2021 34.5 34.0 - 46.6 % Final         Passed - Cr in normal range and within 360 days    Creatinine, Ser  Date Value Ref Range Status  12/11/2022 0.57 0.57 - 1.00 mg/dL Final         Passed - AST in normal range and within 360 days    AST  Date Value Ref Range Status  12/11/2022 16 0 - 40 IU/L Final         Passed - ALT in normal range and within 360 days    ALT  Date Value Ref Range Status  12/11/2022 13 0 - 32 IU/L Final         Passed - eGFR is 30 or above and within 360 days    GFR, Estimated  Date Value Ref Range Status  10/12/2021 >60 >60 mL/min Final    Comment:    (NOTE) Calculated using the CKD-EPI Creatinine Equation (2021)    eGFR  Date Value Ref Range Status  12/11/2022 97 >59 mL/min/1.73 Final         Passed - Patient is not pregnant      Passed - Valid encounter within last 12 months    Recent Outpatient Visits           1 month ago Rotator cuff impingement syndrome, right    [place] [facility] at [facility] Royalty, Ocie Bob, MD   2 months ago Rotator cuff impingement syndrome, right   [place] [facility] at [facility], Ocie Bob, MD   3 months ago Rotator cuff impingement syndrome, right   [facility] Care & [facility] Medicine at [facility], Ocie Bob, MD   5 months ago Primary hypertension   [place] [facility] at [facility] In[facility], [facility]   11 months ago Primary hypertension   [place] [facility] at [facility], [facility]       Future Appointments             In 1 week Duanne Limerick, MD [facility] Primary Care & Sports Medicine at [facility], [facility]   In 2  months Alfredo Martinez, MD [facility]

## 2023-06-10 DIAGNOSIS — H43813 Vitreous degeneration, bilateral: Secondary | ICD-10-CM | POA: Diagnosis not present

## 2023-06-10 DIAGNOSIS — H2513 Age-related nuclear cataract, bilateral: Secondary | ICD-10-CM | POA: Diagnosis not present

## 2023-06-10 DIAGNOSIS — H353131 Nonexudative age-related macular degeneration, bilateral, early dry stage: Secondary | ICD-10-CM | POA: Diagnosis not present

## 2023-06-13 ENCOUNTER — Ambulatory Visit (INDEPENDENT_AMBULATORY_CARE_PROVIDER_SITE_OTHER): Payer: PPO | Admitting: Family Medicine

## 2023-06-13 ENCOUNTER — Encounter: Payer: Self-pay | Admitting: Family Medicine

## 2023-06-13 ENCOUNTER — Ambulatory Visit: Payer: PPO | Admitting: Family Medicine

## 2023-06-13 VITALS — BP 124/78 | HR 58 | Ht 59.0 in | Wt 185.0 lb

## 2023-06-13 DIAGNOSIS — J301 Allergic rhinitis due to pollen: Secondary | ICD-10-CM | POA: Diagnosis not present

## 2023-06-13 DIAGNOSIS — I10 Essential (primary) hypertension: Secondary | ICD-10-CM | POA: Diagnosis not present

## 2023-06-13 MED ORDER — MONTELUKAST SODIUM 10 MG PO TABS: 10.0000 mg | ORAL_TABLET | Freq: Every day | ORAL | 1 refills | Status: DC

## 2023-06-13 MED ORDER — CARVEDILOL 3.125 MG PO TABS
ORAL_TABLET | ORAL | 1 refills | Status: DC
Start: 2023-06-13 — End: 2023-12-02

## 2023-06-13 NOTE — Progress Notes (Signed)
Date:  06/13/2023   Name:  Abigail Malone   DOB:  09-14-1950   MRN:  [id number]   Chief Complaint: Hypertension  Hypertension This is a chronic problem. The current episode started more than 1 year ago. The problem has been gradually improving since onset. The problem is controlled. Pertinent negatives include no anxiety, blurred vision, chest pain, headaches, malaise/fatigue, neck pain, orthopnea, palpitations, peripheral edema, PND, shortness of breath or sweats. There are no associated agents to hypertension. There are no known risk factors for coronary artery disease. Past treatments include beta blockers and alpha 1 blockers. The current treatment provides moderate improvement. There are no compliance problems.  There is no history of CAD/MI or CVA. There is no history of chronic renal disease, a hypertension causing med or renovascular disease.  URI  This is a chronic problem. The current episode started more than 1 year ago. The problem has been gradually improving. There has been no fever. Pertinent negatives include no chest pain, congestion, coughing, ear pain, headaches, neck pain, plugged ear sensation, rhinorrhea, sinus pain, sneezing, sore throat or wheezing. Treatments tried: singulair. The treatment provided mild relief.    Lab Results  Component Value Date   NA 145 (H) 12/11/2022   K 4.0 12/11/2022   CO2 26 12/11/2022   GLUCOSE 87 12/11/2022   BUN 7 (L) 12/11/2022   CREATININE 0.57 12/11/2022   CALCIUM 9.4 12/11/2022   EGFR 97 12/11/2022   GFRNONAA >60 10/12/2021   Lab Results  Component Value Date   CHOL 197 12/11/2022   HDL 67 12/11/2022   LDLCALC 115 (H) 12/11/2022   TRIG 82 12/11/2022   No results found for: "TSH" No results found for: "HGBA1C" Lab Results  Component Value Date   WBC 7.6 10/12/2021   HGB 11.3 (L) 10/12/2021   HCT 34.9 (L) 10/12/2021   MCV 80.4 10/12/2021   PLT 257 10/12/2021   Lab Results  Component Value Date   ALT 13 12/11/2022    AST 16 12/11/2022   ALKPHOS 105 12/11/2022   BILITOT 0.6 12/11/2022   No results found for: "25OHVITD2", "25OHVITD3", "VD25OH"   Review of Systems  Constitutional:  Negative for malaise/fatigue.  HENT:  Negative for congestion, ear pain, nosebleeds, rhinorrhea, sinus pressure, sinus pain, sneezing and sore throat.   Eyes:  Negative for blurred vision.  Respiratory:  Negative for cough, choking, chest tightness, shortness of breath, wheezing and stridor.   Cardiovascular:  Negative for chest pain, palpitations, orthopnea, leg swelling and PND.  Musculoskeletal:  Negative for neck pain.  Neurological:  Negative for headaches.    Patient Active Problem List   Diagnosis Date Noted   Rotator cuff impingement syndrome, right 02/25/2023   Obesity with body mass index (BMI) of 30.0 to 39.9 01/01/2020   Cardiac syncope 05/18/2019   Hyperlipidemia, mixed 05/18/2019   Mixed stress and urge urinary incontinence 04/01/2018   Abnormal uterine bleeding (AUB) 05/15/2016   Right knee pain 07/26/2015   Allergic rhinitis, seasonal 12/09/2014   Menopausal symptoms 08/05/2014   HTN (hypertension) 11/18/2013   Anemia, unspecified 11/18/2013    Allergies  Allergen Reactions   Amlodipine     Leg cramping    Past Surgical History:  Procedure Laterality Date   BREAST BIOPSY Right 09/07/2021   stereo calcs, ribbon clip, path pending.   BREAST BIOPSY WITH RADIO FREQUENCY LOCALIZER Right 10/18/2021   Procedure: BREAST BIOPSY WITH RADIO FREQUENCY LOCALIZER;  Surgeon: Campbell Lerner, MD;  Location: [facility] ORS;  Service: General;  Laterality: Right;   BREAST EXCISIONAL BIOPSY Right 2023   COLONOSCOPY  ~2018    Social History   Tobacco Use   Smoking status: Former    Current packs/day: 0.00    Average packs/day: 0.5 packs/day for 8.0 years (4.0 ttl pk-yrs)    Types: Cigarettes    Start date: 12/22/1998    Quit date: 12/22/2006    Years since quitting: 16.4    Passive exposure: Never    Smokeless tobacco: Never  Vaping Use   Vaping status: Never Used  Substance Use Topics   Alcohol use: Not Currently   Drug use: No     Medication list has been reviewed and updated.  Current Meds  Medication Sig   acetaminophen (TYLENOL) 500 MG tablet Take 1,000 mg by mouth every 6 (six) hours as needed for moderate pain.   carvedilol (COREG) 3.125 MG tablet TAKE 1 TABLET(3.125 MG) BY MOUTH TWICE DAILY WITH A MEAL   Cholecalciferol (VITAMIN D) 50 MCG (2000 UT) tablet Take 2,000 Units by mouth daily.   Coenzyme Q10 200 MG capsule Take 200 mg by mouth daily.   conjugated estrogens (PREMARIN) vaginal cream Estrogen Cream Instruction Discard applicator Apply pea sized amount to tip of finger to urethra before bed. Wash hands well after application. Use Monday, Wednesday and Friday   ibuprofen (ADVIL) 800 MG tablet TAKE 1 TABLET(800 MG) BY MOUTH EVERY 8 HOURS AS NEEDED   meloxicam (MOBIC) 7.5 MG tablet TAKE 1 TABLET(7.5 MG) BY MOUTH TWICE DAILY AS NEEDED FOR PAIN   montelukast (SINGULAIR) 10 MG tablet Take 1 tablet (10 mg total) by mouth at bedtime.   nystatin cream (MYCOSTATIN) Apply 1 application. topically 2 (two) times daily.   Omega-3 Fatty Acids (FISH OIL) 1200 MG CAPS Take 1,200 mg by mouth daily.   Vibegron (GEMTESA) 75 MG TABS Take 1 tablet by mouth daily. urology       06/13/2023    3:58 PM 05/07/2023    8:58 AM 12/11/2022    9:00 AM 06/12/2022    9:06 AM  GAD 7 : Generalized Anxiety Score  Nervous, Anxious, on Edge 0 0 0 0  Control/stop worrying 0 0 0 0  Worry too much - different things 0 0 0 0  Trouble relaxing 0 0 0 0  Restless 0 0 0 0  Easily annoyed or irritable 0 0 0 0  Afraid - awful might happen 0 0 0 0  Total GAD 7 Score 0 0 0 0  Anxiety Difficulty Not difficult at all Not difficult at all Not difficult at all Not difficult at all       06/13/2023    3:58 PM 05/07/2023    8:58 AM 02/20/2023    8:52 AM  Depression screen PHQ 2/9  Decreased Interest 0 0 0   Down, Depressed, Hopeless 0 0 0  PHQ - 2 Score 0 0 0  Altered sleeping 1 0 0  Tired, decreased energy 1 0 0  Change in appetite 0 0 0  Feeling bad or failure about yourself  0 0 0  Trouble concentrating 0 0 0  Moving slowly or fidgety/restless 0 0 0  Suicidal thoughts 0 0 0  PHQ-9 Score 2 0 0  Difficult doing work/chores Not difficult at all Not difficult at all Not difficult at all    BP Readings from Last 3 Encounters:  06/13/23 124/78  05/07/23 (!) 152/86  03/29/23 (!) 144/98    Physical Exam Vitals  and nursing note reviewed. Exam conducted with a chaperone present.  Constitutional:      General: She is not in acute distress.    Appearance: She is not diaphoretic.  HENT:     Head: Normocephalic and atraumatic.     Right Ear: Tympanic membrane and external ear normal.     Left Ear: Tympanic membrane and external ear normal.     Nose: Nose normal.  Eyes:     General:        Right eye: No discharge.        Left eye: No discharge.     Conjunctiva/sclera: Conjunctivae normal.     Pupils: Pupils are equal, round, and reactive to light.  Neck:     Thyroid: No thyromegaly.     Vascular: No JVD.  Cardiovascular:     Rate and Rhythm: Normal rate and regular rhythm.     Heart sounds: Normal heart sounds, S1 normal and S2 normal. No murmur heard.    No systolic murmur is present.     No diastolic murmur is present.     No friction rub. No gallop. No S3 or S4 sounds.  Pulmonary:     Effort: Pulmonary effort is normal.     Breath sounds: Normal breath sounds.  Abdominal:     General: Bowel sounds are normal.     Palpations: Abdomen is soft. There is no mass.     Tenderness: There is no abdominal tenderness. There is no guarding.  Musculoskeletal:        General: Normal range of motion.     Cervical back: Normal range of motion and neck supple.     Right lower leg: 1+ Pitting Edema present.     Left lower leg: 1+ Pitting Edema present.  Lymphadenopathy:     Cervical: No  cervical adenopathy.  Skin:    General: Skin is warm and dry.  Neurological:     Mental Status: She is alert.     Deep Tendon Reflexes: Reflexes are normal and symmetric.     Wt Readings from Last 3 Encounters:  06/13/23 185 lb (83.9 kg)  05/07/23 180 lb (81.6 kg)  03/29/23 180 lb (81.6 kg)    BP 124/78   Pulse (!) 58   Ht 4\' 11"  (1.499 m)   Wt 185 lb (83.9 kg)   SpO2 98%   BMI 37.37 kg/m   Assessment and Plan: 1. Primary hypertension Chronic.  Controlled.  Stable.  Blood pressure today is 124/78.  Asymptomatic.  Tolerating medications well.  Without side effects.  Will continue carvedilol 3.1251 tablet twice a day.  Review of previous labs are acceptable and we will repeat labs in 6 months with 6 months check fasting. - carvedilol (COREG) 3.125 MG tablet; TAKE 1 TABLET(3.125 MG) BY MOUTH TWICE DAILY WITH A MEAL  Dispense: 180 tablet; Refill: 1  2. Seasonal allergic rhinitis due to pollen Chronic.  Controlled.  Stable.  Symptomatic relief which also helps her with rest at night of montelukast 10 mg nightly.  Will continue and may add Claritin on an as-needed basis or another nonsedating antihistamine. - montelukast (SINGULAIR) 10 MG tablet; Take 1 tablet (10 mg total) by mouth at bedtime.  Dispense: 90 tablet; Refill: 1     Elizabeth Sauer, MD

## 2023-07-02 ENCOUNTER — Other Ambulatory Visit: Payer: Self-pay

## 2023-07-02 DIAGNOSIS — Z1231 Encounter for screening mammogram for malignant neoplasm of breast: Secondary | ICD-10-CM

## 2023-07-05 ENCOUNTER — Other Ambulatory Visit: Payer: Self-pay | Admitting: Family Medicine

## 2023-07-05 DIAGNOSIS — M7541 Impingement syndrome of right shoulder: Secondary | ICD-10-CM

## 2023-07-05 NOTE — Telephone Encounter (Signed)
Requested medication (s) are due for refill today: yes  Requested medication (s) are on the active medication list: yes  Last refill:  06/07/23 #60 0 refills   Future visit scheduled: yes in 4 month  Notes to clinic:  no refills remain  do you want to refill rx?     Requested Prescriptions  Pending Prescriptions Disp Refills   meloxicam (MOBIC) 7.5 MG tablet [Pharmacy Med Name: MELOXICAM 7.5MG  TABLETS] 60 tablet 0    Sig: TAKE 1 TABLET(7.5 MG) BY MOUTH TWICE DAILY AS NEEDED FOR PAIN     Analgesics:  COX2 Inhibitors Failed - 07/05/2023  1:11 PM      Failed - Manual Review: Labs are only required if the patient has taken medication for more than 8 weeks.      Failed - HGB in normal range and within 360 days    Hemoglobin  Date Value Ref Range Status  10/12/2021 11.3 (L) 12.0 - 15.0 g/dL Final  62/13/0865 78.4 11.1 - 15.9 g/dL Final         Failed - HCT in normal range and within 360 days    HCT  Date Value Ref Range Status  10/12/2021 34.9 (L) 36.0 - 46.0 % Final   Hematocrit  Date Value Ref Range Status  05/16/2021 34.5 34.0 - 46.6 % Final         Passed - Cr in normal range and within 360 days    Creatinine, Ser  Date Value Ref Range Status  12/11/2022 0.57 0.57 - 1.00 mg/dL Final         Passed - AST in normal range and within 360 days    AST  Date Value Ref Range Status  12/11/2022 16 0 - 40 IU/L Final         Passed - ALT in normal range and within 360 days    ALT  Date Value Ref Range Status  12/11/2022 13 0 - 32 IU/L Final         Passed - eGFR is 30 or above and within 360 days    GFR, Estimated  Date Value Ref Range Status  10/12/2021 >60 >60 mL/min Final    Comment:    (NOTE) Calculated using the CKD-EPI Creatinine Equation (2021)    eGFR  Date Value Ref Range Status  12/11/2022 97 >59 mL/min/1.73 Final         Passed - Patient is not pregnant      Passed - Valid encounter within last 12 months    Recent Outpatient Visits           3  weeks ago Primary hypertension   [place] [facility] In[facility], MD   1 month ago Rotator cuff impingement syndrome, right   [place] [facility], Ocie Bob, MD   3 months ago Rotator cuff impingement syndrome, right   [facility], Ocie Bob, MD   4 months ago Rotator cuff impingement syndrome, right   [facility], Ocie Bob, MD   6 months ago Primary hypertension    [facility] In[facility], MD       Future Appointments             In 1 month MacDiarmid, Lorin Picket, MD [facility]  [place]   In 4 months Yetta Barre, Vanita Panda, MD [facility] at [facility]

## 2023-07-05 NOTE — Telephone Encounter (Signed)
Would you like to refill this for patient? Thank you. JM

## 2023-07-09 ENCOUNTER — Other Ambulatory Visit: Payer: Self-pay

## 2023-07-09 DIAGNOSIS — M7541 Impingement syndrome of right shoulder: Secondary | ICD-10-CM

## 2023-07-09 MED ORDER — MELOXICAM 7.5 MG PO TABS: 7.5000 mg | ORAL_TABLET | Freq: Two times a day (BID) | ORAL | 2 refills | Status: DC

## 2023-07-09 NOTE — Telephone Encounter (Signed)
 Completed. JM

## 2023-08-05 ENCOUNTER — Encounter: Payer: Self-pay | Admitting: Urology

## 2023-08-05 ENCOUNTER — Ambulatory Visit: Payer: PPO | Admitting: Urology

## 2023-08-05 VITALS — HR 80 | Ht 59.0 in | Wt 169.0 lb

## 2023-08-05 DIAGNOSIS — N3946 Mixed incontinence: Secondary | ICD-10-CM

## 2023-08-05 LAB — MICROSCOPIC EXAMINATION
Bacteria, UA: NONE SEEN
RBC, Urine: NONE SEEN /[HPF] (ref 0–2)

## 2023-08-05 LAB — URINALYSIS, COMPLETE
Bilirubin, UA: NEGATIVE
Glucose, UA: NEGATIVE
Ketones, UA: NEGATIVE
Leukocytes,UA: NEGATIVE
Nitrite, UA: NEGATIVE
Protein,UA: NEGATIVE
RBC, UA: NEGATIVE
Specific Gravity, UA: 1.015 (ref 1.005–1.030)
Urobilinogen, Ur: 0.2 mg/dL (ref 0.2–1.0)
pH, UA: 7 (ref 5.0–7.5)

## 2023-08-05 MED ORDER — SOLIFENACIN SUCCINATE 5 MG PO TABS: 5.0000 mg | ORAL_TABLET | Freq: Every day | ORAL | 11 refills | Status: DC

## 2023-08-05 NOTE — Progress Notes (Signed)
 08/05/2023 10:10 AM   Abigail Malone 08-Feb-1951 [id number]  Referring provider: Joshua Cathryne BROCKS, MD [address] [place],  [place] [address]  Chief Complaint  Patient presents with   Follow-up   Urinary Incontinence    1 year follow-up    HPI: Abigail Malone: Mixed urinary incontinence who failed oxybutynin  percutaneous tibial nerve stimulation vaginal estrogen cream and did not want to do physical therapy.  Reported urge incontinence worse and stress incontinence.  Last postvoid residual was 150 mL.  Was given Gemtesa .  Has failed Myrbetriq    Today Prior to going on Gemtesa  urge component was much more severe than stress incontinence.  She would wear 2-3 pads a day especially if she went out in public.  She is almost completely dry on Gemtesa  and not even having stress incontinence.  She is very pleased.  She has never had bedwetting.  No infection history.  Medically not infected.  Frequency stable.  See in 1 year Gemtesa   Today In the last 3 months the patient's incontinence has worsened.  She is wearing 2-3 pads a day that are sometimes damp but if she gets caught in public they could be soaked.  She has a little bit of dampness while she is sleeping.  Clinically not infected.  She denies stress incontinence        PMH: Past Medical History:  Diagnosis Date   Abnormal uterine bleeding (AUB) 05/15/2016   Allergic rhinitis, seasonal 12/09/2014   Anemia, unspecified 11/18/2013   Atypical ductal hyperplasia of right breast 09/19/2021   HTN (hypertension) 11/18/2013   Hypertension    Menopausal symptoms 08/05/2014   Right knee pain 07/26/2015    Surgical History: Past Surgical History:  Procedure Laterality Date   BREAST BIOPSY Right 09/07/2021   stereo calcs, ribbon clip, path pending.   BREAST BIOPSY WITH RADIO FREQUENCY LOCALIZER Right 10/18/2021   Procedure: BREAST BIOPSY WITH RADIO FREQUENCY LOCALIZER;  Surgeon: Lane Shope, MD;  Location: [facility] ORS;   Service: General;  Laterality: Right;   BREAST EXCISIONAL BIOPSY Right 2023   COLONOSCOPY  ~2018    Home Medications:  Allergies as of 08/05/2023       Reactions   Amlodipine    Leg cramping        Medication List        Accurate as of August 05, 2023 10:10 AM. If you have any questions, ask your nurse or doctor.          acetaminophen  500 MG tablet Commonly known as: TYLENOL  Take 1,000 mg by mouth every 6 (six) hours as needed for moderate pain.   carvedilol  3.125 MG tablet Commonly known as: COREG  TAKE 1 TABLET(3.125 MG) BY MOUTH TWICE DAILY WITH A MEAL   Coenzyme Q10 200 MG capsule Take 200 mg by mouth daily.   Fish Oil 1200 MG Caps Take 1,200 mg by mouth daily.   Gemtesa  75 MG Tabs Generic drug: Vibegron  Take 1 tablet by mouth daily. urology   ibuprofen  800 MG tablet Commonly known as: ADVIL  TAKE 1 TABLET(800 MG) BY MOUTH EVERY 8 HOURS AS NEEDED   meloxicam  7.5 MG tablet Commonly known as: MOBIC  Take 1 tablet (7.5 mg total) by mouth in the morning and at bedtime.   montelukast  10 MG tablet Commonly known as: SINGULAIR  Take 1 tablet (10 mg total) by mouth at bedtime.   nystatin  cream Commonly known as: MYCOSTATIN  Apply 1 application. topically 2 (two) times daily.   Premarin  vaginal cream Generic  drug: conjugated estrogens  Estrogen Cream Instruction Discard applicator Apply pea sized amount to tip of finger to urethra before bed. Wash hands well after application. Use Monday, Wednesday and Friday   Vitamin D 50 MCG (2000 UT) tablet Take 2,000 Units by mouth daily.        Allergies:  Allergies  Allergen Reactions   Amlodipine     Leg cramping    Family History: Family History  Problem Relation Age of Onset   Cancer Mother        Liver   Stroke Mother    Hypertension Mother    Hematuria Mother    Liver cancer Mother    Cancer Father    Prostate cancer Father    Kidney failure Brother    Breast cancer Neg Hx     Social  History:  reports that she quit smoking about 16 years ago. Her smoking use included cigarettes. She started smoking about 24 years ago. She has a 4 pack-year smoking history. She has never been exposed to tobacco smoke. She has never used smokeless tobacco. She reports that she does not currently use alcohol. She reports that she does not use drugs.  ROS:                                        Physical Exam: There were no vitals taken for this visit.  Constitutional:  Alert and oriented, No acute distress. HEENT: [place] AT, moist mucus membranes.  Trachea midline, no masses.   Laboratory Data: Lab Results  Component Value Date   WBC 7.6 10/12/2021   HGB 11.3 (L) 10/12/2021   HCT 34.9 (L) 10/12/2021   MCV 80.4 10/12/2021   PLT 257 10/12/2021    Lab Results  Component Value Date   CREATININE 0.57 12/11/2022    No results found for: PSA  No results found for: TESTOSTERONE  No results found for: HGBA1C  Urinalysis    Component Value Date/Time   COLORURINE YELLOW 02/11/2016 1212   APPEARANCEUR Clear 08/06/2022 1047   LABSPEC 1.015 02/11/2016 1212   PHURINE 7.5 02/11/2016 1212   GLUCOSEU Negative 08/06/2022 1047   HGBUR NEGATIVE 02/11/2016 1212   BILIRUBINUR Negative 08/06/2022 1047   KETONESUR TRACE (A) 02/11/2016 1212   PROTEINUR Negative 08/06/2022 1047   PROTEINUR NEGATIVE 02/11/2016 1212   UROBILINOGEN 0.2 10/20/2021 1631   NITRITE Negative 08/06/2022 1047   NITRITE NEGATIVE 02/11/2016 1212   LEUKOCYTESUR Negative 08/06/2022 1047    Pertinent Imaging: Urine reviewed and sent for culture  Assessment & Plan: Reevaluate on Vesicare  5 mg 30 x 11.  I reviewed urodynamics with her with the option of ordering next visit if she fails.  Do not think she particularly wants to have urodynamics but is willing to take the Vesicare .  Call if urine culture positive  1. Mixed incontinence (Primary)  - Urinalysis, Complete   No follow-ups on  file.  Abigail DELENA Elizabeth, MD  [facility] [address] [place], [place] [address] [phone]

## 2023-08-07 LAB — CULTURE, URINE COMPREHENSIVE

## 2023-08-20 ENCOUNTER — Ambulatory Visit
Admission: RE | Admit: 2023-08-20 | Discharge: 2023-08-20 | Disposition: A | Discharge status: Home / Self Care | Payer: PPO | Source: Ambulatory Visit | Attending: Surgery | Admitting: Surgery

## 2023-08-20 DIAGNOSIS — Z1231 Encounter for screening mammogram for malignant neoplasm of breast: Secondary | ICD-10-CM | POA: Insufficient documentation

## 2023-08-22 NOTE — Progress Notes (Signed)
 Surgical Clinic Progress/Follow-up Note   HPI:  73 y.o. Female presents to clinic for follow-up.  She had ADH, and excisional biopsy.  Patient reports just minor lingering discomfort following her mammogram.  No other breast issues.  Made her aware of the good report of her most recent imaging.    She had declined endocrine therapy.  Review of Systems:  Constitutional: denies fever/chills  ENT: denies sore throat, hearing problems  Respiratory: denies shortness of breath, wheezing  Cardiovascular: denies chest pain, palpitations  Gastrointestinal: denies abdominal pain, N/V, or diarrhea/and bowel function as per interval history Skin: Denies any other rashes or skin discolorations as per interval history  Vital Signs:  BP (!) 166/78   Pulse 70   Temp 98.2 F (36.8 C)   Ht 4' 11 (1.499 m)   Wt 180 lb (81.6 kg)   SpO2 99%   BMI 36.36 kg/m    Physical Exam:  Constitutional:  -- Norma body habitus  -- Awake, alert, and oriented x3  Pulmonary:  -- Breathing non-labored at rest Cardiovascular:  -- Well-perfused Gastrointestinal:  -- Soft and non-distended, non-tender. Breast: Caryl Lyn present as chaperone: -- Post-surgical scar is clean and soft and nontender without any adjacent firmness, suspicious nodularity or density.   -- No other dominant nor suspicious breast masses or nodularity present in either breast.  No evidence of skin changes, nipple skin inflammation.  No dimpling.  Fluid soft without nodularity or breast tissue density. Musculoskeletal / Integumentary:  -- Wounds or skin discoloration: None appreciated -- Extremities: B/L UE and LE FROM, hands and feet warm, no edema   Imaging:   CLINICAL DATA:  Screening.   EXAM: DIGITAL SCREENING BILATERAL MAMMOGRAM WITH TOMOSYNTHESIS AND CAD   TECHNIQUE: Bilateral screening digital craniocaudal and mediolateral oblique mammograms were obtained. Bilateral screening digital breast tomosynthesis was performed. The  images were evaluated with computer-aided detection.   COMPARISON:  Previous exam(s).   ACR Breast Density Category b: There are scattered areas of fibroglandular density.   FINDINGS: There are no findings suspicious for malignancy.   IMPRESSION: No mammographic evidence of malignancy. A result letter of this screening mammogram will be mailed directly to the patient.   RECOMMENDATION: Screening mammogram in one year. (Code:SM-B-01Y)   BI-RADS CATEGORY  1: Negative.     Electronically Signed   By: Delon Music M.D.   On: 08/21/2023 13:14  Assessment:  73 y.o. yo Female with a problem list including...  Patient Active Problem List   Diagnosis Date Noted   Rotator cuff impingement syndrome, right 02/25/2023   Atypical ductal hyperplasia of right breast 09/19/2021   Obesity with body mass index (BMI) of 30.0 to 39.9 01/01/2020   Cardiac syncope 05/18/2019   Hyperlipidemia, mixed 05/18/2019   Mixed stress and urge urinary incontinence 04/01/2018   Abnormal uterine bleeding (AUB) 05/15/2016   Right knee pain 07/26/2015   Allergic rhinitis, seasonal 12/09/2014   Menopausal symptoms 08/05/2014   HTN (hypertension) 11/18/2013   Anemia, unspecified 11/18/2013    presents to clinic for follow-up evaluation of right breast ADH, status post excision October 18, 2021, doing well w/o issues.  Plan:              - return to clinic annually with screening imaging or as needed, instructed to call office if any questions or concerns, gave options of following up with PCP, she prefers to follow-up here for clinical exam. Anticipating resumption of annual screening, with annual breast exam for history of  ADH. All of the above recommendations were discussed with the patient, and all of patient's questions were answered to her expressed satisfaction.  These notes generated with voice recognition software. I apologize for typographical errors.  Honor Leghorn, MD, FACS [place]:  [facility] General [facility] - Partnering for exceptional care. Office: [phone]

## 2023-08-29 ENCOUNTER — Encounter: Payer: Self-pay | Admitting: Surgery

## 2023-08-29 ENCOUNTER — Ambulatory Visit: Payer: PPO | Admitting: Surgery

## 2023-08-29 VITALS — BP 166/78 | HR 70 | Temp 98.2°F | Ht 59.0 in | Wt 180.0 lb

## 2023-08-29 DIAGNOSIS — N6091 Unspecified benign mammary dysplasia of right breast: Secondary | ICD-10-CM | POA: Diagnosis not present

## 2023-08-29 NOTE — Patient Instructions (Addendum)
 Patient will be asked to return to the office in one year with a bilateral screening mammogram.  We will send you a letter about these appointments.    Continue self breast exams. Call office for any new breast issues or concerns.   Breast Self-Awareness Breast self-awareness is knowing how your breasts look and feel. You need to: Check your breasts on a regular basis. Tell your doctor about any changes. Become familiar with the look and feel of your breasts. This can help you catch a breast problem while it is still small and can be treated. You should do breast self-exams even if you have breast implants. What you need: A mirror. A well-lit room. A pillow or other soft object. How to do a breast self-exam Follow these steps to do a breast self-exam: Look for changes  Take off all the clothes above your waist. Stand in front of a mirror in a room with good lighting. Put your hands down at your sides. Compare your breasts in the mirror. Look for any difference between them, such as: A difference in shape. A difference in size. Wrinkles, dips, and bumps in one breast and not the other. Look at each breast for changes in the skin, such as: Redness. Scaly areas. Skin that has gotten thicker. Dimpling. Open sores (ulcers). Look for changes in your nipples, such as: Fluid coming out of a nipple. Fluid around a nipple. Bleeding. Dimpling. Redness. A nipple that looks pushed in (retracted), or that has changed position. Feel for changes Lie on your back. Feel each breast. To do this: Pick a breast to feel. Place a pillow under the shoulder closest to that breast. Put the arm closest to that breast behind your head. Feel the nipple area of that breast using the hand of your other arm. Feel the area with the pads of your three middle fingers by making small circles with your fingers. Use light, medium, and firm pressure. Continue the overlapping circles, moving downward over the  breast. Keep making circles with your fingers. Stop when you feel your ribs. Start making circles with your fingers again, this time going upward until you reach your collarbone. Then, make circles outward across your breast and into your armpit area. Squeeze your nipple. Check for discharge and lumps. Repeat these steps to check your other breast. Sit or stand in the tub or shower. With soapy water on your skin, feel each breast the same way you did when you were lying down. Write down what you find Writing down what you find can help you remember what to tell your doctor. Write down: What is normal for each breast. Any changes you find in each breast. These include: The kind of changes you find. A tender or painful breast. Any lump you find. Write down its size and where it is. When you last had your monthly period (menstrual cycle). General tips If you are breastfeeding, the best time to check your breasts is after you feed your baby or after you use a breast pump. If you get monthly bleeding, the best time to check your breasts is 5-7 days after your monthly cycle ends. With time, you will become comfortable with the self-exam. You will also start to know if there are changes in your breasts. Contact a doctor if: You see a change in the shape or size of your breasts or nipples. You see a change in the skin of your breast or nipples, such as red or scaly skin.  You have fluid coming from your nipples that is not normal. You find a new lump or thick area. You have breast pain. You have any concerns about your breast health. Summary Breast self-awareness includes looking for changes in your breasts and feeling for changes within your breasts. You should do breast self-awareness in front of a mirror in a well-lit room. If you get monthly periods (menstrual cycles), the best time to check your breasts is 5-7 days after your period ends. Tell your doctor about any changes you see in your  breasts. Changes include changes in size, changes on the skin, painful or tender breasts, or fluid from your nipples that is not normal. This information is not intended to replace advice given to you by your health care provider. Make sure you discuss any questions you have with your health care provider. Document Revised: 12/14/2021 Document Reviewed: 05/11/2021 Elsevier Patient Education  2024 [employer].

## 2023-09-30 ENCOUNTER — Ambulatory Visit: Payer: PPO | Admitting: Urology

## 2023-09-30 DIAGNOSIS — N3946 Mixed incontinence: Secondary | ICD-10-CM

## 2023-09-30 LAB — URINALYSIS, COMPLETE
Bilirubin, UA: NEGATIVE
Glucose, UA: NEGATIVE
Ketones, UA: NEGATIVE
Leukocytes,UA: NEGATIVE
Nitrite, UA: NEGATIVE
Protein,UA: NEGATIVE
RBC, UA: NEGATIVE
Specific Gravity, UA: 1.02 (ref 1.005–1.030)
Urobilinogen, Ur: 0.2 mg/dL (ref 0.2–1.0)
pH, UA: 6.5 (ref 5.0–7.5)

## 2023-09-30 LAB — MICROSCOPIC EXAMINATION

## 2023-09-30 MED ORDER — TOLTERODINE TARTRATE ER 4 MG PO CP24: 4.0000 mg | ORAL_CAPSULE | Freq: Every day | ORAL | 11 refills | Status: DC

## 2023-09-30 NOTE — Progress Notes (Signed)
 09/30/2023 2:52 PM   Abigail Malone 1950/11/20 [id number]  Referring provider: Duanne Limerick, MD [address] [place],  [place] [address]  Chief Complaint  Patient presents with   Follow-up    HPI: Abigail Malone: Mixed urinary incontinence who failed oxybutynin percutaneous tibial nerve stimulation vaginal estrogen cream and did not want to do physical therapy.  Reported urge incontinence worse and stress incontinence.  Last postvoid residual was 150 mL.  Was given Gemtesa.  Has failed Myrbetriq   Today Prior to going on Gemtesa urge component was much more severe than stress incontinence.  She would wear 2-3 pads a day especially if she went out in public.  She is almost completely dry on Gemtesa and not even having stress incontinence.  She is very pleased.  She has never had bedwetting.  No infection history.  Medically not infected.  Frequency stable.  See in 1 year Gemtesa   Today In the last 3 months the patient's incontinence has worsened.  She is wearing 2-3 pads a day that are sometimes damp but if she gets caught in public they could be soaked.  She has a little bit of dampness while she is sleeping.  Clinically not infected.  She denies stress incontinence    reevaluate on Vesicare 5 mg 30 x 11.  I reviewed urodynamics with her with the option of ordering next visit if she fails.  Do not think she particularly wants to have urodynamics but is willing to take the Vesicare.  Call if urine culture positive   Today Frequency stable.  Last culture negative.  She has failed Vesicare.  Clinically not infected.  Currently she has urge incontinence wearing at least 3 pads a day often damp.  She might has small-volume bedwetting at times.  Voids every 4 hours gets up twice at night.  Flow was good.    PMH: Past Medical History:  Diagnosis Date   Abnormal uterine bleeding (AUB) 05/15/2016   Allergic rhinitis, seasonal 12/09/2014   Anemia, unspecified 11/18/2013    Atypical ductal hyperplasia of right breast 09/19/2021   HTN (hypertension) 11/18/2013   Hypertension    Menopausal symptoms 08/05/2014   Right knee pain 07/26/2015    Surgical History: Past Surgical History:  Procedure Laterality Date   BREAST BIOPSY Right 09/07/2021   stereo calcs, ribbon clip, path pending.   BREAST BIOPSY WITH RADIO FREQUENCY LOCALIZER Right 10/18/2021   Procedure: BREAST BIOPSY WITH RADIO FREQUENCY LOCALIZER;  Surgeon: Campbell Lerner, MD;  Location: [facility] ORS;  Service: General;  Laterality: Right;   BREAST EXCISIONAL BIOPSY Right 2023   COLONOSCOPY  ~2018    Home Medications:  Allergies as of 09/30/2023       Reactions   Amlodipine    Leg cramping        Medication List        Accurate as of September 30, 2023  2:52 PM. If you have any questions, ask your nurse or doctor.          acetaminophen 500 MG tablet Commonly known as: TYLENOL Take 650 mg by mouth every 6 (six) hours as needed for moderate pain (pain score 4-6).   carvedilol 3.125 MG tablet Commonly known as: COREG TAKE 1 TABLET(3.125 MG) BY MOUTH TWICE DAILY WITH A MEAL   Coenzyme Q10 200 MG capsule Take 200 mg by mouth daily.   Fish Oil 1200 MG Caps Take 1,200 mg by mouth daily.   ibuprofen 800 MG tablet Commonly  known as: ADVIL TAKE 1 TABLET(800 MG) BY MOUTH EVERY 8 HOURS AS NEEDED   meloxicam 7.5 MG tablet Commonly known as: MOBIC Take 1 tablet (7.5 mg total) by mouth in the morning and at bedtime.   montelukast 10 MG tablet Commonly known as: SINGULAIR Take 1 tablet (10 mg total) by mouth at bedtime.   nystatin cream Commonly known as: MYCOSTATIN Apply 1 application. topically 2 (two) times daily.   Premarin vaginal cream Generic drug: conjugated estrogens Estrogen Cream Instruction Discard applicator Apply pea sized amount to tip of finger to urethra before bed. Wash hands well after application. Use Monday, Wednesday and Friday   solifenacin 5 MG  tablet Commonly known as: VESICARE Take 1 tablet (5 mg total) by mouth daily.   Vitamin D 50 MCG (2000 UT) tablet Take 2,000 Units by mouth daily.        Allergies:  Allergies  Allergen Reactions   Amlodipine     Leg cramping    Family History: Family History  Problem Relation Age of Onset   Cancer Mother        Liver   Stroke Mother    Hypertension Mother    Hematuria Mother    Liver cancer Mother    Cancer Father    Prostate cancer Father    Kidney failure Brother    Breast cancer Neg Hx     Social History:  reports that she quit smoking about 16 years ago. Her smoking use included cigarettes. She started smoking about 24 years ago. She has a 4 pack-year smoking history. She has never been exposed to tobacco smoke. She has never used smokeless tobacco. She reports that she does not currently use alcohol. She reports that she does not use drugs.  ROS:                                        Physical Exam: There were no vitals taken for this visit.  Constitutional:  Alert and oriented, No acute distress. HEENT: [place] AT, moist mucus membranes.  Trachea midline, no masses.   Laboratory Data: Lab Results  Component Value Date   WBC 7.6 10/12/2021   HGB 11.3 (L) 10/12/2021   HCT 34.9 (L) 10/12/2021   MCV 80.4 10/12/2021   PLT 257 10/12/2021    Lab Results  Component Value Date   CREATININE 0.57 12/11/2022    No results found for: "PSA"  No results found for: "TESTOSTERONE"  No results found for: "HGBA1C"  Urinalysis    Component Value Date/Time   COLORURINE YELLOW 02/11/2016 1212   APPEARANCEUR Clear 08/05/2023 1022   LABSPEC 1.015 02/11/2016 1212   PHURINE 7.5 02/11/2016 1212   GLUCOSEU Negative 08/05/2023 1022   HGBUR NEGATIVE 02/11/2016 1212   BILIRUBINUR Negative 08/05/2023 1022   KETONESUR TRACE (A) 02/11/2016 1212   PROTEINUR Negative 08/05/2023 1022   PROTEINUR NEGATIVE 02/11/2016 1212   UROBILINOGEN 0.2 10/20/2021  1631   NITRITE Negative 08/05/2023 1022   NITRITE NEGATIVE 02/11/2016 1212   LEUKOCYTESUR Negative 08/05/2023 1022    Pertinent Imaging: Urine negative.  Assessment & Plan: Patient did not want to have urodynamics.  I discussed Botox and InterStim with her I was not surprised she is not interested in either 1.  We have reached the end of the treatment pathway.  I asked her to stop Vesicare due to lack of efficacy and significant sleepiness.  Recognize limitations she wants to try Detrol LA 4 mg 3 x 11.  She will stop it after a month or less she does very well.  She does well I will see her in a year.  Otherwise see as needed.  Call if culture positive.  Full templates discussed  1. Mixed incontinence (Primary)  - Urinalysis, Complete   No follow-ups on file.  Martina Sinner, MD  [facility] [address] [place], [place] [address] [phone]

## 2023-09-30 NOTE — Addendum Note (Signed)
 Addended byRanda Lynn on: 09/30/2023 03:08 PM   Modules accepted: Orders

## 2023-10-03 LAB — CULTURE, URINE COMPREHENSIVE

## 2023-11-26 ENCOUNTER — Ambulatory Visit: Payer: Self-pay | Admitting: Family Medicine

## 2023-12-02 ENCOUNTER — Encounter: Payer: Self-pay | Admitting: Family Medicine

## 2023-12-02 ENCOUNTER — Ambulatory Visit (INDEPENDENT_AMBULATORY_CARE_PROVIDER_SITE_OTHER): Admitting: Family Medicine

## 2023-12-02 VITALS — BP 138/82 | Ht 59.0 in | Wt 179.2 lb

## 2023-12-02 DIAGNOSIS — I10 Essential (primary) hypertension: Secondary | ICD-10-CM | POA: Diagnosis not present

## 2023-12-02 MED ORDER — CARVEDILOL 6.25 MG PO TABS: 6.2500 mg | ORAL_TABLET | Freq: Two times a day (BID) | ORAL | 3 refills | Status: DC

## 2023-12-02 NOTE — Progress Notes (Signed)
 Date:  12/02/2023   Name:  Abigail Malone   DOB:  1951/04/24   MRN:  [id number]   Chief Complaint: Medical Management of Chronic Issues  Hypertension This is a chronic problem. The current episode started more than 1 year ago. The problem has been gradually improving since onset. The problem is controlled. Pertinent negatives include no anxiety, blurred vision, chest pain, headaches, malaise/fatigue, neck pain, orthopnea, palpitations, peripheral edema, PND, shortness of breath or sweats. Risk factors for coronary artery disease include dyslipidemia. Past treatments include beta blockers. The current treatment provides mild improvement. There are no compliance problems.  There is no history of CAD/MI or CVA. There is no history of chronic renal disease, a hypertension causing med or renovascular disease.    Lab Results  Component Value Date   NA 145 (H) 12/11/2022   K 4.0 12/11/2022   CO2 26 12/11/2022   GLUCOSE 87 12/11/2022   BUN 7 (L) 12/11/2022   CREATININE 0.57 12/11/2022   CALCIUM 9.4 12/11/2022   EGFR 97 12/11/2022   GFRNONAA >60 10/12/2021   Lab Results  Component Value Date   CHOL 197 12/11/2022   HDL 67 12/11/2022   LDLCALC 115 (H) 12/11/2022   TRIG 82 12/11/2022   No results found for: "TSH" No results found for: "HGBA1C" Lab Results  Component Value Date   WBC 7.6 10/12/2021   HGB 11.3 (L) 10/12/2021   HCT 34.9 (L) 10/12/2021   MCV 80.4 10/12/2021   PLT 257 10/12/2021   Lab Results  Component Value Date   ALT 13 12/11/2022   AST 16 12/11/2022   ALKPHOS 105 12/11/2022   BILITOT 0.6 12/11/2022   No results found for: "25OHVITD2", "25OHVITD3", "VD25OH"   Review of Systems  Constitutional:  Negative for fatigue and malaise/fatigue.  HENT:  Negative for trouble swallowing.   Eyes:  Negative for blurred vision and visual disturbance.  Respiratory:  Negative for cough, chest tightness, shortness of breath and wheezing.   Cardiovascular:  Negative for  chest pain, palpitations, orthopnea, leg swelling and PND.  Musculoskeletal:  Negative for neck pain.  Neurological:  Positive for dizziness. Negative for headaches.    Patient Active Problem List   Diagnosis Date Noted   Rotator cuff impingement syndrome, right 02/25/2023   Atypical ductal hyperplasia of right breast 09/19/2021   Obesity with body mass index (BMI) of 30.0 to 39.9 01/01/2020   Cardiac syncope 05/18/2019   Hyperlipidemia, mixed 05/18/2019   Mixed stress and urge urinary incontinence 04/01/2018   Abnormal uterine bleeding (AUB) 05/15/2016   Right knee pain 07/26/2015   Allergic rhinitis, seasonal 12/09/2014   Menopausal symptoms 08/05/2014   HTN (hypertension) 11/18/2013   Anemia, unspecified 11/18/2013    Allergies  Allergen Reactions   Amlodipine     Leg cramping    Past Surgical History:  Procedure Laterality Date   BREAST BIOPSY Right 09/07/2021   stereo calcs, ribbon clip, path pending.   BREAST BIOPSY WITH RADIO FREQUENCY LOCALIZER Right 10/18/2021   Procedure: BREAST BIOPSY WITH RADIO FREQUENCY LOCALIZER;  Surgeon: Flynn Hylan, MD;  Location: [facility] ORS;  Service: General;  Laterality: Right;   BREAST EXCISIONAL BIOPSY Right 2023   COLONOSCOPY  ~2018    Social History   Tobacco Use   Smoking status: Former    Current packs/day: 0.00    Average packs/day: 0.5 packs/day for 8.0 years (4.0 ttl pk-yrs)    Types: Cigarettes    Start date: 12/22/1998  Quit date: 12/22/2006    Years since quitting: 16.9    Passive exposure: Never   Smokeless tobacco: Never  Vaping Use   Vaping status: Never Used  Substance Use Topics   Alcohol use: Not Currently   Drug use: No     Medication list has been reviewed and updated.  Current Meds  Medication Sig   acetaminophen  (TYLENOL ) 500 MG tablet Take 650 mg by mouth every 6 (six) hours as needed for moderate pain (pain score 4-6).   carvedilol  (COREG ) 3.125 MG tablet TAKE 1 TABLET(3.125 MG) BY MOUTH TWICE  DAILY WITH A MEAL   Cholecalciferol (VITAMIN D) 50 MCG (2000 UT) tablet Take 2,000 Units by mouth daily.   Coenzyme Q10 200 MG capsule Take 200 mg by mouth daily.   conjugated estrogens  (PREMARIN ) vaginal cream Estrogen Cream Instruction Discard applicator Apply pea sized amount to tip of finger to urethra before bed. Wash hands well after application. Use Monday, Wednesday and Friday   ibuprofen  (ADVIL ) 800 MG tablet TAKE 1 TABLET(800 MG) BY MOUTH EVERY 8 HOURS AS NEEDED   montelukast  (SINGULAIR ) 10 MG tablet Take 1 tablet (10 mg total) by mouth at bedtime.   Omega-3 Fatty Acids (FISH OIL) 1200 MG CAPS Take 1,200 mg by mouth daily.   tolterodine  (DETROL  LA) 4 MG 24 hr capsule Take 1 capsule (4 mg total) by mouth daily.       12/02/2023    1:26 PM 06/13/2023    3:58 PM 05/07/2023    8:58 AM 12/11/2022    9:00 AM  GAD 7 : Generalized Anxiety Score  Nervous, Anxious, on Edge 0 0 0 0  Control/stop worrying 0 0 0 0  Worry too much - different things 0 0 0 0  Trouble relaxing 0 0 0 0  Restless 0 0 0 0  Easily annoyed or irritable 0 0 0 0  Afraid - awful might happen 0 0 0 0  Total GAD 7 Score 0 0 0 0  Anxiety Difficulty Not difficult at all Not difficult at all Not difficult at all Not difficult at all       12/02/2023    1:26 PM 06/13/2023    3:58 PM 05/07/2023    8:58 AM  Depression screen PHQ 2/9  Decreased Interest 0 0 0  Down, Depressed, Hopeless 0 0 0  PHQ - 2 Score 0 0 0  Altered sleeping 0 1 0  Tired, decreased energy 0 1 0  Change in appetite 0 0 0  Feeling bad or failure about yourself  0 0 0  Trouble concentrating 0 0 0  Moving slowly or fidgety/restless 0 0 0  Suicidal thoughts 0 0 0  PHQ-9 Score 0 2 0  Difficult doing work/chores Not difficult at all Not difficult at all Not difficult at all    BP Readings from Last 3 Encounters:  12/02/23 138/82  08/29/23 (!) 166/78  06/13/23 124/78    Physical Exam Vitals and nursing note reviewed.  Constitutional:       General: She is not in acute distress.    Appearance: She is not diaphoretic.  HENT:     Head: Normocephalic and atraumatic.     Right Ear: Tympanic membrane and external ear normal.     Left Ear: Tympanic membrane and external ear normal.     Nose: Nose normal.     Mouth/Throat:     Mouth: Mucous membranes are moist.  Eyes:     General:  Right eye: No discharge.        Left eye: No discharge.     Conjunctiva/sclera: Conjunctivae normal.     Pupils: Pupils are equal, round, and reactive to light.  Neck:     Thyroid : No thyromegaly.     Vascular: No JVD.  Cardiovascular:     Rate and Rhythm: Normal rate and regular rhythm.     Heart sounds: Normal heart sounds. No murmur heard.    No friction rub. No gallop.  Pulmonary:     Effort: Pulmonary effort is normal.     Breath sounds: Normal breath sounds. No stridor. No wheezing, rhonchi or rales.  Chest:     Chest wall: No tenderness.  Abdominal:     General: Bowel sounds are normal.     Palpations: Abdomen is soft.     Tenderness: There is no abdominal tenderness.  Musculoskeletal:        General: Normal range of motion.     Cervical back: Normal range of motion and neck supple.  Lymphadenopathy:     Cervical: No cervical adenopathy.  Skin:    General: Skin is warm and dry.  Neurological:     Mental Status: She is alert.     Deep Tendon Reflexes: Reflexes are normal and symmetric.     Wt Readings from Last 3 Encounters:  12/02/23 179 lb 3.2 oz (81.3 kg)  08/29/23 180 lb (81.6 kg)  08/05/23 169 lb (76.7 kg)    BP 138/82 (BP Location: Left Arm, Patient Position: Sitting, Cuff Size: Normal)   Ht 4\' 11"  (1.499 m)   Wt 179 lb 3.2 oz (81.3 kg)   BMI 36.19 kg/m   Assessment and Plan: 1. Primary hypertension (Primary) Chronic.  Controlled.  Stable.  Blood pressure today is 138/82.  Asymptomatic.  Tolerating medication well.  Patient currently is on carvedilol  3.125 but I have a feeling that we need to go up on the  dosing and this is fairly low I have asked patient to double up on her 3.125 such that it is 6.25 twice a day.  Pending toleration of this dosing we will convert to 6.25 mg carvedilol  twice a day.  We will check renal function panel for control of her GFR and electrolytes and will recheck patient in 6 months.  Patient has expressed an interest in having geriatrics involved with her care and we have made a referral for primary care with Dr. Spence Dux - Renal Function Panel     Abigail Allis, MD

## 2023-12-03 ENCOUNTER — Ambulatory Visit: Payer: Self-pay | Admitting: Family Medicine

## 2023-12-03 LAB — RENAL FUNCTION PANEL
Albumin: 4.3 g/dL (ref 3.8–4.8)
BUN/Creatinine Ratio: 10 — ABNORMAL LOW (ref 12–28)
BUN: 6 mg/dL — ABNORMAL LOW (ref 8–27)
CO2: 23 mmol/L (ref 20–29)
Calcium: 9.4 mg/dL (ref 8.7–10.3)
Chloride: 104 mmol/L (ref 96–106)
Creatinine, Ser: 0.63 mg/dL (ref 0.57–1.00)
Glucose: 81 mg/dL (ref 70–99)
Phosphorus: 3.7 mg/dL (ref 3.0–4.3)
Potassium: 4 mmol/L (ref 3.5–5.2)
Sodium: 141 mmol/L (ref 134–144)
eGFR: 94 mL/min/{1.73_m2} (ref >59)

## 2024-02-26 ENCOUNTER — Ambulatory Visit: Payer: PPO

## 2024-03-05 DIAGNOSIS — Z1331 Encounter for screening for depression: Secondary | ICD-10-CM | POA: Diagnosis not present

## 2024-03-05 DIAGNOSIS — I10 Essential (primary) hypertension: Secondary | ICD-10-CM | POA: Diagnosis not present

## 2024-03-05 DIAGNOSIS — N3946 Mixed incontinence: Secondary | ICD-10-CM | POA: Diagnosis not present

## 2024-03-05 DIAGNOSIS — D649 Anemia, unspecified: Secondary | ICD-10-CM | POA: Diagnosis not present

## 2024-03-05 DIAGNOSIS — E782 Mixed hyperlipidemia: Secondary | ICD-10-CM | POA: Diagnosis not present

## 2024-04-10 DIAGNOSIS — I371 Nonrheumatic pulmonary valve insufficiency: Secondary | ICD-10-CM | POA: Diagnosis not present

## 2024-04-10 DIAGNOSIS — K118 Other diseases of salivary glands: Secondary | ICD-10-CM | POA: Diagnosis not present

## 2024-04-10 DIAGNOSIS — R918 Other nonspecific abnormal finding of lung field: Secondary | ICD-10-CM | POA: Diagnosis not present

## 2024-04-10 DIAGNOSIS — I700 Atherosclerosis of aorta: Secondary | ICD-10-CM | POA: Diagnosis not present

## 2024-04-10 DIAGNOSIS — D110 Benign neoplasm of parotid gland: Secondary | ICD-10-CM | POA: Diagnosis not present

## 2024-04-10 DIAGNOSIS — I639 Cerebral infarction, unspecified: Secondary | ICD-10-CM | POA: Diagnosis not present

## 2024-04-10 DIAGNOSIS — I6389 Other cerebral infarction: Secondary | ICD-10-CM | POA: Diagnosis not present

## 2024-04-10 DIAGNOSIS — I2510 Atherosclerotic heart disease of native coronary artery without angina pectoris: Secondary | ICD-10-CM | POA: Diagnosis not present

## 2024-04-10 DIAGNOSIS — E876 Hypokalemia: Secondary | ICD-10-CM | POA: Diagnosis not present

## 2024-04-10 DIAGNOSIS — I10 Essential (primary) hypertension: Secondary | ICD-10-CM | POA: Diagnosis not present

## 2024-04-10 DIAGNOSIS — R4182 Altered mental status, unspecified: Secondary | ICD-10-CM | POA: Diagnosis not present

## 2024-04-10 DIAGNOSIS — I63412 Cerebral infarction due to embolism of left middle cerebral artery: Secondary | ICD-10-CM | POA: Diagnosis not present

## 2024-04-10 DIAGNOSIS — I6302 Cerebral infarction due to thrombosis of basilar artery: Secondary | ICD-10-CM | POA: Diagnosis not present

## 2024-04-10 DIAGNOSIS — I340 Nonrheumatic mitral (valve) insufficiency: Secondary | ICD-10-CM | POA: Diagnosis not present

## 2024-04-10 DIAGNOSIS — G459 Transient cerebral ischemic attack, unspecified: Secondary | ICD-10-CM | POA: Diagnosis not present

## 2024-04-10 DIAGNOSIS — R404 Transient alteration of awareness: Secondary | ICD-10-CM | POA: Diagnosis not present

## 2024-04-10 DIAGNOSIS — D119 Benign neoplasm of major salivary gland, unspecified: Secondary | ICD-10-CM | POA: Diagnosis not present

## 2024-04-11 DIAGNOSIS — G8929 Other chronic pain: Secondary | ICD-10-CM | POA: Diagnosis not present

## 2024-04-11 DIAGNOSIS — E876 Hypokalemia: Secondary | ICD-10-CM | POA: Diagnosis not present

## 2024-04-11 DIAGNOSIS — Z7982 Long term (current) use of aspirin: Secondary | ICD-10-CM | POA: Diagnosis not present

## 2024-04-11 DIAGNOSIS — M25511 Pain in right shoulder: Secondary | ICD-10-CM | POA: Diagnosis not present

## 2024-04-11 DIAGNOSIS — I088 Other rheumatic multiple valve diseases: Secondary | ICD-10-CM | POA: Diagnosis not present

## 2024-04-11 DIAGNOSIS — Z7901 Long term (current) use of anticoagulants: Secondary | ICD-10-CM | POA: Diagnosis not present

## 2024-04-11 DIAGNOSIS — R4781 Slurred speech: Secondary | ICD-10-CM | POA: Diagnosis not present

## 2024-04-11 DIAGNOSIS — R29701 NIHSS score 1: Secondary | ICD-10-CM | POA: Diagnosis not present

## 2024-04-11 DIAGNOSIS — Z79899 Other long term (current) drug therapy: Secondary | ICD-10-CM | POA: Diagnosis not present

## 2024-04-11 DIAGNOSIS — I63412 Cerebral infarction due to embolism of left middle cerebral artery: Secondary | ICD-10-CM | POA: Diagnosis not present

## 2024-04-11 DIAGNOSIS — E785 Hyperlipidemia, unspecified: Secondary | ICD-10-CM | POA: Diagnosis not present

## 2024-04-11 DIAGNOSIS — R112 Nausea with vomiting, unspecified: Secondary | ICD-10-CM | POA: Diagnosis not present

## 2024-04-11 DIAGNOSIS — D72829 Elevated white blood cell count, unspecified: Secondary | ICD-10-CM | POA: Diagnosis not present

## 2024-04-11 DIAGNOSIS — K118 Other diseases of salivary glands: Secondary | ICD-10-CM | POA: Diagnosis not present

## 2024-04-11 DIAGNOSIS — I639 Cerebral infarction, unspecified: Secondary | ICD-10-CM | POA: Diagnosis not present

## 2024-04-11 DIAGNOSIS — Z8673 Personal history of transient ischemic attack (TIA), and cerebral infarction without residual deficits: Secondary | ICD-10-CM | POA: Diagnosis not present

## 2024-04-11 DIAGNOSIS — I10 Essential (primary) hypertension: Secondary | ICD-10-CM | POA: Diagnosis not present

## 2024-04-11 DIAGNOSIS — Z7902 Long term (current) use of antithrombotics/antiplatelets: Secondary | ICD-10-CM | POA: Diagnosis not present

## 2024-04-11 DIAGNOSIS — I6381 Other cerebral infarction due to occlusion or stenosis of small artery: Secondary | ICD-10-CM | POA: Diagnosis not present

## 2024-04-11 DIAGNOSIS — M549 Dorsalgia, unspecified: Secondary | ICD-10-CM | POA: Diagnosis not present

## 2024-04-11 DIAGNOSIS — R4182 Altered mental status, unspecified: Secondary | ICD-10-CM | POA: Diagnosis not present

## 2024-04-11 DIAGNOSIS — J9811 Atelectasis: Secondary | ICD-10-CM | POA: Diagnosis not present

## 2024-04-11 DIAGNOSIS — I471 Supraventricular tachycardia, unspecified: Secondary | ICD-10-CM | POA: Diagnosis not present

## 2024-04-11 DIAGNOSIS — Z87891 Personal history of nicotine dependence: Secondary | ICD-10-CM | POA: Diagnosis not present

## 2024-04-16 DIAGNOSIS — K118 Other diseases of salivary glands: Secondary | ICD-10-CM | POA: Diagnosis not present

## 2024-04-16 DIAGNOSIS — E876 Hypokalemia: Secondary | ICD-10-CM | POA: Diagnosis not present

## 2024-04-16 DIAGNOSIS — R29810 Facial weakness: Secondary | ICD-10-CM | POA: Diagnosis not present

## 2024-04-16 DIAGNOSIS — I69398 Other sequelae of cerebral infarction: Secondary | ICD-10-CM | POA: Diagnosis not present

## 2024-04-16 DIAGNOSIS — B9620 Unspecified Escherichia coli [E. coli] as the cause of diseases classified elsewhere: Secondary | ICD-10-CM | POA: Diagnosis not present

## 2024-04-16 DIAGNOSIS — D72829 Elevated white blood cell count, unspecified: Secondary | ICD-10-CM | POA: Diagnosis not present

## 2024-04-16 DIAGNOSIS — Z7902 Long term (current) use of antithrombotics/antiplatelets: Secondary | ICD-10-CM | POA: Diagnosis not present

## 2024-04-16 DIAGNOSIS — J9811 Atelectasis: Secondary | ICD-10-CM | POA: Diagnosis not present

## 2024-04-16 DIAGNOSIS — N390 Urinary tract infection, site not specified: Secondary | ICD-10-CM | POA: Diagnosis not present

## 2024-04-16 DIAGNOSIS — R112 Nausea with vomiting, unspecified: Secondary | ICD-10-CM | POA: Diagnosis not present

## 2024-04-16 DIAGNOSIS — F05 Delirium due to known physiological condition: Secondary | ICD-10-CM | POA: Diagnosis not present

## 2024-04-16 DIAGNOSIS — R4182 Altered mental status, unspecified: Secondary | ICD-10-CM | POA: Diagnosis not present

## 2024-04-16 DIAGNOSIS — M549 Dorsalgia, unspecified: Secondary | ICD-10-CM | POA: Diagnosis not present

## 2024-04-16 DIAGNOSIS — M25511 Pain in right shoulder: Secondary | ICD-10-CM | POA: Diagnosis not present

## 2024-04-16 DIAGNOSIS — Z1152 Encounter for screening for COVID-19: Secondary | ICD-10-CM | POA: Diagnosis not present

## 2024-04-16 DIAGNOSIS — Z79899 Other long term (current) drug therapy: Secondary | ICD-10-CM | POA: Diagnosis not present

## 2024-04-16 DIAGNOSIS — I10 Essential (primary) hypertension: Secondary | ICD-10-CM | POA: Diagnosis not present

## 2024-04-16 DIAGNOSIS — I6389 Other cerebral infarction: Secondary | ICD-10-CM | POA: Diagnosis not present

## 2024-04-16 DIAGNOSIS — R1310 Dysphagia, unspecified: Secondary | ICD-10-CM | POA: Diagnosis not present

## 2024-04-16 DIAGNOSIS — Z7901 Long term (current) use of anticoagulants: Secondary | ICD-10-CM | POA: Diagnosis not present

## 2024-04-16 DIAGNOSIS — Z7982 Long term (current) use of aspirin: Secondary | ICD-10-CM | POA: Diagnosis not present

## 2024-04-16 DIAGNOSIS — I63412 Cerebral infarction due to embolism of left middle cerebral artery: Secondary | ICD-10-CM | POA: Diagnosis not present

## 2024-04-16 DIAGNOSIS — I69391 Dysphagia following cerebral infarction: Secondary | ICD-10-CM | POA: Diagnosis not present

## 2024-04-16 DIAGNOSIS — M5489 Other dorsalgia: Secondary | ICD-10-CM | POA: Diagnosis not present

## 2024-04-16 DIAGNOSIS — R7881 Bacteremia: Secondary | ICD-10-CM | POA: Diagnosis not present

## 2024-04-16 DIAGNOSIS — I639 Cerebral infarction, unspecified: Secondary | ICD-10-CM | POA: Diagnosis not present

## 2024-04-16 DIAGNOSIS — I471 Supraventricular tachycardia, unspecified: Secondary | ICD-10-CM | POA: Diagnosis not present

## 2024-04-29 NOTE — Discharge Summary (Signed)
 ------------------------------------------------------------------------------- Attestation signed by Abigail Mt, MD at 05/06/24 1642 I saw and evaluated the patient, participating in the key portions of the service on the day of discharge.  I reviewed the resident's note and agree with the discharge plans and disposition. I personally spent 31 minutes in discharge planning services. Malone Resa, MD   -------------------------------------------------------------------------------  [facility] Physical Medicine and Rehab Discharge Summary  Patient Name: Abigail Malone      Medical Record Number: [id number]  Date of Birth: 1951/05/23 Sex: Female         Room/Bed: 3H215/3H215-01 Payor Info: Payor: [employer] / Plan: [employer] / Product Type: *No Product type* /    Admit Date: 04/16/2024 Discharge Date: 05/06/2024 Admitting Physician: Abigail Hadassah Mt, MD Discharge Physician: Abigail Malone, M.D. Rehab Impairment Group Code [facility]):   (Stroke) 01.2 Right Body Involvement (Left Brain) ; Etiology: ischemic stroke   To do: [ ]  PCP for BP management  [ ]  ENT for parotid lesion follow up [ ]  Neuro for stroke management   Admission Functional Status: Discharge Functional Status:  Activities of Daily Living: Feeding : Supervision; Verbal assist/cues required   Grooming: Min assist; Verbal assist/cues required   Bathing: Mod assist; Verbal assist/cues required   Toileting: Min assist; Verbal assist/cues required   UB Dressing: Min assist; Verbal assist/cues required   LB Dressing: Mod assist; Verbal assist/cues required   IADLs: NT     Mobility:  Bed Mobility comments: Pt requiring mod A x1 for supine to sit with slow intitiation requiring heavy simple one step commands for each task, reaching across for bed rail and HOB elevated. Max A to safely return to supine d/t decreased arousal/responsiveness.   Skilled Treatment Performed: Sit/stand  from EOB with RW and mod A x1 with heavy cueing for hand placement and nose over toes. Pt able to stand for approx > 5 minutes practicing side steps and marches.   Stairs: NT   Wheelchair Mobility: NT     Cognition/Swallow/Speech:   DME Recommendations: PT DME Recommendations: Defer to post acute   OT DME Recommendations: Defer to post acute     Willingness to Participate: Patient able and willing to participate in 3 hours of therapy     Requires modified schedule: Pt may require rest breaks between therapy sessions     Assistive Devices:   PHYSICAL THERAPY  Discharge Summary: DC was  extended until 10/15 to finish her IV antibiotic course per Dr. erick.  OCCUPATIONAL THERAPY Discharge Summary: Abigail Malone is a 73 y.o. female with past medical history of HTN who was admitted to [facility] on 04/10/2024 for AMS, found to have small acute infarct in left basal ganglia and left corona radiata on MRI.  She was admitted to [facility] for comprehensive interdisciplinary rehabilitation. Patient has made good progress towards her goals; she is able to complete basic ADLs and functional mobility using a rollator at a modI to totalA level. Patient continues to be limited by decreased dynamic standing balance, decreased functional activity tolerance, decreased functional cognition, decreased motor planning, and decreased RUE gross/fine motor coordination. Plan to DC home with family for assistance; recommending HHOT.  SPEECH LANGUAGE PATHOLOGY Pt seen up in recliner chair this PM for skilled ST intervention targeting cognitive communication deficits. Pt's dtr-in-law reports pt feeling better after BM. Pt demonstrating lethargy but arousable to strong verbal and tactile cues. Vitals WNL (BP 122/60, MAP 77, SPO2 96). Pt required significantly extended time and maximal cues  for response this date in order to complete written family tree by writing names into printed structure. Pt noted to reverse her  sons' children, but receptive to heavy cues for self-correction. ST to follow per POC.  Assistive devices:  Hospital Bed Necessity: Given the patient's diagnosis of left basal ganglia and corona radiata infarct, she will greatly benefit from a hospital bed. The beneficiary has a medical condition which requires positioning of the body in ways not feasible with an ordinary bed. The hospital bed will allow the patient to have frequent changes in body position to prevent skin breakdown/wound formation, prevent and help alleviate contractures, and to allow easier transfers from bed to wheelchair. The patient also requires the head of bed to be elevated in order for him/her to stay in a healthy state by being able to handle his/her secretions and prevent aspiration. Patient requires traction equipment, which can only be attached to a hospital bed.     Indication for Admission / HPI:   Abigail Malone is a 73 y.o. female with a pertinent past medical history of HTN who was admitted to [facility] on 04/10/2024 for AMS, found to have small acute infarct in left basal ganglia and left corona radiata on MRI. LKN around 10 am 9/19; patient presented outside window for thrombolytics. DX: Ischemic stroke in the L MCA territory Abigail Malone has participated in acute inpatient physical and occupational therapies to improve functional mobility, activity tolerance, functional strength, balance, and endurance in order to facilitate safe performance of ADLs and daily routines. IF SPEECH: HE/SHE participated in acute inpatient speech-language therapy for executive functioning, expressive speech, receptive speech, and dysphagia management. Abigail Malone has been referred to [facility] for continued acute medical management, provision of intensive inpatient therapies, and patient/family training to facilitate safe performance of ADLs and mobility, including functional cognition and self-care (ONLY IF SPEECH), prior to discharge home.    Hospital Course:   REHAB:  Patient participated with PT and OT to maximize functional status with mobility and ADLs as well as prevention of joint contracture. SLP assisted with cognitive and swallow function. RT assisted with community re-integration, education, and leisure support services. Patient was discussed in weekly Interdisciplinary Team Conference. She will discharge with [facility] PT/OT/SLP/Nursing.   Ischemic stroke in the L MCA territory  AMS Presented due to AMS, LKN 10am 9/19, CT with right basal ganglia and left occipital infarcts of indeterminate age, no TNK due to out of window.  Patient will continue ASA 81 mg daily + Plavix 75 mg daily x 20 doses (end date 10/21) and Atorvastatin 80 mg daily. Patient having difficulty swallowing pills and will have episodes of vomiting when trying to swallow pills make it difficult to take scheduled medications but improved on discharge.Patient will follow up with Neurology and PM&R upon discharge.    E. Coli bacteremia likely 2/2 to urinary source (Resolved) Patient with intermittent fevers over the last couple of days. Symptoms are non-specific but had included cough, nausea, and vomiting. Urine and blood cultures positive for E. Coli. Patient started on IV CTX on 10/9. Patient completed 7 day course of IV ceftriaxone.    R shoulder pain  Back pain:  Overall well controlled with voltaren and lidocaine  patches    HTN SBP from 220s-240s intermittently during hospital admission.Had episode of decreased responsiveness with BP 245/95 on 9/23 PM. HTN has been improving since then and more controlled but increased as patient had issues taking pills due to taste/texture. Patient will continue  coreg  12.5 mg BID, valsartan 160 mg BID, Nifedipine 30 mg daily and clonidine patch 0.1mg /24hr for blood pressure control.    Waxing/waning mental status Throughout patient's hospital course, patient exhibited waxing/waning mental status including those described  as decreased responsiveness.  Episodes occurred both in conjunction with periods of elevated blood pressure as above, though also occurring independently of any identifiable BP changes.  EEG was obtained and was noncontributory. Etiology somewhat unclear. Electrodiagnostic data not supportive of seizure component. Possible contributor by presence of encephalopathy, difficult to clearly attribute as primary etiology. Patient with significant event overnight (10/13) as there was concerns of drowsiness and difficulty arousing her. There was also concern for new right sided facial droop. Blood pressures were elevated overnight in the 200s systolic requiring prn labetalol as patient refused oral hydral. CTH ordered without acute abnormalities. Patient initiated on Amantadine 100 mg BID (8am/12pm) while at [facility] with improvements in wakefulness.    Left parotid lesion  Enhancing lesion in the left parotid gland (2.4 x 1.7 cm), at least partially involving the deep lobe, seen on 9/20 MRI. ENT completed parotid gland FNA 9/23 with plans for OP follow up.  FNA path reviewed with ENT and family.  Outpatient follow up in ENT clinic   Supraventricular tachycardia Noted overnight 9/25, medicine was following at [facility]. No other episodes of SVT.   Consults: None  Procedures: None  Discharge Medications:    Your Medication List     PAUSE taking these medications    tolterodine  4 MG 24 hr capsule Wait to take this until your doctor or other care provider tells you to start again. Commonly known as: DETROL  LA Take 1 capsule (4 mg total) by mouth daily.       STOP taking these medications    carvedilol  12.5 MG tablet Commonly known as: COREG    clopidogrel 75 mg tablet Commonly known as: PLAVIX       START taking these medications    amantadine HCL 50 mg/5 mL solution Commonly known as: SYMMETREL Take 10 mL (100 mg total) by mouth two (2) times a day.   carvedilol  (COREG ) suspension 1.67  mg/mL Take 7.6 mL (12.6675 mg total) by mouth in the morning and 7.6 mL (12.6675 mg total) in the evening. Take with meals.   cloNIDine 0.1 mg/24 hr Commonly known as: CATAPRES-TTS Place 1 patch on the skin once a week. Start taking on: May 12, 2024       CONTINUE taking these medications    aspirin 81 MG chewable tablet Chew 1 tablet (81 mg total) by mouth daily.   atorvastatin 80 MG tablet Commonly known as: LIPITOR Take 1 tablet (80 mg total) by mouth daily.   NIFEdipine 30 MG 24 hr tablet Commonly known as: PROCARDIA XL Take 1 tablet (30 mg total) by mouth daily.   valsartan 160 MG tablet Commonly known as: DIOVAN Take 1 tablet (160 mg total) by mouth every twelve (12) hours.       Significant Diagnostic Studies: Reviewed in Epic   Discharge Instructions:   Medications: Please take all medications as prescribed below and note any changes.  Other Instructions and Information:  Follow Up instructions and Outpatient Referrals    Ambulatory Referral to Home Health     Reason for referral: Left basal ganglai and corona radiata stroke   Physician to follow patient's care: PCP   Disciplines requested:  Nursing Physical Therapy Occupational Therapy Speech Language Pathology     Nursing requested: Other: (please  enter in comments) Comment -  med/disease management   Physical Therapy requested: Evaluate and treat   Occupational Therapy Requested: Evaluate and treat   Speech Language Pathology requested: Cognitive training   Requested SOC Date:  Comment - within 48 hours of dc   Discharge instructions        Follow-Up Appointments:  Please make all follow-up appointments as noted below. If not already scheduled, please set-up an appointment with a Primary Care Provider for continued general medical care. Other Instructions     Discharge instructions     You were hospitalized at [facility] after having a stroke. A stroke occurs when  the symptoms are persistent. A TIA (transient ischemic attack) occurs when symptoms last for only minutes. This happens when one or more blood vessels in the brain have inadequate blood flow. The lack of blood flow affects brain tissue causing loss of body functions controlled by that area to include numbness, weakness, or difficulty speaking. You have made excellent progress during your rehab stay.   MEDICATIONS: - You are at higher risk for having a stroke compared to the regular population.  - Please take your medications listed below as directed.  - Take aspirin 81 mg every day as instructed unless directed otherwise (for example, temporarily before surgery). - Take atorvastatin 80 mg daily for your cholesterol to help reduce your risk for stroke. - Take Coreg  12.5 mg twice a day, Valsartan 160 mg daily, Nifedipine 30 mg daily and clonidine patch 0.1 mg change once a week to control your blood pressure. - Take amantadine 100 mg twice a day (8am and 12pm) to help with wakefulness and alertness. Can consider stopping medication if you feel like she is alert enough throughout the day and does not need the medication.  - Call your primary healthcare provider if you think your medicine is not working as expected or if you have any medication side effects.  - Take a list of your medicines or the pill bottles to follow-up visits. Carry your medicine list with you in case of an emergency. Please refer to the care notes given to you by Nursing.   DIET: Regular   ACTIVITY: Recovery takes several months, especially if you are elderly or have another illness. Your body uses a lot of energy recovering and needs time and rest. Gradually increase your activity taking rest periods as needed to ensure that you are being safe.   Please continue to perform daily tasks and mobility as instructed by your doctors and therapists. Please make note of any instructions as listed below: - Continue to have supervision  for moving about the house, walking, and doing stairs. - You should have supervision with medications and finances.  ADDITIONAL INSTRUCTIONS:  RISK FACTOR REDUCTION: To reduce your risk of a stroke:  - Treat high blood pressure and take medication as prescribed.  - Do not smoke and avoid secondhand smoke.  - Eat a heart healthy diet that is low in salt and cholesterol.  - Become physically active and keep your weight under control.  - Control your blood sugar and take you medications as instructed if you have diabetes.  - If prescribed, take your cholesterol medication.  - Do not drink excessive alcohol and no more than 2-3 drinks/day as a man or 1-2 drinks/day as a woman.   WHEN TO CALL YOUR PHYSICIAN: Following discharge from the hospital, please call 911 immediately and go to the nearest Emergency Department if you notice: -  Sudden severe headache with no known cause  - Sudden weakness or numbness of face, arm, or leg (paticularly if on one side of the body) - Sudden trouble walking, dizziness, or loss of balance or coordination - Dizziness or loss of vision, particularly if only in one eye  - Difficulty speaking or slurring of your words  You should also seek emergent medical care if you experience any chest pain, shortness of breath, uncontrolled nausea or vomiting, incontinence of urine or stool, severe pain, fevers greater than 101.13F or chills, or other concerning symptoms.   If you develop these symptoms or if you have trouble obtaining any of your medications, you may also call the [facility] at [phone] as needed.  You may go to the [facility] at [facility] in [place] or call the [facility] at [phone] for further assistance.  FOLLOW-UP: Please see the list below of follow-up appointments and make note of the additional providers you will need to see:  - You have an appointment with PM&R (Physical  Medicine & Rehab) with Dr. Norleen Punch at 8:40am on 06/16/2024. Our office is located at [address] [place], [place] [address] at [facility]. If you have questions about your appointment, please give our office a call at [phone]   -You will have follow up with [facility] for stroke management. Their office is located at [address] [place], [place]  [address]. Please call their office at [phone] to set up an appointment.  -You will have a follow up with [facility] for your parotid lesion. Their office is located at [address] [facility] [address], [place], [place] [address]. To schedule an appointment please give their office a call at [phone]  - Follow-up with your Primary Care Physician in 1-2 weeks to inform him or her of your recent hospital admission and any medication changes. If not already scheduled, please contact to set-up an appointment.  STROKE RESOURCES: Please call with any post discharge medical questions regarding your stroke or TIA:  Devere CHARLENA Blush RN, MSN, C-ANP  Adult Stroke Nurse Practitioner  [facility]  [phone]   - Support Groups include:  [facility] Stroke Support Group  Meets the 2nd Wednesday of the month from 1-2pm at the [facility].  Contact Deneice Cleveland at [phone] for more information   [facility] (sponsored by [employer])  Contact Joy Beverley at [phone] for more information  American Stroke Association  1-888-4-STROKE www.strokeassociation.com      Appointments which have been scheduled for you    Jun 16, 2024 8:40 AM (Arrive by 8:25 AM) RETURN GENERAL with Norleen Ozell Punch, MD [facility] PHYSICAL MEDICINE [facility] [address] [place] [facility]) [address] [place] [address] [phone]        Discharge Day Services: The patient was seen and examined on the day of discharge. Vitals  signs and exam are stable. Therapy goals met. Discharge medications and instructions were discussed with the patient and family, and all questions were answered.  Time spent for discharge: 30 minutes or greater  Physical Exam: Vitals:   05/06/24 1200  BP: 167/68  Pulse:   Resp:   Temp:   SpO2:           GEN: Alert, resting comfortably in recliner HEENT: Atraumatic. Normocephalic. Moist mucous membranes. Trachea midline. RESP:  NWOB on RA. CV: Regular rate and rhythm. GI: abd soft, NTND GU: deferred PSYCH: Flat affect  SKIN: No erythema, redness, or skin break down  MSK: No pain, redness or swelling on the joints NEURO: Mental Status: AAOX2 (could not correctly answer year/month)             Motor:  - RUE 4+/5 shoulder abd, 5/5 EF, 5/5 EE, 4/5 WE, 4/5 HG - LUE  4+/5 shoulder abd, 5/5 EF, 5/5 EE, 5/5 WE, 4/5 HG - RLE 5/5 HF, 5/5 KE, 5/5 DF, 5/5 PF - LLE  5/5 HF, 5/5 KE, 5/5 DF, 5/5 PF  Discharge Condition: Improved  Discharge Disposition: home with family assistance/supervision  Home Health: Nursing, Physical Therapy, Occupational Therapy, and Speech Therapy  Outpatient Therapy: None   Dictation software was used while making this note. Please excuse any errors made with dictation software and interpret errors as such.   Ole Feeling, MD PGY-3 Physical Medicine & Rehabilitation

## 2024-05-11 DIAGNOSIS — G9341 Metabolic encephalopathy: Secondary | ICD-10-CM | POA: Diagnosis not present

## 2024-05-11 DIAGNOSIS — I700 Atherosclerosis of aorta: Secondary | ICD-10-CM | POA: Diagnosis not present

## 2024-05-11 DIAGNOSIS — Z556 Problems related to health literacy: Secondary | ICD-10-CM | POA: Diagnosis not present

## 2024-05-11 DIAGNOSIS — I088 Other rheumatic multiple valve diseases: Secondary | ICD-10-CM | POA: Diagnosis not present

## 2024-05-11 DIAGNOSIS — I69391 Dysphagia following cerebral infarction: Secondary | ICD-10-CM | POA: Diagnosis not present

## 2024-05-11 DIAGNOSIS — Z9181 History of falling: Secondary | ICD-10-CM | POA: Diagnosis not present

## 2024-05-11 DIAGNOSIS — G8929 Other chronic pain: Secondary | ICD-10-CM | POA: Diagnosis not present

## 2024-05-11 DIAGNOSIS — M19011 Primary osteoarthritis, right shoulder: Secondary | ICD-10-CM | POA: Diagnosis not present

## 2024-05-11 DIAGNOSIS — I471 Supraventricular tachycardia, unspecified: Secondary | ICD-10-CM | POA: Diagnosis not present

## 2024-05-11 DIAGNOSIS — M47814 Spondylosis without myelopathy or radiculopathy, thoracic region: Secondary | ICD-10-CM | POA: Diagnosis not present

## 2024-05-11 DIAGNOSIS — M19012 Primary osteoarthritis, left shoulder: Secondary | ICD-10-CM | POA: Diagnosis not present

## 2024-05-11 DIAGNOSIS — Z7982 Long term (current) use of aspirin: Secondary | ICD-10-CM | POA: Diagnosis not present

## 2024-05-11 DIAGNOSIS — K449 Diaphragmatic hernia without obstruction or gangrene: Secondary | ICD-10-CM | POA: Diagnosis not present

## 2024-05-11 DIAGNOSIS — K118 Other diseases of salivary glands: Secondary | ICD-10-CM | POA: Diagnosis not present

## 2024-05-11 DIAGNOSIS — I2510 Atherosclerotic heart disease of native coronary artery without angina pectoris: Secondary | ICD-10-CM | POA: Diagnosis not present

## 2024-05-11 DIAGNOSIS — I10 Essential (primary) hypertension: Secondary | ICD-10-CM | POA: Diagnosis not present

## 2024-05-11 DIAGNOSIS — E876 Hypokalemia: Secondary | ICD-10-CM | POA: Diagnosis not present

## 2024-05-21 DIAGNOSIS — I6381 Other cerebral infarction due to occlusion or stenosis of small artery: Secondary | ICD-10-CM | POA: Diagnosis not present

## 2024-05-21 DIAGNOSIS — I10 Essential (primary) hypertension: Secondary | ICD-10-CM | POA: Diagnosis not present

## 2024-05-21 DIAGNOSIS — Z09 Encounter for follow-up examination after completed treatment for conditions other than malignant neoplasm: Secondary | ICD-10-CM | POA: Diagnosis not present

## 2024-05-21 DIAGNOSIS — Z1331 Encounter for screening for depression: Secondary | ICD-10-CM | POA: Diagnosis not present

## 2024-06-03 DIAGNOSIS — Z8673 Personal history of transient ischemic attack (TIA), and cerebral infarction without residual deficits: Secondary | ICD-10-CM | POA: Diagnosis not present

## 2024-06-03 DIAGNOSIS — K118 Other diseases of salivary glands: Secondary | ICD-10-CM | POA: Diagnosis not present

## 2024-06-06 DIAGNOSIS — Z79899 Other long term (current) drug therapy: Secondary | ICD-10-CM | POA: Diagnosis not present

## 2024-06-06 DIAGNOSIS — Z7982 Long term (current) use of aspirin: Secondary | ICD-10-CM | POA: Diagnosis not present

## 2024-06-06 DIAGNOSIS — Z7902 Long term (current) use of antithrombotics/antiplatelets: Secondary | ICD-10-CM | POA: Diagnosis not present

## 2024-06-06 DIAGNOSIS — R079 Chest pain, unspecified: Secondary | ICD-10-CM | POA: Diagnosis not present

## 2024-06-06 DIAGNOSIS — I10 Essential (primary) hypertension: Secondary | ICD-10-CM | POA: Diagnosis not present

## 2024-06-06 DIAGNOSIS — M25511 Pain in right shoulder: Secondary | ICD-10-CM | POA: Diagnosis not present

## 2024-06-06 DIAGNOSIS — Z8673 Personal history of transient ischemic attack (TIA), and cerebral infarction without residual deficits: Secondary | ICD-10-CM | POA: Diagnosis not present

## 2024-06-12 DIAGNOSIS — Z23 Encounter for immunization: Secondary | ICD-10-CM | POA: Diagnosis not present

## 2024-06-12 DIAGNOSIS — Z1331 Encounter for screening for depression: Secondary | ICD-10-CM | POA: Diagnosis not present

## 2024-06-12 DIAGNOSIS — H353131 Nonexudative age-related macular degeneration, bilateral, early dry stage: Secondary | ICD-10-CM | POA: Diagnosis not present

## 2024-06-12 DIAGNOSIS — Z8673 Personal history of transient ischemic attack (TIA), and cerebral infarction without residual deficits: Secondary | ICD-10-CM | POA: Diagnosis not present

## 2024-06-12 DIAGNOSIS — Z09 Encounter for follow-up examination after completed treatment for conditions other than malignant neoplasm: Secondary | ICD-10-CM | POA: Diagnosis not present

## 2024-06-12 DIAGNOSIS — H2513 Age-related nuclear cataract, bilateral: Secondary | ICD-10-CM | POA: Diagnosis not present

## 2024-06-15 DIAGNOSIS — Z1152 Encounter for screening for COVID-19: Secondary | ICD-10-CM | POA: Diagnosis not present

## 2024-06-15 DIAGNOSIS — R29703 NIHSS score 3: Secondary | ICD-10-CM | POA: Diagnosis not present

## 2024-06-15 DIAGNOSIS — R29810 Facial weakness: Secondary | ICD-10-CM | POA: Diagnosis not present

## 2024-06-15 DIAGNOSIS — E876 Hypokalemia: Secondary | ICD-10-CM | POA: Diagnosis not present

## 2024-06-15 DIAGNOSIS — R531 Weakness: Secondary | ICD-10-CM | POA: Diagnosis not present

## 2024-06-15 DIAGNOSIS — N179 Acute kidney failure, unspecified: Secondary | ICD-10-CM | POA: Diagnosis not present

## 2024-06-15 DIAGNOSIS — R4182 Altered mental status, unspecified: Secondary | ICD-10-CM | POA: Diagnosis not present

## 2024-06-15 DIAGNOSIS — Z8673 Personal history of transient ischemic attack (TIA), and cerebral infarction without residual deficits: Secondary | ICD-10-CM | POA: Diagnosis not present

## 2024-06-15 DIAGNOSIS — Z87891 Personal history of nicotine dependence: Secondary | ICD-10-CM | POA: Diagnosis not present

## 2024-06-15 DIAGNOSIS — Z7982 Long term (current) use of aspirin: Secondary | ICD-10-CM | POA: Diagnosis not present

## 2024-06-15 DIAGNOSIS — I10 Essential (primary) hypertension: Secondary | ICD-10-CM | POA: Diagnosis not present

## 2024-06-15 DIAGNOSIS — Z79899 Other long term (current) drug therapy: Secondary | ICD-10-CM | POA: Diagnosis not present

## 2024-06-15 DIAGNOSIS — R918 Other nonspecific abnormal finding of lung field: Secondary | ICD-10-CM | POA: Diagnosis not present

## 2024-06-16 DIAGNOSIS — R4701 Aphasia: Secondary | ICD-10-CM | POA: Diagnosis not present

## 2024-06-16 DIAGNOSIS — Z7409 Other reduced mobility: Secondary | ICD-10-CM | POA: Diagnosis not present

## 2024-06-16 DIAGNOSIS — Z789 Other specified health status: Secondary | ICD-10-CM | POA: Diagnosis not present

## 2024-06-29 ENCOUNTER — Ambulatory Visit: Admitting: Physician Assistant

## 2024-06-29 ENCOUNTER — Encounter: Payer: Self-pay | Admitting: Physician Assistant

## 2024-06-29 ENCOUNTER — Telehealth: Payer: Self-pay

## 2024-06-29 VITALS — BP 126/56 | HR 69 | Temp 98.8°F | Ht 59.0 in | Wt 169.0 lb

## 2024-06-29 DIAGNOSIS — E782 Mixed hyperlipidemia: Secondary | ICD-10-CM

## 2024-06-29 DIAGNOSIS — R600 Localized edema: Secondary | ICD-10-CM | POA: Diagnosis not present

## 2024-06-29 DIAGNOSIS — I69351 Hemiplegia and hemiparesis following cerebral infarction affecting right dominant side: Secondary | ICD-10-CM | POA: Diagnosis not present

## 2024-06-29 DIAGNOSIS — I10 Essential (primary) hypertension: Secondary | ICD-10-CM

## 2024-06-29 DIAGNOSIS — R7989 Other specified abnormal findings of blood chemistry: Secondary | ICD-10-CM | POA: Diagnosis not present

## 2024-06-29 DIAGNOSIS — E876 Hypokalemia: Secondary | ICD-10-CM | POA: Diagnosis not present

## 2024-06-29 MED ORDER — CLONIDINE 0.1 MG/24HR TD PTWK: 0.1000 mg | MEDICATED_PATCH | TRANSDERMAL | 2 refills | Status: DC

## 2024-06-29 MED ORDER — VALSARTAN 160 MG PO TABS: 160.0000 mg | ORAL_TABLET | Freq: Every day | ORAL | Status: AC

## 2024-06-29 NOTE — Telephone Encounter (Signed)
 Copied from CRM #[id number]. Topic: Appointments - Transfer of Care >> Jun 29, 2024 10:54 AM Frederich PARAS wrote: Pt is requesting to transfer FROM: [facility]  Pt is requesting to transfer TO: [facility] [employer] Reason for requested transfer: Not satisfied at other location  It is the responsibility of the team the patient would like to transfer to (Dr.  Toribio SHAUNNA Hoyle,) to reach out to the patient if for any reason this transfer is not acceptable.

## 2024-06-29 NOTE — Progress Notes (Signed)
 Date:  06/29/2024   Name:  Abigail Malone   DOB:  May 30, 1951   MRN:  [id number]   Chief Complaint: Transitions Of Care (Wants potassium check due to HCTZ) and Joint Swelling (Ankles both ankles, X2 months)  HPI  Abigail Malone is a 73 y.o. female with history of left MCA ischemic stroke Sep 2025 with sequelae including mild right hemiparesis, memory loss, cognitive-communication impairment, and reduced functional independence with mobility and ADLs. Per [facility] Phys Med note 06/16/2024, patient has been referred to PT, OT, cognitive rehab, speech therapy.  Most recent visit with primary care was 06/12/2024 by [facility] (Dr. Salli) where HCTZ dose was increased to 25 mg daily, nifedipine was increased to 60 mg daily, and carvedilol  was also increased to 25 mg twice daily.  3 days later she was seen in the ED for altered mental status which was felt to be possibly related to recent medication changes, as the workup was negative for acute findings.  She is joined by her daughter-in-law Abigail Malone) and husband Abigail Malone) today. They say that she was cognitively intact and quite active prior to her stroke in Sept. Since then, she is very quiet, minimally interactive, sometimes seems to be absent and does not always respond.   Incidental parotid lesion identified on one of her MRIs. 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 Outpatient f/u with ENT 06/03/24 suggested surgical intervention; while the mass is benign, it has potential to undergo malignant transformation. However, due to recent stroke, surgery will be deferred at least 6 months until stable.   Carries a diagnosis in the chart of abnormal uterine bleeding from 2017, patient had workup for this including uterine ultrasound and endometrial biopsy which were normal.  GYN favored a polyp that had degenerated.  Most recent GYN visit appears to have been 07/01/2018. Patient and family believe bleeding has  resolved.   Medication list has been reviewed and updated.  Current Meds  Medication Sig   aspirin 81 MG chewable tablet Chew by mouth daily.   atorvastatin (LIPITOR) 80 MG tablet Take 80 mg by mouth.   carvedilol  (COREG ) 12.5 MG tablet Take 12.5 mg by mouth 2 (two) times daily.   diclofenac Sodium (VOLTAREN) 1 % GEL Apply topically as needed.   hydrochlorothiazide  (HYDRODIURIL ) 12.5 MG tablet Take 12.5 mg by mouth daily.   [DISCONTINUED] cloNIDine  (CATAPRES  - DOSED IN MG/24 HR) 0.1 mg/24hr patch Place 1 patch onto the skin.   [DISCONTINUED] meloxicam  (MOBIC ) 7.5 MG tablet Take 1 tablet (7.5 mg total) by mouth in the morning and at bedtime. (Patient taking differently: Take 7.5 mg by mouth as needed for pain.)   [DISCONTINUED] valsartan  (DIOVAN ) 160 MG tablet Take 160 mg by mouth. (Patient taking differently: Take 160 mg by mouth 2 (two) times daily.)     Review of Systems  Patient Active Problem List   Diagnosis Date Noted   Hemiparesis affecting right side as late effect of cerebrovascular accident (CVA) (HCC) 06/29/2024   Elevated LFTs 06/29/2024   Rotator cuff impingement syndrome, right 02/25/2023   Obesity with body mass index (BMI) of 30.0 to 39.9 01/01/2020   Hyperlipidemia, mixed 05/18/2019   Mixed stress and urge urinary incontinence 04/01/2018   Right knee pain 07/26/2015   Allergic rhinitis, seasonal 12/09/2014   HTN (hypertension) 11/18/2013   Anemia, unspecified 11/18/2013    Allergies  Allergen Reactions   Amlodipine     Leg cramping    Immunization History  Administered  Date(s) Administered   Fluad Quad(high Dose 65+) 04/03/2021, 04/03/2023   Hepatitis B 07/09/2000, 08/15/2000   Influenza-Unspecified 03/21/2022   Moderna Sars-Covid-2 Vaccination 09/04/2019, 10/05/2019, 06/17/2020, 03/21/2022   PNEUMOCOCCAL CONJUGATE-20 06/12/2022   Pneumococcal Conjugate-13 03/07/2017   Pneumococcal Polysaccharide-23 06/07/2015    Past Surgical History:  Procedure  Laterality Date   BREAST BIOPSY Right 09/07/2021   stereo calcs, ribbon clip, path pending.   BREAST BIOPSY WITH RADIO FREQUENCY LOCALIZER Right 10/18/2021   Procedure: BREAST BIOPSY WITH RADIO FREQUENCY LOCALIZER;  Surgeon: Lane Shope, MD;  Location: [facility] ORS;  Service: General;  Laterality: Right;   BREAST EXCISIONAL BIOPSY Right 2023   COLONOSCOPY  ~2018    Social History   Tobacco Use   Smoking status: Former    Current packs/day: 0.00    Average packs/day: 0.5 packs/day for 8.0 years (4.0 ttl pk-yrs)    Types: Cigarettes    Start date: 12/22/1998    Quit date: 12/22/2006    Years since quitting: 17.5    Passive exposure: Never   Smokeless tobacco: Never  Vaping Use   Vaping status: Never Used  Substance Use Topics   Alcohol use: Not Currently   Drug use: No    Family History  Problem Relation Age of Onset   Cancer Mother        Liver   Stroke Mother    Hypertension Mother    Hematuria Mother    Liver cancer Mother    Cancer Father    Prostate cancer Father    Kidney failure Brother    Breast cancer Neg Hx         06/29/2024    2:46 PM 12/02/2023    1:26 PM 06/13/2023    3:58 PM 05/07/2023    8:58 AM  GAD 7 : Generalized Anxiety Score  Nervous, Anxious, on Edge 0 0 0 0  Control/stop worrying 0 0 0 0  Worry too much - different things 0 0 0 0  Trouble relaxing 0 0 0 0  Restless 0 0 0 0  Easily annoyed or irritable 0 0 0 0  Afraid - awful might happen 0 0 0 0  Total GAD 7 Score 0 0 0 0  Anxiety Difficulty Not difficult at all Not difficult at all Not difficult at all Not difficult at all       06/29/2024    2:46 PM 12/02/2023    1:26 PM 06/13/2023    3:58 PM  Depression screen PHQ 2/9  Decreased Interest 0 0 0  Down, Depressed, Hopeless 0 0 0  PHQ - 2 Score 0 0 0  Altered sleeping  0 1  Tired, decreased energy  0 1  Change in appetite  0 0  Feeling bad or failure about yourself   0 0  Trouble concentrating  0 0  Moving slowly or  fidgety/restless  0 0  Suicidal thoughts  0 0  PHQ-9 Score  0  2   Difficult doing work/chores  Not difficult at all Not difficult at all     Data saved with a previous flowsheet row definition    BP Readings from Last 3 Encounters:  06/29/24 (!) 126/56  12/02/23 138/82  08/29/23 (!) 166/78    Wt Readings from Last 3 Encounters:  06/29/24 169 lb (76.7 kg)  12/02/23 179 lb 3.2 oz (81.3 kg)  08/29/23 180 lb (81.6 kg)    BP (!) 126/56 (Cuff Size: Large)   Pulse 69   Temp  98.8 F (37.1 C)   Ht 4' 11 (1.499 m)   Wt 169 lb (76.7 kg)   SpO2 97%   BMI 34.13 kg/m   Physical Exam Vitals and nursing note reviewed.  Constitutional:      Appearance: Normal appearance.  Cardiovascular:     Rate and Rhythm: Normal rate and regular rhythm.     Heart sounds: No murmur heard.    No friction rub. No gallop.  Pulmonary:     Effort: Pulmonary effort is normal.     Breath sounds: Normal breath sounds.  Abdominal:     General: There is no distension.  Musculoskeletal:        General: Normal range of motion.     Right lower leg: 1+ Pitting Edema present.     Left lower leg: 1+ Pitting Edema present.  Skin:    General: Skin is warm and dry.  Neurological:     Mental Status: Mental status is at baseline.     Gait: Gait is intact.     Comments: Patient provides minimal verbal responses, 1-2 words at a time, usually a simple yes or no. She can follow directions reliably.   Psychiatric:        Mood and Affect: Mood and affect normal.     Recent Labs     Component Value Date/Time   NA 141 12/02/2023 1417   K 4.0 12/02/2023 1417   CL 104 12/02/2023 1417   CO2 23 12/02/2023 1417   GLUCOSE 81 12/02/2023 1417   GLUCOSE 79 10/12/2021 1001   BUN 6 (L) 12/02/2023 1417   CREATININE 0.63 12/02/2023 1417   CALCIUM 9.4 12/02/2023 1417   PROT 6.8 12/11/2022 1030   ALBUMIN 4.3 12/02/2023 1417   AST 16 12/11/2022 1030   ALT 13 12/11/2022 1030   ALKPHOS 105 12/11/2022 1030   BILITOT  0.6 12/11/2022 1030   GFRNONAA >60 10/12/2021 1001    Lab Results  Component Value Date   WBC 7.6 10/12/2021   HGB 11.3 (L) 10/12/2021   HCT 34.9 (L) 10/12/2021   MCV 80.4 10/12/2021   PLT 257 10/12/2021   No results found for: HGBA1C Lab Results  Component Value Date   CHOL 197 12/11/2022   HDL 67 12/11/2022   LDLCALC 115 (H) 12/11/2022   TRIG 82 12/11/2022   No results found for: TSH    Assessment and Plan:  1. Hemiparesis affecting right side as late effect of cerebrovascular accident (CVA) (HCC) (Primary) Continue ASA, atorvastatin, along with other medications as prescribed.  Patient has many interdisciplinary rehabilitation appointments coming up, these were printed with her AVS today as reminder.  Will be checking nonfasting lipids today. - Lipid panel  2. Primary hypertension Seems well-controlled, perhaps borderline hypotensive.  It is not very clear whether or not she feels symptoms of hypotension.  Reduce valsartan  frequency to once daily, but keep the strength the same.  No changes to other antihypertensives.  Will continue clonidine  patches for the time being, seeing as she sometimes resists taking medications by mouth.  Short interval follow-up on this. - valsartan  (DIOVAN ) 160 MG tablet; Take 1 tablet (160 mg total) by mouth daily. - cloNIDine  (CATAPRES  - DOSED IN MG/24 HR) 0.1 mg/24hr patch; Place 1 patch (0.1 mg total) onto the skin once a week.  Dispense: 4 patch; Refill: 2  3. Hypokalemia Repeat CMP today.  Will supplement potassium if needed. - Comprehensive metabolic panel with GFR  4. Elevated LFTs Repeat CMP -  Comprehensive metabolic panel with GFR  5. Peripheral edema Reassured not very concerning.  Will make no increases to diuretic.  Instead elevate the extremities at least once daily with the ankles above the heart. - Comprehensive metabolic panel with GFR  6. Hyperlipidemia, mixed Check nonfasting lipids - Lipid panel    Follow-up 1  month OV HTN   Rolan Hoyle, PA-C, DMSc, Nutritionist [facility] [phone]

## 2024-06-30 ENCOUNTER — Other Ambulatory Visit: Payer: Self-pay | Admitting: Physician Assistant

## 2024-06-30 ENCOUNTER — Ambulatory Visit

## 2024-06-30 ENCOUNTER — Ambulatory Visit: Payer: Self-pay | Admitting: Physician Assistant

## 2024-06-30 ENCOUNTER — Ambulatory Visit: Admitting: Speech Pathology

## 2024-06-30 ENCOUNTER — Ambulatory Visit: Admitting: Occupational Therapy

## 2024-06-30 DIAGNOSIS — I10 Essential (primary) hypertension: Secondary | ICD-10-CM

## 2024-06-30 LAB — COMPREHENSIVE METABOLIC PANEL WITH GFR
ALT: 21 IU/L (ref 0–32)
AST: 18 IU/L (ref 0–40)
Albumin: 4.2 g/dL (ref 3.8–4.8)
Alkaline Phosphatase: 148 IU/L — ABNORMAL HIGH (ref 49–135)
BUN/Creatinine Ratio: 13 (ref 12–28)
BUN: 10 mg/dL (ref 8–27)
Bilirubin Total: 0.6 mg/dL (ref 0.0–1.2)
CO2: 26 mmol/L (ref 20–29)
Calcium: 9.6 mg/dL (ref 8.7–10.3)
Chloride: 99 mmol/L (ref 96–106)
Creatinine, Ser: 0.8 mg/dL (ref 0.57–1.00)
Globulin, Total: 2.9 g/dL (ref 1.5–4.5)
Glucose: 134 mg/dL — ABNORMAL HIGH (ref 70–99)
Potassium: 3.1 mmol/L — ABNORMAL LOW (ref 3.5–5.2)
Sodium: 140 mmol/L (ref 134–144)
Total Protein: 7.1 g/dL (ref 6.0–8.5)
eGFR: 78 mL/min/1.73 (ref >59)

## 2024-06-30 LAB — LIPID PANEL
Chol/HDL Ratio: 2.2 ratio (ref 0.0–4.4)
Cholesterol, Total: 112 mg/dL (ref 100–199)
HDL: 52 mg/dL (ref >39)
LDL Chol Calc (NIH): 45 mg/dL (ref 0–99)
Triglycerides: 75 mg/dL (ref 0–149)
VLDL Cholesterol Cal: 15 mg/dL (ref 5–40)

## 2024-06-30 MED ORDER — SPIRONOLACTONE 25 MG PO TABS: 25.0000 mg | ORAL_TABLET | Freq: Every day | ORAL | 3 refills | Status: AC

## 2024-06-30 NOTE — Progress Notes (Signed)
 Persistent hypokalemia with hydrochlorothiazide . Will stop it and begin spironolactone . Patient's husband [name] informed of the change by phone.

## 2024-07-02 ENCOUNTER — Ambulatory Visit

## 2024-07-02 ENCOUNTER — Ambulatory Visit: Admitting: Speech Pathology

## 2024-07-02 DIAGNOSIS — M6281 Muscle weakness (generalized): Secondary | ICD-10-CM

## 2024-07-02 DIAGNOSIS — R262 Difficulty in walking, not elsewhere classified: Secondary | ICD-10-CM | POA: Insufficient documentation

## 2024-07-02 DIAGNOSIS — R41841 Cognitive communication deficit: Secondary | ICD-10-CM | POA: Insufficient documentation

## 2024-07-02 DIAGNOSIS — R278 Other lack of coordination: Secondary | ICD-10-CM | POA: Diagnosis present

## 2024-07-02 NOTE — Therapy (Signed)
 OUTPATIENT SPEECH LANGUAGE PATHOLOGY  COGNITIVE COMMUNICATION EVALUATION   Patient Name: Abigail Malone MRN: [id number] DOB:[birth year], 73 y.o., female Today's Date: 07/02/2024  PCP: Toribio Hoyle, PA REFERRING PROVIDER: Norleen Punch, MD   End of Session - 07/02/24 1309     Visit Number 1    Number of Visits 25    Date for Recertification  09/24/24    Authorization Type [employer]    Progress Note Due on Visit 10    SLP Start Time 1315    SLP Stop Time  1400    SLP Time Calculation (min) 45 min    Activity Tolerance Patient tolerated treatment well          Past Medical History:  Diagnosis Date   Abnormal uterine bleeding (AUB) 05/15/2016   Allergic rhinitis, seasonal 12/09/2014   Anemia, unspecified 11/18/2013   Atypical ductal hyperplasia of right breast 09/19/2021   Cardiac syncope 05/18/2019   HTN (hypertension) 11/18/2013   Hypertension    Menopausal symptoms 08/05/2014   Right knee pain 07/26/2015   Stroke [facility])    Past Surgical History:  Procedure Laterality Date   BREAST BIOPSY Right 09/07/2021   stereo calcs, ribbon clip, path pending.   BREAST BIOPSY WITH RADIO FREQUENCY LOCALIZER Right 10/18/2021   Procedure: BREAST BIOPSY WITH RADIO FREQUENCY LOCALIZER;  Surgeon: Lane Shope, MD;  Location: [facility] ORS;  Service: General;  Laterality: Right;   BREAST EXCISIONAL BIOPSY Right 2023   COLONOSCOPY  ~2018   Patient Active Problem List   Diagnosis Date Noted   Hemiparesis affecting right side as late effect of cerebrovascular accident (CVA) (HCC) 06/29/2024   Elevated LFTs 06/29/2024   Rotator cuff impingement syndrome, right 02/25/2023   Obesity with body mass index (BMI) of 30.0 to 39.9 01/01/2020   Hyperlipidemia, mixed 05/18/2019   Mixed stress and urge urinary incontinence 04/01/2018   Right knee pain 07/26/2015   Allergic rhinitis, seasonal 12/09/2014   HTN (hypertension) 11/18/2013   Anemia, unspecified 11/18/2013    ONSET  DATE: 04/10/2024: date of referral  06/23/2024  REFERRING DIAG: R47.01 (ICD-10-CM) - Aphasia   THERAPY DIAG:  Cognitive communication deficit  Rationale for Evaluation and Treatment Rehabilitation  SUBJECTIVE:   SUBJECTIVE STATEMENT: Pt quiet, flat affect, frequently starring at her hands, little eye contact with writer, decreased vocal intensity Pt accompanied by: significant other  PERTINENT HISTORY and DIAGNOSTIC FINDINGS: Pt is a 73 year old female with pertinent medical history of HTN who was admitted to [facility] on 04/10/2024 for AMS, found to have a small acute infarct in the left basal ganglia and left corona radiata on MRI.   MRI Brain 04/11/2024: 1. Small acute infarct involving the left basal ganglia and corona radiata. 2. Chronic infarcts in the right basal ganglia and left occipital lobe. 3. 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 Recommend ENT follow-up if this lesion has not already been evaluated.   She was admitted to [facility] from 04/16/24 through 05/06/24, following a left MCA territory ischemic stroke.  MRI 04/21/2024 Multiple small foci of abnormal diffusion restriction in the left caudate body, corona radiata, and superior left lentiform nucleus with corresponding hyperintense T2/FLAIR signal. Chronic infarcts in the right basal ganglia and left occipital lobe. Hyperintense T2/FLAIR signal in the deep and subcortical white matter reflects sequelae of senescent small vessel ischemic disease.Diffuse parenchymal volume loss with corresponding prominence of ventricles and subarachnoid spaces. No abnormal  intracranial enhancement identified.In the left parotid space, contiguous with the retromandibular vein and at least partially involving the deep left parotid lobe, there is an enhancing and T2 hyperintense lesion which measures up to 2.4 x 1.7 cm in maximal axial dimensions. This lesion  demonstrates diffusion restriction.  Pt received HHST services for post-stroke sequelae include mildright hemiparesis, memory loss, cognitive-communication impairment, and reduced functional independence with mobility and ADLs.  PAIN:  Are you having pain? No   FALLS: Has patient fallen in last 6 months?  No  LIVING ENVIRONMENT: Lives with: lives with their spouse Lives in: House/apartment  PLOF:  Level of assistance: Independent with ADLs, Independent with IADLs Employment: Retired   PATIENT GOALS   to unaware to state d/t decreased awareness of deficits, pt's husband states he would like for pt to improve her speech, memory and ability to participate in ADLs and iADLs  OBJECTIVE:   COGNITIVE COMMUNICATION Overall cognitive status: Impaired Areas of impairment:  Attention: Impaired: Sustained, Selective Memory: Impaired: Immediate Working Short term Financial Risk Analyst Awareness: Impaired: Intellectual, Emergent, and Anticipatory Executive function: Impaired: Comment: all impaired by lower level deficits Behavior: Lability Functional Impairments: Pt has difficulty remembering stuff, knowing when she has to use the bathroom so far as urine, dates/days, speech. Pt with no awareness of deficits. When taking her pills she holds the pills or pockets them in her mouth, in the hospital she would swallow the applesauce and pocket the pills, everything else she eats just fine.    Prior to CVA pt was driving, performing household tasks, cooking     AUDITORY COMPREHENSION  Overall auditory comprehension: Appears intact YES/NO questions: Appears intact Following directions: impaired d/t deficits in memory Conversation: pt didn't engage in conversation - single word to 3 word answers in response to direct questions Interfering components: attention, awareness, processing speed, and working memory Effective technique: extra processing time and repetition/stressing  words  READING COMPREHENSION: Impaired: word  EXPRESSION: verbal  VERBAL EXPRESSION:   Overall verbal expression: Impaired: simple Level of generative/spontaneous verbalization: word and phrase Automatic speech: all impaired Repetition: Appears intact Naming: Confrontation: 51-75% and Divergent: 26-50% Pragmatics: Impaired: abnormal effect, dysprosody, eye contact, interpretation of nonverbal communication, and monotone Effective technique: none effective during today's session Non-verbal means of communication: N/A  WRITTEN EXPRESSION: Dominant hand: right Written expression: small letters but legible, able to write provided words  ORAL MOTOR EXAMINATION Facial : WFL Lingual: WFL Velum: WFL Mandible: WFL Cough: WFL Voice: WFL  MOTOR SPEECH: Overall motor speech: impaired Level of impairment: Word Respiration: WFL Phonation: low vocal intensity Resonance: WFL Articulation: Appears intact Intelligibility: Intelligibility reduced - pt's speech is ~ 25% intelligible d/t severely reduced loudness (<50db improving to 60dB with maximal multimodal cues) Motor planning: Appears intact Motor speech errors: unaware Effective technique: increased vocal intensity   STANDARDIZED ASSESSMENTS: Portions of the [place]'s Cognitive Examination - ACE III (American Version B) were given. Unable to complete d/t delayed pt response and time constraints. Pt is oriented x 4, with deficits noted in attention (as related to serial 7's on test), recall of 3 words, difficulty observed with fluency (able to list 6 animals within 1 minute), reading at the single word level, while pt is able to write legibly she is not able to create sentence independently, able to name 8 out of 12 object pictures and deficits in clock drawing (executive function). Pt with no awareness of difficulty.   PATIENT REPORTED OUTCOME MEASURES (PROM): To be completed over the next  3 sessions   TODAY'S TREATMENT:   Skilled education provided to pt severely decreased speech intelligibility, inability to communicate her wants needs. Audio feedback was not helpful in  promoting awareness of soft speech (<50 dB) and with maximal multimodal cues was not helpful in improving speech intensity beyond brief moments at 60 dB).  Pt was able to sequence 3 basic action pictures with good task initiation and accuracy. Her speech was extremely sound with < 25% intelligibility but able to repetition with mildly increased vocal intensity (enough for this writer to make out some of pt's words). Utterances appeared mostly grammatically correct containing 3-4 words per utterance.    PATIENT EDUCATION: Education details: results of this assessment, ST POC Person educated: Patient and Spouse Education method: Explanation Education comprehension: needs further education   HOME EXERCISE PROGRAM:   Increase participation in social activities such as church   GOALS:  Goals reviewed with patient? Yes  SHORT TERM GOALS: Target date: 10 sessions  With maximal cues, pt will demonstrate safe swallow strategies when consuming pills in 5 weeks.  Baseline: Goal status: INITIAL  2.  With moderate cues, pt will complete a basic multi-step task (e.g., making a sandwich) with superivision within 5 weeks.  Baseline:  Goal status: INITIAL  3.  With moderate cues, pt will improve sustained attention to basic tasks for 10 minutes.  Baseline:  Goal status: INITIAL  4.  With maximal cues, pt will sustain phonation for 5-7 seconds at 65 dB in 8 out of 10 opportunities across 4 sessions.  Baseline:  Goal status: INITIAL  5. With maximal cues, pt will initiate commenting on a basic topic 1 times during a structured activity in 5 out of 7 opportunities.  Baseline:   Goal Status: INITIAL   LONG TERM GOALS: Target date: 09/24/2024  With maximal cues, pt will initiate asking a question or basic topic 1 times during a structured  activity in 5 out of 7 opportunities. Baseline:  Goal status: INITIAL  2.  With maximal cues, pt will produce one word utterances at 65 dB in 8 out of 10 opportunities across 5 sessions.  Baseline:  Goal status: INITIAL  3.  With moderate cues, pt will sustain attention to basic task for 15 minutes in 3 out of 4 sessions.  Baseline:  Goal status: INITIAL  ASSESSMENT:  CLINICAL IMPRESSION: Patient is a 73 y.o. female who was seen today for a cognitive communication evaluation d/t acute left basal ganglia stroke.Pt presents with a significant cognitive communication impairment (see above objective information) that is further complicated by severe hypokinetic dysarthria. Her voice is so quiet/soft that she is almost inaudible, when audible, her speech is monotone with suspected difficulty in initiating speech (delay in starting to speak) - her husband reports decreased initiation of spontaneous speech. She appears to have emotional blunting, flat affect, no eye contact (frequently looking down at her hands). As a result, pt is dependent on others for assistance with ADLs and iADLs.    OBJECTIVE IMPAIRMENTS include attention, memory, awareness, executive functioning, expressive language, dysarthria, and dysphagia. These impairments are limiting patient from managing medications, managing appointments, managing finances, household responsibilities, ADLs/IADLs, effectively communicating at home and in community, and safety when swallowing. Factors affecting potential to achieve goals and functional outcome are ability to learn/carryover information, co-morbidities, medical prognosis, previous level of function, severity of impairments, and family/community support. Patient will benefit from skilled SLP services to address above impairments and improve overall function.  REHAB POTENTIAL: Good  PLAN: SLP FREQUENCY: 1-2x/week  SLP DURATION: 12 weeks  PLANNED INTERVENTIONS: Aspiration precaution  training, Diet toleration management , Language facilitation, Environmental controls, Trials of upgraded texture/liquids, Cueing hierachy, Cognitive reorganization, Internal/external aids, Functional tasks, Multimodal communication approach, SLP instruction and feedback, Compensatory strategies, and Patient/family education   Peighton Mehra B. Rubbie, M.S., CCC-SLP, Tree Surgeon Certified Brain Injury Specialist [facility]  [facility] Rehabilitation Services Office [phone] Ascom [phone] Fax [phone]

## 2024-07-02 NOTE — Therapy (Addendum)
 OUTPATIENT OCCUPATIONAL THERAPY NEURO EVALUATION  Patient Name: Abigail Malone MRN: [id number] DOB:1951/01/24, 73 y.o., female Today's Date: 07/06/2024  PCP: Toribio Hoyle, PA REFERRING PROVIDER: Dr. Norleen Punch  END OF SESSION:  OT End of Session - 07/06/24 0934     Visit Number 1    Number of Visits 24    Date for Recertification  09/24/24    OT Start Time 1445    OT Stop Time 1530    OT Time Calculation (min) 45 min    Activity Tolerance Patient tolerated treatment well    Behavior During Therapy Flat affect          Past Medical History:  Diagnosis Date   Abnormal uterine bleeding (AUB) 05/15/2016   Allergic rhinitis, seasonal 12/09/2014   Anemia, unspecified 11/18/2013   Atypical ductal hyperplasia of right breast 09/19/2021   Cardiac syncope 05/18/2019   HTN (hypertension) 11/18/2013   Hypertension    Menopausal symptoms 08/05/2014   Right knee pain 07/26/2015   Stroke [facility])    Past Surgical History:  Procedure Laterality Date   BREAST BIOPSY Right 09/07/2021   stereo calcs, ribbon clip, path pending.   BREAST BIOPSY WITH RADIO FREQUENCY LOCALIZER Right 10/18/2021   Procedure: BREAST BIOPSY WITH RADIO FREQUENCY LOCALIZER;  Surgeon: Lane Shope, MD;  Location: [facility] ORS;  Service: General;  Laterality: Right;   BREAST EXCISIONAL BIOPSY Right 2023   COLONOSCOPY  ~2018   Patient Active Problem List   Diagnosis Date Noted   Hemiparesis affecting right side as late effect of cerebrovascular accident (CVA) (HCC) 06/29/2024   Elevated LFTs 06/29/2024   Rotator cuff impingement syndrome, right 02/25/2023   Obesity with body mass index (BMI) of 30.0 to 39.9 01/01/2020   Hyperlipidemia, mixed 05/18/2019   Mixed stress and urge urinary incontinence 04/01/2018   Right knee pain 07/26/2015   Allergic rhinitis, seasonal 12/09/2014   HTN (hypertension) 11/18/2013   Anemia, unspecified 11/18/2013    ONSET DATE: 03/2024  REFERRING DIAG: Z74.09,Z78.9  (ICD-10-CM) - Impaired mobility and ADLs   THERAPY DIAG:  Muscle weakness (generalized)  Other lack of coordination  Rationale for Evaluation and Treatment: Rehabilitation  SUBJECTIVE:   SUBJECTIVE STATEMENT: Pt acknowledges doing well today. Pt accompanied by: self and significant other (Spouse, [name])  PERTINENT HISTORY: Per EMR: Abigail Malone is a 73 y.o. female with history of left MCA ischemic stroke Sep 2025 with sequelae including mild right hemiparesis, memory loss, cognitive-communication impairment, and reduced functional independence with mobility and ADLs. Per [facility] note 06/16/2024, patient has been referred to PT, OT, cognitive rehab, speech therapy. Most recent visit with primary care was 06/12/2024 by [facility] (Dr. Salli) where HCTZ dose was increased to 25 mg daily, nifedipine was increased to 60 mg daily, and carvedilol  was also increased to 25 mg twice daily. 3 days later she was seen in the ED for altered mental status which was felt to be possibly related to recent medication changes, as the workup was negative for acute findings.   Recent d/c from [facility] post CVA.  PRECAUTIONS: None  WEIGHT BEARING RESTRICTIONS: No  PAIN:  Are you having pain? Yes: NPRS scale: FACES 4/10 at time of eval Pain location: R shoulder injury over a year ago d/t a fall  Pain description: spouse reports sometimes pt yells out if reaching in a cabinet  Aggravating factors: reaching  Relieving factors: pain patch while in hospital, tylenol    FALLS: Has patient fallen in last 6 months? No  LIVING  ENVIRONMENT: Lives with: lives with their spouse Lives in: House/apartment Stairs: 1 Has following equipment at home: built in shower stool within walk in shower, rail outside the shower, rails by both toilets, one toilet is elevated.    PLOF: Independent, retired from administrator, sports, active in community, enjoyed shopping with friends   PATIENT GOALS: Spouse hopeful for pt to regain some indep  with daily tasks  OBJECTIVE:  Note: Objective measures were completed at Evaluation unless otherwise noted.  HAND DOMINANCE: Right  ADLs: Overall ADLs: Spouse providing direct supv and assist d/t cognitive decline post CVA impacting ADL performance  Transfers/ambulation related to ADLs: indep without assistive device  Eating: spouse reports pt is eating with a fork in the R hand ok  Grooming: limited d/t difficulty reaching with the R dominant arm d/t old shoulder injury UB Dressing:  assist to don shirt overhead (old R shoulder injury with increased pain when raising arm) LB Dressing: difficulty reaching down to feet, can not fully lift R leg to cross over L knee; able to cross L leg over R knee to untie L shoe Toileting: assist with changing brief; did not wear a brief before CVA, urinary urgency prior to CVA, but would wear a pad only, not a brief  Bathing: assist with sequencing Tub Shower transfers: SBA Equipment: see above  IADLs: Shopping: Dep Light housekeeping: max A (would need at least supv d/t cognitive impairment) Meal Prep: Supv d/t cognitive deficits, but pt is participating in meal prep Community mobility: indep without AD Medication management: spouse currently Banker: spouse manages Handwriting: 100% legible  POSTURE COMMENTS:  No Significant postural limitations  ACTIVITY TOLERANCE: Activity tolerance: WFL for community mobility and ADLs  UPPER EXTREMITY ROM:    Active ROM Right eval Left eval  Shoulder flexion 82 WNL  Shoulder abduction 60 WNL  Shoulder adduction    Shoulder extension    Shoulder internal rotation WFL WNL  Shoulder external rotation ~ 60* (R hand touches behind head with chin tuck)  WNL  Elbow flexion    Elbow extension    Wrist flexion    Wrist extension    Wrist ulnar deviation    Wrist radial deviation    Wrist pronation    Wrist supination    (Blank rows = not tested)  UPPER EXTREMITY MMT:     MMT  Right Eval Hx of old R shoulder injury/impingement syndrome Left eval  Shoulder flexion 3-  5  Shoulder abduction 3-  5  Shoulder adduction    Shoulder extension    Shoulder internal rotation 4+ 4+  Shoulder external rotation 4 (elbow at side within available range) 4+  Middle trapezius    Lower trapezius    Elbow flexion 4+ 5  Elbow extension 4+ 5  Wrist flexion 4+ 5  Wrist extension 4+ 5  Wrist ulnar deviation    Wrist radial deviation    Wrist pronation    Wrist supination    (Blank rows = not tested)  HAND FUNCTION: Grip strength: Right: 20 lbs; Left: 37 lbs, Lateral pinch: Right: 6 lbs, Left: 9 lbs, and 3 point pinch: Right: 4 lbs, Left: 6 lbs  COORDINATION: Finger Nose Finger test: intact bilaterally  9 Hole Peg test: Right: 40 sec; Left: 33 sec  SENSATION: Unable to accurately assess d/t impaired cognition  EDEMA: No visible edema  MUSCLE TONE: RUE: Within functional limits  COGNITION: Overall cognitive status: Impaired; spouse assists to answer questions accurately  VISION:  Subjective  report: wears glasses at baseline  VISION ASSESSMENT: TBD Patient has difficulty with following activities due to following visual impairments: TBD  PERCEPTION: Not tested  PRAXIS: Impaired: Motor planning, very mild on the R  OBSERVATIONS:  Pt pleasant, cooperative, flat affect, requires increased time for processing and assist from spouse to respond accurately to questions.                                                                                                              TREATMENT DATE: 07/02/24 Evaluation completed.  Self Care: -Reviewed OT role, goals, poc -Recommendation to try using foot stool while seated in chair for propping feet for easier reach when donning socks/shoes -Recommended and reviewed timed voiding strategies with pt and spouse.  Spouse reports that pt did have urinary urgency being addressed by MD prior to CVA, but this has worsened  since CVA and pt is now having to wear a brief.  OT recommended spouse initiate timed voiding every 1-2 hours to reduce urgency and incontinence.  Encouraged spouse alter wording to, It's time to use the bathroom, rather than asking pt if she needs to use the bathroom.  Spouse receptive to recommendations.  PATIENT EDUCATION: Education details: OT role, goals, poc, ADL strategies Person educated: Patient and Spouse Education method: Explanation and Verbal cues Education comprehension: verbalized understanding and needs further education  HOME EXERCISE PROGRAM: To be initiated in upcoming sessions  GOALS: Goals reviewed with patient? Yes  SHORT TERM GOALS: Target date: 08/13/24  Pt will perform HEP for increasing RUE strength and maintaining shoulder mobility for improving ADL performance. Baseline: Eval: Not yet initiated. Goal status: INITIAL   LONG TERM GOALS: Target date: 09/24/24  Pt will increase R grip strength by 15 lbs or more lbs in order to increase ability to hold and stabilize heavy ADL supplies in the R dominant hand. Baseline: Eval: R grip 20 lbs (L 37 lbs) Goal status: INITIAL  2.  Pt will perform bathing tasks with min vc from spouse to ensure thoroughness.  Baseline: Eval: Pt requires at least min A from spouse d/t impaired sequencing, tendency to perseverate on 1 area when washing. Goal status: INITIAL  3.  Pt will improve R hand coordination as demonstrated by completion of 9 Hole peg test in 30 sec or less for improving manipulation of small ADL supplies with R dominant hand. Baseline: Eval: R 40 sec (L 33 sec) Goal status: INITIAL  4.  Pt will improve RUE shoulder mobility to [facility] for enabling pt to independently don shirt overhead.  Baseline: Eval: Spouse assists; old shoulder injury (R shoulder flex 82, abd 60, ER ~60 with chin tuck to touch back of head Goal status: INITIAL  5.  Pt will perform LB ADLs with distant supv.   Baseline: Eval: Mod A d/t  difficulty reaching to feet  Goal status: INITIAL  ASSESSMENT:  CLINICAL IMPRESSION: Patient is a 73 y.o. female who was seen today for occupational therapy evaluation for functional decline related to L MCA CVA in Sept  of this year.  Pt present with spouse today, who is pt's primary caregiver since CVA.  Prior to CVA, pt was indep with all ADL/IADLs and active in community.  Spouse assisting with all ADLs d/t cognitive deficits post CVA currently impacting task initiation and sequencing.  Pt also presents with mild RUE weakness and coordination impairment in R dominant arm, and remote R shoulder impingement syndrome still visibly effecting pt when engaging RUE into ADLs which require reaching at or above shoulder level.  Spouse reports that pt did participate in therapy for R shoulder over a year ago, but that pt still sometimes cries out in pain.  Pt will benefit from review of exercises to maintain R shoulder mobility/decrease pain, establish HEP, to provide exercises and activities for increasing R hand strength and coordination skills, and for ADL training in order to work towards improving indep with daily tasks for reducing burden of care on spouse and improving pt QOL.  Pt/spouse in agreement with plan.     PERFORMANCE DEFICITS: in functional skills including ADLs, IADLs, coordination, dexterity, ROM, strength, pain, flexibility, Fine motor control, Gross motor control, mobility, balance, body mechanics, endurance, decreased knowledge of precautions, decreased knowledge of use of DME, and UE functional use, cognitive skills including attention, problem solving, safety awareness, sequencing, temperament/personality, thought, and understand, and psychosocial skills including coping strategies, environmental adaptation, habits, interpersonal interactions, and routines and behaviors.   IMPAIRMENTS: are limiting patient from ADLs, IADLs, leisure, and social participation.   CO-MORBIDITIES: has  co-morbidities such as R rotator cuff impingement syndrome, HTN that affects occupational performance. Patient will benefit from skilled OT to address above impairments and improve overall function.  MODIFICATION OR ASSISTANCE TO COMPLETE EVALUATION: Min-Moderate modification of tasks or assist with assess necessary to complete an evaluation.  OT OCCUPATIONAL PROFILE AND HISTORY: Detailed assessment: Review of records and additional review of physical, cognitive, psychosocial history related to current functional performance.  CLINICAL DECISION MAKING: Moderate - several treatment options, min-mod task modification necessary  REHAB POTENTIAL: Fair    EVALUATION COMPLEXITY: Moderate    PLAN:  OT FREQUENCY: 1-2x/week  OT DURATION: 12 weeks  PLANNED INTERVENTIONS: 97168 OT Re-evaluation, 97535 self care/ADL training, 02889 therapeutic exercise, 97530 therapeutic activity, 97112 neuromuscular re-education, and 97140 manual therapy  RECOMMENDED OTHER SERVICES: Multi-discipline evaluations today for OT/PT/SLP services  CONSULTED AND AGREED WITH PLAN OF CARE: Patient and family adult nurse (spouse)  PLAN FOR NEXT SESSION: see above  Inocente Blazing, MS, OTR/L   Inocente MARLA Blazing, OT 07/06/2024, 9:35 AM

## 2024-07-02 NOTE — Therapy (Signed)
 OUTPATIENT PHYSICAL THERAPY EVALUATION  Patient Name: Abigail Malone MRN: [id number] DOB:Nov 27, 1950, 73 y.o., female Today's Date: 07/02/2024  PCP: Manya Toribio SQUIBB, PA  REFERRING PROVIDER: Asencion Rush, MD  END OF SESSION:  PT End of Session - 07/02/24 1454     Visit Number 1    Number of Visits 1    Date for Recertification  07/30/24    Authorization Type [employer] PPO    PT Start Time 1400    PT Stop Time 1440    PT Time Calculation (min) 40 min    Activity Tolerance Patient tolerated treatment well    Behavior During Therapy [facility] for tasks assessed/performed          Past Medical History:  Diagnosis Date   Abnormal uterine bleeding (AUB) 05/15/2016   Allergic rhinitis, seasonal 12/09/2014   Anemia, unspecified 11/18/2013   Atypical ductal hyperplasia of right breast 09/19/2021   Cardiac syncope 05/18/2019   HTN (hypertension) 11/18/2013   Hypertension    Menopausal symptoms 08/05/2014   Right knee pain 07/26/2015   Stroke [facility])    Past Surgical History:  Procedure Laterality Date   BREAST BIOPSY Right 09/07/2021   stereo calcs, ribbon clip, path pending.   BREAST BIOPSY WITH RADIO FREQUENCY LOCALIZER Right 10/18/2021   Procedure: BREAST BIOPSY WITH RADIO FREQUENCY LOCALIZER;  Surgeon: Lane Shope, MD;  Location: [facility] ORS;  Service: General;  Laterality: Right;   BREAST EXCISIONAL BIOPSY Right 2023   COLONOSCOPY  ~2018   Patient Active Problem List   Diagnosis Date Noted   Hemiparesis affecting right side as late effect of cerebrovascular accident (CVA) (HCC) 06/29/2024   Elevated LFTs 06/29/2024   Rotator cuff impingement syndrome, right 02/25/2023   Obesity with body mass index (BMI) of 30.0 to 39.9 01/01/2020   Hyperlipidemia, mixed 05/18/2019   Mixed stress and urge urinary incontinence 04/01/2018   Right knee pain 07/26/2015   Allergic rhinitis, seasonal 12/09/2014   HTN (hypertension) 11/18/2013   Anemia, unspecified 11/18/2013    ONSET DATE:  September 2025 REFERRING DIAG: stroke  THERAPY DIAG:  Difficulty in walking, not elsewhere classified  Rationale for Evaluation and Treatment: rehabilitation  SUBJECTIVE:                                                                                                                                                                                             SUBJECTIVE STATEMENT: Pt recently DC from HHPT after 7-8 weeks. She and spouse went to [facility] for ~1 hour recently, she had no refractory imbalance, fatigue, any difficulty with basic mobility at the store.   Pt accompanied by:  Husband  PERTINENT HISTORY:   73yoF who is referred to OPPT for CVA sustained in September. Pt unable to provide many details when asked, details obtained from husband. They offer no specific symptoms related to CVA, but husband reports intermittent confusion as primary symptom he noticed upon return to home. Pt DC from acut ecare to rehab for ~ 2weeks then back home where she took PT, OT, and SLP until end of November. Husband reports pt has intermittent labored efforts coming to standing or getting out of bed, but does only needs physical assist with these occasionally. HHPT issued HEP to address remaining/ongoing deficits and husband reports assisting with performance since DC, no difficulty with completing these. Pt used a 4WW upon return to home only briefly (no premorbid use of device), and has not used any AMD for mobility in the past 6 weeks or more. Pt has sustained no falls. Pt and husband deny any generalized difficulty with balance. Pt reports HHPT helped her work on walking and that she walks fine now- husband concurs walking/balance are WFL, albeit not at baseline. Husband says pt needs most help at this point with remaining weakness in bed mobility and transfers. Pt has not specific goals desired with PT services.   PAIN:  Are you having pain? No  PRECAUTIONS: None  WEIGHT BEARING RESTRICTIONS:  No  FALLS: Has patient fallen in last 6 months? No  LIVING ENVIRONMENT: Lives with: Husband Lives in: 1 level home Stairs: partial single step for entry  Has following equipment at home: exhaustive inventory not collected at evlauation as this has been managed in detail by [facility] services.   PLOF: retired psychologist, occupational, frequently active in the community, shopping with friends, helping with community events, walking with niece.  PATIENT GOALS: None; *see HPI above   OBJECTIVE:  Note: Objective measures were completed at Evaluation unless otherwise noted.  TRANSFERS: Modified independent   GAIT: 968ft AMB overground, steady pacing, no falls, stumbles, sway LOB; 16m54sec; moderately rigid axially, mild global bradykinesia; pt denies any fatigue, pain during AMB.                                                                                                                              TREATMENT DATE 07/02/2024:  -915ft AMB overground, 6 loops in clinic, directions provided for 1st loop only; pt maintains social conversation during 6 loops, attends to objects in the environment, avoids dynamic obstacles. Is more vocal and conversational in this setting and easily able to speak about remote history.  -discussion with caregiver about gradual progression in routine, exercise walking, increasing regula community outings, continuing with HHPT HEP to maximize potential with remaining difficulty with transfers and bed mobility.  -Pt is modI with all mobility, husband is doing great at facilitating household routine, HEP, and starting to engage with activity out of the house.    PATIENT EDUCATION: Education details: *see above Person educated: Husband, patient Education method: research scientist (medical), diplomatic services operational officer,  positive reinforcement, explicit instruction, establish rules. Education comprehension: verbalized understanding  HOME EXERCISE PROGRAM: Defer to recent HEP from HHPT services    GOALS:  SHORT TERM GOALS: Target date: 07/02/24  Established routine of HHPT for transfers and bed mobility strength;  Baseline: Goal status: MET  2.  Education on how to gradually progress pt AMB based on exercise habits as well as meaningful engaging social activity;  Baseline:  Goal status: MET  3.  Pt to be modI for all household mobility to maximize ability to navigate the environment in an exploratory and improvised, problem-solving fashion.  Baseline:  Goal status: MET ASSESSMENT:  CLINICAL IMPRESSION: 73yoF referred to OPPT for CVA sustained in September 2025. Pt demonstrates generalized bradycardia, short term memory limitations, flat affect, independent gait without device, safety concerns, or feelings of impairment. Pt and husband have been working together to gradually advance activity in the community, also compliant with post DC recommendations for household mobility and HEP performance. Pt has no frank skilled needs at this time, generalized fitness and strength needs are being managed well at home.Took time to reaffirm HHPT recommendations, praised current successes so far. All goals of therapy met today, will DC pt to home independent performance.   OBJECTIVE IMPAIRMENTS: Decreased knowledge of condition, decreased use of DME, decreased mobility, difficulty walking, decreased strength, decreased ROM. ACTIVITY LIMITATIONS: Lifting, standing, walking, squatting, transfers, locomotion level PARTICIPATION LIMITATIONS: Cleaning, laundry, interpersonal relationships, driving, yardwork, community activity.  PERSONAL FACTORS: Age, behavior pattern, education, past/current experiences, transportation, profession  are also affecting patient's functional outcome.  REHAB POTENTIAL: Good CLINICAL DECISION MAKING: Medium  EVALUATION COMPLEXITY: Moderate    PLAN:  PT FREQUENCY: 1x  PT DURATION: eval only  PLANNED INTERVENTIONS: 97110-Therapeutic exercises, 97530- Therapeutic  activity, [id number]- Neuromuscular re-education, 97535- Self Care, 02859- Manual therapy, and Patient/Family education  PLAN FOR NEXT SESSION: eval only   Shiann Kam C, PT 07/02/2024, 2:58 PM  3:36 PM, 07/02/2024 Peggye JAYSON Linear, PT, DPT Physical Therapist - [facility]  Outpatient Physical Therapy- Main Campus [phone]

## 2024-07-06 ENCOUNTER — Ambulatory Visit: Admitting: Occupational Therapy

## 2024-07-06 ENCOUNTER — Ambulatory Visit: Admitting: Speech Pathology

## 2024-07-06 ENCOUNTER — Ambulatory Visit: Admitting: Physical Therapy

## 2024-07-06 DIAGNOSIS — M6281 Muscle weakness (generalized): Secondary | ICD-10-CM

## 2024-07-06 DIAGNOSIS — R41841 Cognitive communication deficit: Secondary | ICD-10-CM | POA: Diagnosis not present

## 2024-07-06 NOTE — Therapy (Signed)
 OUTPATIENT OCCUPATIONAL THERAPY NEURO TREATMENT NOTE  Patient Name: Abigail Malone MRN: [id number] DOB:October 14, 1950, 73 y.o., female Today's Date: 07/06/2024  PCP: Abigail Hoyle, PA REFERRING PROVIDER: Dr. Norleen Malone  END OF SESSION:  OT End of Session - 07/06/24 1731     Visit Number 2    Number of Visits 24    Date for Recertification  09/24/24    OT Start Time 1400    OT Stop Time 1445    OT Time Calculation (min) 45 min    Activity Tolerance Patient tolerated treatment well    Behavior During Therapy Flat affect          Past Medical History:  Diagnosis Date   Abnormal uterine bleeding (AUB) 05/15/2016   Allergic rhinitis, seasonal 12/09/2014   Anemia, unspecified 11/18/2013   Atypical ductal hyperplasia of right breast 09/19/2021   Cardiac syncope 05/18/2019   HTN (hypertension) 11/18/2013   Hypertension    Menopausal symptoms 08/05/2014   Right knee pain 07/26/2015   Stroke [facility])    Past Surgical History:  Procedure Laterality Date   BREAST BIOPSY Right 09/07/2021   stereo calcs, ribbon clip, path pending.   BREAST BIOPSY WITH RADIO FREQUENCY LOCALIZER Right 10/18/2021   Procedure: BREAST BIOPSY WITH RADIO FREQUENCY LOCALIZER;  Surgeon: Abigail Shope, MD;  Location: [facility] ORS;  Service: General;  Laterality: Right;   BREAST EXCISIONAL BIOPSY Right 2023   COLONOSCOPY  ~2018   Patient Active Problem List   Diagnosis Date Noted   Hemiparesis affecting right side as late effect of cerebrovascular accident (CVA) (HCC) 06/29/2024   Elevated LFTs 06/29/2024   Rotator cuff impingement syndrome, right 02/25/2023   Obesity with body mass index (BMI) of 30.0 to 39.9 01/01/2020   Hyperlipidemia, mixed 05/18/2019   Mixed stress and urge urinary incontinence 04/01/2018   Right knee pain 07/26/2015   Allergic rhinitis, seasonal 12/09/2014   HTN (hypertension) 11/18/2013   Anemia, unspecified 11/18/2013    ONSET DATE: 03/2024  REFERRING DIAG: Z74.09,Z78.9  (ICD-10-CM) - Impaired mobility and ADLs   THERAPY DIAG:  Muscle weakness (generalized)  Rationale for Evaluation and Treatment: Rehabilitation  SUBJECTIVE:   SUBJECTIVE STATEMENT: Pt.'s husband waited in the waiting room during her therapy visit. Pt accompanied by: self and significant other (Spouse, Abigail Malone)  PERTINENT HISTORY: Per EMR: Abigail Malone is a 73 y.o. female with history of left MCA ischemic stroke Sep 2025 with sequelae including mild right hemiparesis, memory loss, cognitive-communication impairment, and reduced functional independence with mobility and ADLs. Per [facility] note 06/16/2024, patient has been referred to PT, OT, cognitive rehab, speech therapy. Most recent visit with primary care was 06/12/2024 by [facility] clinic (Abigail Malone) where HCTZ dose was increased to 25 mg daily, nifedipine was increased to 60 mg daily, and carvedilol  was also increased to 25 mg twice daily. 3 days later she was seen in the ED for altered mental status which was felt to be possibly related to recent medication changes, as the workup was negative for acute findings.   Recent d/c from [facility] post CVA.  PRECAUTIONS: None  WEIGHT BEARING RESTRICTIONS: No  PAIN:  Are you having pain? Yes: NPRS scale: FACES 4/10 at time of eval Pain location: R shoulder injury over a year ago d/t a fall  Pain description: spouse reports sometimes pt yells out if reaching in a cabinet  Aggravating factors: reaching  Relieving factors: pain patch while in hospital, tylenol    FALLS: Has patient fallen in last 6 months? No  LIVING ENVIRONMENT: Lives with: lives with their spouse Lives in: House/apartment Stairs: 1 Has following equipment at home: built in shower stool within walk in shower, rail outside the shower, rails by both toilets, one toilet is elevated.    PLOF: Independent, retired from administrator, sports, active in community, enjoyed shopping with friends   PATIENT GOALS: Spouse hopeful for pt to regain some  indep with daily tasks  OBJECTIVE:  Note: Objective measures were completed at Evaluation unless otherwise noted.  HAND DOMINANCE: Right  ADLs: Overall ADLs: Spouse providing direct supv and assist d/t cognitive decline post CVA impacting ADL performance  Transfers/ambulation related to ADLs: indep without assistive device  Eating: spouse reports pt is eating with a fork in the R hand ok  Grooming: limited d/t difficulty reaching with the R dominant arm d/t old shoulder injury UB Dressing:  assist to don shirt overhead (old R shoulder injury with increased pain when raising arm) LB Dressing: difficulty reaching down to feet, can not fully lift R leg to cross over L knee; able to cross L leg over R knee to untie L shoe Toileting: assist with changing brief; did not wear a brief before CVA, urinary urgency prior to CVA, but would wear a pad only, not a brief  Bathing: assist with sequencing Tub Shower transfers: SBA Equipment: see above  IADLs: Shopping: Dep Light housekeeping: max A (would need at least supv d/t cognitive impairment) Meal Prep: Supv d/t cognitive deficits, but pt is participating in meal prep Community mobility: indep without AD Medication management: spouse currently Banker: spouse manages Handwriting: 100% legible  POSTURE COMMENTS:  No Significant postural limitations  ACTIVITY TOLERANCE: Activity tolerance: WFL for community mobility and ADLs  UPPER EXTREMITY ROM:    Active ROM Right eval Left eval  Shoulder flexion 82 WNL  Shoulder abduction 60 WNL  Shoulder adduction    Shoulder extension    Shoulder internal rotation WFL WNL  Shoulder external rotation ~ 60* (R hand touches behind head with chin tuck)  WNL  Elbow flexion    Elbow extension    Wrist flexion    Wrist extension    Wrist ulnar deviation    Wrist radial deviation    Wrist pronation    Wrist supination    (Blank rows = not tested)  UPPER EXTREMITY MMT:      MMT Right Eval Hx of old R shoulder injury/impingement syndrome Left eval  Shoulder flexion 3-  5  Shoulder abduction 3-  5  Shoulder adduction    Shoulder extension    Shoulder internal rotation 4+ 4+  Shoulder external rotation 4 (elbow at side within available range) 4+  Middle trapezius    Lower trapezius    Elbow flexion 4+ 5  Elbow extension 4+ 5  Wrist flexion 4+ 5  Wrist extension 4+ 5  Wrist ulnar deviation    Wrist radial deviation    Wrist pronation    Wrist supination    (Blank rows = not tested)  HAND FUNCTION: Grip strength: Right: 20 lbs; Left: 37 lbs, Lateral pinch: Right: 6 lbs, Left: 9 lbs, and 3 point pinch: Right: 4 lbs, Left: 6 lbs  COORDINATION: Finger Nose Finger test: intact bilaterally  9 Hole Peg test: Right: 40 sec; Left: 33 sec  SENSATION: Unable to accurately assess d/t impaired cognition  EDEMA: No visible edema  MUSCLE TONE: RUE: Within functional limits  COGNITION: Overall cognitive status: Impaired; spouse assists to answer questions accurately  VISION:  Subjective report: wears glasses at baseline  VISION ASSESSMENT: TBD Patient has difficulty with following activities due to following visual impairments: TBD  PERCEPTION: Not tested  PRAXIS: Impaired: Motor planning, very mild on the R  OBSERVATIONS:  Pt pleasant, cooperative, flat affect, requires increased time for processing and assist from spouse to respond accurately to questions.                                                                                                              TREATMENT DATE: 07/06/24  Therapeutic Ex.BETHA LONE with PROM to the end range  was performed with the right shoulder. -Pt. Performed AAROM shoulder ROM at the tabletop surface.  Self Care:  -Pt. was independent donning, and doffing her sweater. Occassional assist was required initially with the sleeve when doffing the sweater.  -Pt. Was independent donning and doffing the right  shoe, and sock with increased time using a foot stool.   Therapeutic Activities:   -Pink Theraputty right hand strengthening exercises were performed including: gross grip strengthening, gross digit extension, lateral, and 3pt. pinch strengthening, thumb opposition, bilateral alternating twisting motion in opposite directions with supination/pronation, rolling circular spheres within the tips of the fingers, and manipulating putty within the tips of fingers to remove small objects. Pt. was provided with a visual handout HEP through [employer] with a video access code. However, the exercises provided in the HEP were: gross grip strengthening, lateral pinch, 3pt. Pinch, log rolling, manipulating the putty for small objects, and rolling putty into small spheres.   PATIENT EDUCATION: Education details: OT role, goals, poc, ADL strategies Person educated: Patient and Spouse Education method: Explanation and Verbal cues Education comprehension: verbalized understanding and needs further education  HOME EXERCISE PROGRAM: To be initiated in upcoming sessions  GOALS: Goals reviewed with patient? Yes  SHORT TERM GOALS: Target date: 08/13/24  Pt will perform HEP for increasing RUE strength and maintaining shoulder mobility for improving ADL performance. Baseline: Eval: Not yet initiated. Goal status: INITIAL   LONG TERM GOALS: Target date: 09/24/24  Pt will increase R grip strength by 15 lbs or more lbs in order to increase ability to hold and stabilize heavy ADL supplies in the R dominant hand. Baseline: Eval: R grip 20 lbs (L 37 lbs) Goal status: INITIAL  2.  Pt will perform bathing tasks with min vc from spouse to ensure thoroughness.  Baseline: Eval: Pt requires at least min A from spouse d/t impaired sequencing, tendency to perseverate on 1 area when washing. Goal status: INITIAL  3.  Pt will improve R hand coordination as demonstrated by completion of 9 Hole peg test in 30 sec or less for  improving manipulation of small ADL supplies with R dominant hand. Baseline: Eval: R 40 sec (L 33 sec) Goal status: INITIAL  4.  Pt will improve RUE shoulder mobility to [facility] for enabling pt to independently don shirt overhead.  Baseline: Eval: Spouse assists; old shoulder injury (R shoulder flex 82, abd 60, ER ~60 with chin tuck to  touch back of head Goal status: INITIAL  5.  Pt will perform LB ADLs with distant supv.   Baseline: Eval: Mod A d/t difficulty reaching to feet  Goal status: INITIAL  ASSESSMENT:  CLINICAL IMPRESSION:   Pt. tolerated the right shoulder exercises, and table glide exercises with assist proximally at the RUE. Pt. Required assist initially to doff the sleeve on her sweater 2/2 friction from the sweater, and shirt. Pt. was independent with donning, and doffing the right shoe, and sock using a foot stool. Pt. required increased time to complete the task. Pt. Required tactile cues, and cues for visual demonstration for each of the theraputty exercises. Pt. will require additional review, with remaining exercises added to the HEP. Pt. continues to benefit from review of exercises to maintain R shoulder mobility/decrease pain, establish HEP, to provide exercises and activities for increasing R hand strength and coordination skills, and for ADL training in order to work towards improving indep with daily tasks for reducing burden of care on spouse and improving pt QOL.  Pt/spouse in agreement with plan.     PERFORMANCE DEFICITS: in functional skills including ADLs, IADLs, coordination, dexterity, ROM, strength, pain, flexibility, Fine motor control, Gross motor control, mobility, balance, body mechanics, endurance, decreased knowledge of precautions, decreased knowledge of use of DME, and UE functional use, cognitive skills including attention, problem solving, safety awareness, sequencing, temperament/personality, thought, and understand, and psychosocial skills including coping  strategies, environmental adaptation, habits, interpersonal interactions, and routines and behaviors.   IMPAIRMENTS: are limiting patient from ADLs, IADLs, leisure, and social participation.   CO-MORBIDITIES: has co-morbidities such as R rotator cuff impingement syndrome, HTN that affects occupational performance. Patient will benefit from skilled OT to address above impairments and improve overall function.  MODIFICATION OR ASSISTANCE TO COMPLETE EVALUATION: Min-Moderate modification of tasks or assist with assess necessary to complete an evaluation.  OT OCCUPATIONAL PROFILE AND HISTORY: Detailed assessment: Review of records and additional review of physical, cognitive, psychosocial history related to current functional performance.  CLINICAL DECISION MAKING: Moderate - several treatment options, min-mod task modification necessary  REHAB POTENTIAL: Fair    EVALUATION COMPLEXITY: Moderate    PLAN:  OT FREQUENCY: 1-2x/week  OT DURATION: 12 weeks  PLANNED INTERVENTIONS: 97168 OT Re-evaluation, 97535 self care/ADL training, 02889 therapeutic exercise, 97530 therapeutic activity, 97112 neuromuscular re-education, and 97140 manual therapy  RECOMMENDED OTHER SERVICES: Multi-discipline evaluations today for OT/PT/SLP services  CONSULTED AND AGREED WITH PLAN OF CARE: Patient and family adult nurse (spouse)  PLAN FOR NEXT SESSION: see above  Richardson Otter, MS, OTR/L  07/06/2024, 5:33 PM

## 2024-07-06 NOTE — Addendum Note (Signed)
 Addended by: Deserie Dirks K on: 07/06/2024 01:54 PM   Modules accepted: Orders

## 2024-07-06 NOTE — Therapy (Signed)
 OUTPATIENT SPEECH LANGUAGE PATHOLOGY  COGNITIVE COMMUNICATION  TREATMENT NOTE   Patient Name: [name] MRN: [id number] DOB:05-24-1951, 73 y.o., female Today's Date: 07/06/2024  PCP: Abigail Hoyle, PA REFERRING PROVIDER: Norleen Punch, MD   End of Session - 07/06/24 1458     Visit Number 2    Number of Visits 25    Date for Recertification  09/24/24    Authorization Type [employer]    Progress Note Due on Visit 10    SLP Start Time 1445    SLP Stop Time  1530    SLP Time Calculation (min) 45 min    Activity Tolerance Patient tolerated treatment well          Past Medical History:  Diagnosis Date   Abnormal uterine bleeding (AUB) 05/15/2016   Allergic rhinitis, seasonal 12/09/2014   Anemia, unspecified 11/18/2013   Atypical ductal hyperplasia of right breast 09/19/2021   Cardiac syncope 05/18/2019   HTN (hypertension) 11/18/2013   Hypertension    Menopausal symptoms 08/05/2014   Right knee pain 07/26/2015   Stroke [facility])    Past Surgical History:  Procedure Laterality Date   BREAST BIOPSY Right 09/07/2021   stereo calcs, ribbon clip, path pending.   BREAST BIOPSY WITH RADIO FREQUENCY LOCALIZER Right 10/18/2021   Procedure: BREAST BIOPSY WITH RADIO FREQUENCY LOCALIZER;  Surgeon: Abigail Shope, MD;  Location: [facility] ORS;  Service: General;  Laterality: Right;   BREAST EXCISIONAL BIOPSY Right 2023   COLONOSCOPY  ~2018   Patient Active Problem List   Diagnosis Date Noted   Hemiparesis affecting right side as late effect of cerebrovascular accident (CVA) (HCC) 06/29/2024   Elevated LFTs 06/29/2024   Rotator cuff impingement syndrome, right 02/25/2023   Obesity with body mass index (BMI) of 30.0 to 39.9 01/01/2020   Hyperlipidemia, mixed 05/18/2019   Mixed stress and urge urinary incontinence 04/01/2018   Right knee pain 07/26/2015   Allergic rhinitis, seasonal 12/09/2014   HTN (hypertension) 11/18/2013   Anemia, unspecified 11/18/2013     ONSET DATE: 04/10/2024: date of referral  06/23/2024  REFERRING DIAG: R47.01 (ICD-10-CM) - Aphasia   THERAPY DIAG:  Cognitive communication deficit  Rationale for Evaluation and Treatment Rehabilitation  SUBJECTIVE:   PERTINENT HISTORY and DIAGNOSTIC FINDINGS: Pt is a 72 year old female with pertinent medical history of HTN who was admitted to [facility] on 04/10/2024 for AMS, found to have a small acute infarct in the left basal ganglia and left corona radiata on MRI.   MRI Brain 04/11/2024: 1. Small acute infarct involving the left basal ganglia and corona radiata. 2. Chronic infarcts in the right basal ganglia and left occipital lobe. 3. 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 Recommend ENT follow-up if this lesion has not already been evaluated.   She was admitted to [facility] from 04/16/24 through 05/06/24, following a left MCA territory ischemic stroke.  MRI 04/21/2024 Multiple small foci of abnormal diffusion restriction in the left caudate body, corona radiata, and superior left lentiform nucleus with corresponding hyperintense T2/FLAIR signal. Chronic infarcts in the right basal ganglia and left occipital lobe. Hyperintense T2/FLAIR signal in the deep and subcortical white matter reflects sequelae of senescent small vessel ischemic disease.Diffuse parenchymal volume loss with corresponding prominence of ventricles and subarachnoid spaces. No abnormal intracranial enhancement identified.In the left parotid space, contiguous with the retromandibular vein and at least partially involving the deep left parotid lobe, there  is an enhancing and T2 hyperintense lesion which measures up to 2.4 x 1.7 cm in maximal axial dimensions. This lesion demonstrates diffusion restriction.  Pt received HHST services for post-stroke sequelae include mildright hemiparesis, memory loss, cognitive-communication impairment,  and reduced functional independence with mobility and ADLs.  PAIN:  Are you having pain? No   FALLS: Has patient fallen in last 6 months?  No  LIVING ENVIRONMENT: Lives with: lives with their spouse Lives in: House/apartment  PLOF:  Level of assistance: Independent with ADLs, Independent with IADLs Employment: Retired   PATIENT GOALS   to unaware to state d/t decreased awareness of deficits, pt's husband states he would like for pt to improve her speech, memory and ability to participate in ADLs and iADLs  SUBJECTIVE STATEMENT: Pt quiet but increased participation and initiation during today's session Pt accompanied by: alone, her husband stayed in the waiting area   OBJECTIVE:   TODAY'S TREATMENT:  Skilled treatment session targeted pt's cognitive communication goals. SLP facilitated session by providing the following interventions:  Pt attended session by herself. She presented with increased vocal intensity and therefore increased speech intelligibility. Pt with decreased response time and task initiation.   This clinical research associate provided instruction in novel basic problem solving task. Pt able to complete with 100% accuracy, good sustained attention for 30 minutes.  Pt also able to find bathroom and waiting room independently. Pt observed with increased facial expression as well as turn taking in conversation about items she makes for the holidays. She states that she likes coming out for therapy.    PATIENT REPORTED OUTCOME MEASURES (PROM): To be completed over the next 3 sessions    PATIENT EDUCATION: Education details: results of this assessment, ST POC Person educated: Patient and Spouse Education method: Explanation Education comprehension: needs further education   HOME EXERCISE PROGRAM:   Increase participation in social activities such as church   GOALS:  Goals reviewed with patient? Yes  SHORT TERM GOALS: Target date: 10 sessions  With maximal cues, pt  will demonstrate safe swallow strategies when consuming pills in 5 weeks.  Baseline: Goal status: INITIAL  2.  With moderate cues, pt will complete a basic multi-step task (e.g., making a sandwich) with superivision within 5 weeks.  Baseline:  Goal status: INITIAL  3.  With moderate cues, pt will improve sustained attention to basic tasks for 10 minutes.  Baseline:  Goal status: INITIAL  4.  With maximal cues, pt will sustain phonation for 5-7 seconds at 65 dB in 8 out of 10 opportunities across 4 sessions.  Baseline:  Goal status: INITIAL  5. With maximal cues, pt will initiate commenting on a basic topic 1 times during a structured activity in 5 out of 7 opportunities.  Baseline:   Goal Status: INITIAL   LONG TERM GOALS: Target date: 09/24/2024  With maximal cues, pt will initiate asking a question or basic topic 1 times during a structured activity in 5 out of 7 opportunities. Baseline:  Goal status: INITIAL  2.  With maximal cues, pt will produce one word utterances at 65 dB in 8 out of 10 opportunities across 5 sessions.  Baseline:  Goal status: INITIAL  3.  With moderate cues, pt will sustain attention to basic task for 15 minutes in 3 out of 4 sessions.  Baseline:  Goal status: INITIAL  ASSESSMENT:  CLINICAL IMPRESSION: Patient is a 73 y.o. 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 Her voice is so quiet/soft that she is almost inaudible, when audible, her speech is monotone with suspected difficulty in initiating speech (delay in starting to speak) - her husband reports decreased initiation of spontaneous speech. She appears to have emotional blunting, flat affect, no eye contact (frequently looking down at her hands). As a result, pt is dependent on  others for assistance with ADLs and iADLs.    OBJECTIVE IMPAIRMENTS include attention, memory, awareness, executive functioning, expressive language, dysarthria, and dysphagia. These impairments are limiting patient from managing medications, managing appointments, managing finances, household responsibilities, ADLs/IADLs, effectively communicating at home and in community, and safety when swallowing. Factors affecting potential to achieve goals and functional outcome are ability to learn/carryover information, co-morbidities, medical prognosis, previous level of function, severity of impairments, and family/community support. Patient will benefit from skilled SLP services to address above impairments and improve overall function.  REHAB POTENTIAL: Good  PLAN: SLP FREQUENCY: 1-2x/week  SLP DURATION: 12 weeks  PLANNED INTERVENTIONS: Aspiration precaution training, Diet toleration management , Language facilitation, Environmental controls, Trials of upgraded texture/liquids, Cueing hierachy, Cognitive reorganization, Internal/external aids, Functional tasks, Multimodal communication approach, SLP instruction and feedback, Compensatory strategies, and Patient/family education   Rudell Ortman B. Rubbie, M.S., CCC-SLP, Tree Surgeon Certified Brain Injury Specialist [facility]  [facility] Rehabilitation Services Office [phone] Ascom [phone] Fax [phone]

## 2024-07-10 ENCOUNTER — Telehealth: Payer: Self-pay | Admitting: Speech Pathology

## 2024-07-10 ENCOUNTER — Ambulatory Visit

## 2024-07-10 ENCOUNTER — Ambulatory Visit: Admitting: Speech Pathology

## 2024-07-10 ENCOUNTER — Ambulatory Visit: Admitting: Physical Therapy

## 2024-07-10 NOTE — Telephone Encounter (Signed)
 Pt was a no call/no show for her OT and ST sessions today. This clinical research associate called and left a voice mail regarding missed appts with request for them to give us  a call to confirm upcoming appt on Tuesday December 23rd.   Kerrigan Glendening B. Rubbie, M.S., CCC-SLP, Tree Surgeon Certified Brain Injury Specialist [facility]  [facility] Rehabilitation Services Office [phone] Ascom [phone] Fax [phone]

## 2024-07-13 ENCOUNTER — Ambulatory Visit: Admitting: Speech Pathology

## 2024-07-13 ENCOUNTER — Ambulatory Visit: Admitting: Occupational Therapy

## 2024-07-13 ENCOUNTER — Ambulatory Visit

## 2024-07-14 ENCOUNTER — Ambulatory Visit: Admitting: Occupational Therapy

## 2024-07-14 ENCOUNTER — Ambulatory Visit: Admitting: Speech Pathology

## 2024-07-14 DIAGNOSIS — M6281 Muscle weakness (generalized): Secondary | ICD-10-CM

## 2024-07-14 DIAGNOSIS — R41841 Cognitive communication deficit: Secondary | ICD-10-CM | POA: Diagnosis not present

## 2024-07-14 NOTE — Therapy (Signed)
 " OUTPATIENT OCCUPATIONAL THERAPY NEURO TREATMENT NOTE  Patient Name: Abigail Malone MRN: [id number] DOB:[birth year], 73 y.o., female Today's Date: 07/14/2024  PCP: Toribio Hoyle, PA REFERRING PROVIDER: Dr. Norleen Punch  END OF SESSION:  OT End of Session - 07/14/24 1758     Visit Number 3    Number of Visits 24    Date for Recertification  09/24/24    OT Start Time 1445    OT Stop Time 1530    OT Time Calculation (min) 45 min    Activity Tolerance Patient tolerated treatment well    Behavior During Therapy Flat affect          Past Medical History:  Diagnosis Date   Abnormal uterine bleeding (AUB) 05/15/2016   Allergic rhinitis, seasonal 12/09/2014   Anemia, unspecified 11/18/2013   Atypical ductal hyperplasia of right breast 09/19/2021   Cardiac syncope 05/18/2019   HTN (hypertension) 11/18/2013   Hypertension    Menopausal symptoms 08/05/2014   Right knee pain 07/26/2015   Stroke [facility])    Past Surgical History:  Procedure Laterality Date   BREAST BIOPSY Right 09/07/2021   stereo calcs, ribbon clip, path pending.   BREAST BIOPSY WITH RADIO FREQUENCY LOCALIZER Right 10/18/2021   Procedure: BREAST BIOPSY WITH RADIO FREQUENCY LOCALIZER;  Surgeon: Lane Shope, MD;  Location: [facility] ORS;  Service: General;  Laterality: Right;   BREAST EXCISIONAL BIOPSY Right 2023   COLONOSCOPY  ~2018   Patient Active Problem List   Diagnosis Date Noted   Hemiparesis affecting right side as late effect of cerebrovascular accident (CVA) (HCC) 06/29/2024   Elevated LFTs 06/29/2024   Rotator cuff impingement syndrome, right 02/25/2023   Obesity with body mass index (BMI) of 30.0 to 39.9 01/01/2020   Hyperlipidemia, mixed 05/18/2019   Mixed stress and urge urinary incontinence 04/01/2018   Right knee pain 07/26/2015   Allergic rhinitis, seasonal 12/09/2014   HTN (hypertension) 11/18/2013   Anemia, unspecified 11/18/2013    ONSET DATE: 03/2024  REFERRING DIAG: Z74.09,Z78.9  (ICD-10-CM) - Impaired mobility and ADLs   THERAPY DIAG:  Muscle weakness (generalized)  Rationale for Evaluation and Treatment: Rehabilitation  SUBJECTIVE:   SUBJECTIVE STATEMENT: Pt.'s husband  reports that he forgot about her last scheduled appointment, and reports they would have been here if the new about it.  Pt accompanied by: self and significant other (Spouse, [name])  PERTINENT HISTORY: Per EMR: Abigail Malone is a 74 y.o. female with history of left MCA ischemic stroke Sep 2025 with sequelae including mild right hemiparesis, memory loss, cognitive-communication impairment, and reduced functional independence with mobility and ADLs. Per [facility] note 06/16/2024, patient has been referred to PT, OT, cognitive rehab, speech therapy. Most recent visit with primary care was 06/12/2024 by [facility] (Dr. Salli) where HCTZ dose was increased to 25 mg daily, nifedipine was increased to 60 mg daily, and carvedilol  was also increased to 25 mg twice daily. 3 days later she was seen in the ED for altered mental status which was felt to be possibly related to recent medication changes, as the workup was negative for acute findings.   Recent d/c from [facility] post CVA.  PRECAUTIONS: None  WEIGHT BEARING RESTRICTIONS: No  PAIN:  Are you having pain? Yes: NPRS scale: FACES 4/10 at time of eval Pain location: R shoulder injury over a year ago d/t a fall  Pain description: spouse reports sometimes pt yells out if reaching in a cabinet  Aggravating factors: reaching  Relieving factors: pain patch  while in hospital, tylenol    FALLS: Has patient fallen in last 6 months? No  LIVING ENVIRONMENT: Lives with: lives with their spouse Lives in: House/apartment Stairs: 1 Has following equipment at home: built in shower stool within walk in shower, rail outside the shower, rails by both toilets, one toilet is elevated.    PLOF: Independent, retired from administrator, sports, active in community, enjoyed shopping  with friends   PATIENT GOALS: Spouse hopeful for pt to regain some indep with daily tasks  OBJECTIVE:  Note: Objective measures were completed at Evaluation unless otherwise noted.  HAND DOMINANCE: Right  ADLs: Overall ADLs: Spouse providing direct supv and assist d/t cognitive decline post CVA impacting ADL performance  Transfers/ambulation related to ADLs: indep without assistive device  Eating: spouse reports pt is eating with a fork in the R hand ok  Grooming: limited d/t difficulty reaching with the R dominant arm d/t old shoulder injury UB Dressing:  assist to don shirt overhead (old R shoulder injury with increased pain when raising arm) LB Dressing: difficulty reaching down to feet, can not fully lift R leg to cross over L knee; able to cross L leg over R knee to untie L shoe Toileting: assist with changing brief; did not wear a brief before CVA, urinary urgency prior to CVA, but would wear a pad only, not a brief  Bathing: assist with sequencing Tub Shower transfers: SBA Equipment: see above  IADLs: Shopping: Dep Light housekeeping: max A (would need at least supv d/t cognitive impairment) Meal Prep: Supv d/t cognitive deficits, but pt is participating in meal prep Community mobility: indep without AD Medication management: spouse currently Banker: spouse manages Handwriting: 100% legible  POSTURE COMMENTS:  No Significant postural limitations  ACTIVITY TOLERANCE: Activity tolerance: WFL for community mobility and ADLs  UPPER EXTREMITY ROM:    Active ROM Right eval Left eval  Shoulder flexion 82 WNL  Shoulder abduction 60 WNL  Shoulder adduction    Shoulder extension    Shoulder internal rotation WFL WNL  Shoulder external rotation ~ 60* (R hand touches behind head with chin tuck)  WNL  Elbow flexion    Elbow extension    Wrist flexion    Wrist extension    Wrist ulnar deviation    Wrist radial deviation    Wrist pronation    Wrist  supination    (Blank rows = not tested)  UPPER EXTREMITY MMT:     MMT Right Eval Hx of old R shoulder injury/impingement syndrome Left eval  Shoulder flexion 3-  5  Shoulder abduction 3-  5  Shoulder adduction    Shoulder extension    Shoulder internal rotation 4+ 4+  Shoulder external rotation 4 (elbow at side within available range) 4+  Middle trapezius    Lower trapezius    Elbow flexion 4+ 5  Elbow extension 4+ 5  Wrist flexion 4+ 5  Wrist extension 4+ 5  Wrist ulnar deviation    Wrist radial deviation    Wrist pronation    Wrist supination    (Blank rows = not tested)  HAND FUNCTION: Grip strength: Right: 20 lbs; Left: 37 lbs, Lateral pinch: Right: 6 lbs, Left: 9 lbs, and 3 point pinch: Right: 4 lbs, Left: 6 lbs  COORDINATION: Finger Nose Finger test: intact bilaterally  9 Hole Peg test: Right: 40 sec; Left: 33 sec  SENSATION: Unable to accurately assess d/t impaired cognition  EDEMA: No visible edema  MUSCLE TONE: RUE: Within functional  limits  COGNITION: Overall cognitive status: Impaired; spouse assists to answer questions accurately  VISION:  Subjective report: wears glasses at baseline  VISION ASSESSMENT: TBD Patient has difficulty with following activities due to following visual impairments: TBD  PERCEPTION: Not tested  PRAXIS: Impaired: Motor planning, very mild on the R  OBSERVATIONS:  Pt pleasant, cooperative, flat affect, requires increased time for processing and assist from spouse to respond accurately to questions.                                                                                                              TREATMENT DATE: 07/14/24  Therapeutic Ex.BETHA LONE with PROM to the end range  was performed with the right shoulder. -Pt. Performed AAROM shoulder ROM at the tabletop surface.  Self Care:  -Pt. was independent donning, and doffing her sweater. Occassional assist was required initially with the sleeve when  doffing the sweater.  -Pt. Was independent donning and doffing the right shoe, and sock with increased time using a foot stool.   Therapeutic Activities:   -Pink Theraputty right hand strengthening exercises were performed including: gross grip strengthening, lateral, 2pt. Pinch, and 3pt. pinch strengthening, thumb, and manipulating putty within the tips of fingers to remove small coins. The HEP was reviewed with the Pt. for gross grip strengthening, lateral pinch, 3pt. Pinch, 2pt. Pinch strengthening, log rolling, manipulating the putty for small objects, and rolling putty into small spheres.   PATIENT EDUCATION: Education details: OT role, goals, poc, ADL strategies Person educated: Patient and Spouse Education method: Explanation and Verbal cues Education comprehension: verbalized understanding and needs further education  HOME EXERCISE PROGRAM: To be initiated in upcoming sessions  GOALS: Goals reviewed with patient? Yes  SHORT TERM GOALS: Target date: 08/13/24  Pt will perform HEP for increasing RUE strength and maintaining shoulder mobility for improving ADL performance. Baseline: Eval: Not yet initiated. Goal status: INITIAL   LONG TERM GOALS: Target date: 09/24/24  Pt will increase R grip strength by 15 lbs or more lbs in order to increase ability to hold and stabilize heavy ADL supplies in the R dominant hand. Baseline: Eval: R grip 20 lbs (L 37 lbs) Goal status: INITIAL  2.  Pt will perform bathing tasks with min vc from spouse to ensure thoroughness.  Baseline: Eval: Pt requires at least min A from spouse d/t impaired sequencing, tendency to perseverate on 1 area when washing. Goal status: INITIAL  3.  Pt will improve R hand coordination as demonstrated by completion of 9 Hole peg test in 30 sec or less for improving manipulation of small ADL supplies with R dominant hand. Baseline: Eval: R 40 sec (L 33 sec) Goal status: INITIAL  4.  Pt will improve RUE shoulder  mobility to [facility] for enabling pt to independently don shirt overhead.  Baseline: Eval: Spouse assists; old shoulder injury (R shoulder flex 82, abd 60, ER ~60 with chin tuck to touch back of head Goal status: INITIAL  5.  Pt will perform LB ADLs with  distant supv.   Baseline: Eval: Mod A d/t difficulty reaching to feet  Goal status: INITIAL  ASSESSMENT:  CLINICAL IMPRESSION:  Pt. continues to tolerate right shoulder exercises, and the  table glide exercises, and require d less assist proximally at the RUE today. No reports, or facial indications of pain with the exercises. Reviewed with HEP program for hand strengthening with pink theraputty. Pt. Presents with limited initiation of conversation, however Pt. was able to respond more to questions today. Pt. continues to benefit from review of exercises to maintain R shoulder mobility/decrease pain, establish HEP, to provide exercises and activities for increasing R hand strength and coordination skills, and for ADL training in order to work towards improving indep with daily tasks for reducing burden of care on spouse and improving pt QOL.  Pt/spouse in agreement with plan.     PERFORMANCE DEFICITS: in functional skills including ADLs, IADLs, coordination, dexterity, ROM, strength, pain, flexibility, Fine motor control, Gross motor control, mobility, balance, body mechanics, endurance, decreased knowledge of precautions, decreased knowledge of use of DME, and UE functional use, cognitive skills including attention, problem solving, safety awareness, sequencing, temperament/personality, thought, and understand, and psychosocial skills including coping strategies, environmental adaptation, habits, interpersonal interactions, and routines and behaviors.   IMPAIRMENTS: are limiting patient from ADLs, IADLs, leisure, and social participation.   CO-MORBIDITIES: has co-morbidities such as R rotator cuff impingement syndrome, HTN that affects occupational  performance. Patient will benefit from skilled OT to address above impairments and improve overall function.  MODIFICATION OR ASSISTANCE TO COMPLETE EVALUATION: Min-Moderate modification of tasks or assist with assess necessary to complete an evaluation.  OT OCCUPATIONAL PROFILE AND HISTORY: Detailed assessment: Review of records and additional review of physical, cognitive, psychosocial history related to current functional performance.  CLINICAL DECISION MAKING: Moderate - several treatment options, min-mod task modification necessary  REHAB POTENTIAL: Fair    EVALUATION COMPLEXITY: Moderate    PLAN:  OT FREQUENCY: 1-2x/week  OT DURATION: 12 weeks  PLANNED INTERVENTIONS: 97168 OT Re-evaluation, 97535 self care/ADL training, 02889 therapeutic exercise, 97530 therapeutic activity, 97112 neuromuscular re-education, and 97140 manual therapy  RECOMMENDED OTHER SERVICES: Multi-discipline evaluations today for OT/PT/SLP services  CONSULTED AND AGREED WITH PLAN OF CARE: Patient and family adult nurse (spouse)  PLAN FOR NEXT SESSION: see above  Richardson Otter, MS, OTR/L  07/14/2024, 6:03 PM    "

## 2024-07-14 NOTE — Therapy (Signed)
 " OUTPATIENT SPEECH LANGUAGE PATHOLOGY  COGNITIVE COMMUNICATION  TREATMENT NOTE   Patient Name: Abigail Malone MRN: [id number] DOB:Sep 22, 1950, 73 y.o., female Today's Date: 07/14/2024  PCP: Toribio Hoyle, PA REFERRING PROVIDER: Norleen Punch, MD   End of Session - 07/14/24 1504     Visit Number 3    Number of Visits 25    Date for Recertification  09/24/24    Authorization Type [employer]    Progress Note Due on Visit 10    SLP Start Time 1530    SLP Stop Time  1615    SLP Time Calculation (min) 45 min    Activity Tolerance Patient tolerated treatment well          Past Medical History:  Diagnosis Date   Abnormal uterine bleeding (AUB) 05/15/2016   Allergic rhinitis, seasonal 12/09/2014   Anemia, unspecified 11/18/2013   Atypical ductal hyperplasia of right breast 09/19/2021   Cardiac syncope 05/18/2019   HTN (hypertension) 11/18/2013   Hypertension    Menopausal symptoms 08/05/2014   Right knee pain 07/26/2015   Stroke [facility])    Past Surgical History:  Procedure Laterality Date   BREAST BIOPSY Right 09/07/2021   stereo calcs, ribbon clip, path pending.   BREAST BIOPSY WITH RADIO FREQUENCY LOCALIZER Right 10/18/2021   Procedure: BREAST BIOPSY WITH RADIO FREQUENCY LOCALIZER;  Surgeon: Lane Shope, MD;  Location: [facility] ORS;  Service: General;  Laterality: Right;   BREAST EXCISIONAL BIOPSY Right 2023   COLONOSCOPY  ~2018   Patient Active Problem List   Diagnosis Date Noted   Hemiparesis affecting right side as late effect of cerebrovascular accident (CVA) (HCC) 06/29/2024   Elevated LFTs 06/29/2024   Rotator cuff impingement syndrome, right 02/25/2023   Obesity with body mass index (BMI) of 30.0 to 39.9 01/01/2020   Hyperlipidemia, mixed 05/18/2019   Mixed stress and urge urinary incontinence 04/01/2018   Right knee pain 07/26/2015   Allergic rhinitis, seasonal 12/09/2014   HTN (hypertension) 11/18/2013   Anemia, unspecified 11/18/2013     ONSET DATE: 04/10/2024: date of referral  06/23/2024  REFERRING DIAG: R47.01 (ICD-10-CM) - Aphasia   THERAPY DIAG:  Cognitive communication deficit  Rationale for Evaluation and Treatment Rehabilitation  SUBJECTIVE:   PERTINENT HISTORY and DIAGNOSTIC FINDINGS: Pt is a 73 year old female with pertinent medical history of HTN who was admitted to [facility] on 04/10/2024 for AMS, found to have a small acute infarct in the left basal ganglia and left corona radiata on MRI.   MRI Brain 04/11/2024: 1. Small acute infarct involving the left basal ganglia and corona radiata. 2. Chronic infarcts in the right basal ganglia and left occipital lobe. 3. 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 Recommend ENT follow-up if this lesion has not already been evaluated.   She was admitted to [facility] from 04/16/24 through 05/06/24, following a left MCA territory ischemic stroke.  MRI 04/21/2024 Multiple small foci of abnormal diffusion restriction in the left caudate body, corona radiata, and superior left lentiform nucleus with corresponding hyperintense T2/FLAIR signal. Chronic infarcts in the right basal ganglia and left occipital lobe. Hyperintense T2/FLAIR signal in the deep and subcortical white matter reflects sequelae of senescent small vessel ischemic disease.Diffuse parenchymal volume loss with corresponding prominence of ventricles and subarachnoid spaces. No abnormal intracranial enhancement identified.In the left parotid space, contiguous with the retromandibular vein and at least partially involving the deep left parotid lobe,  there is an enhancing and T2 hyperintense lesion which measures up to 2.4 x 1.7 cm in maximal axial dimensions. This lesion demonstrates diffusion restriction.  Pt received HHST services for post-stroke sequelae include mildright hemiparesis, memory loss, cognitive-communication impairment,  and reduced functional independence with mobility and ADLs.  PAIN:  Are you having pain? No   FALLS: Has patient fallen in last 6 months?  No  LIVING ENVIRONMENT: Lives with: lives with their spouse Lives in: House/apartment  PLOF:  Level of assistance: Independent with ADLs, Independent with IADLs Employment: Retired   PATIENT GOALS   to unaware to state d/t decreased awareness of deficits, pt's husband states he would like for pt to improve her speech, memory and ability to participate in ADLs and iADLs  SUBJECTIVE STATEMENT: Pt quiet but increased participation and initiation during today's session Pt accompanied by: alone, her husband stayed in the waiting area   OBJECTIVE:   TODAY'S TREATMENT:  Skilled treatment session targeted pt's cognitive communication goals. SLP facilitated session by providing the following interventions:  Pt with improved physical task initiation but continued extended verbal response times. Pt observed with full phonation but reduced vocal intensity. Pt will reduced speech intelligibility for ~ 25% as a result of reduced vocal intensity.   Pt able to learn a novel basi problem solving game with good independence progressing to rare Min A when task complexity increased.    PATIENT REPORTED OUTCOME MEASURES (PROM): To be completed over the next 3 sessions    PATIENT EDUCATION: Education details: results of this assessment, ST POC Person educated: Patient and Spouse Education method: Explanation Education comprehension: needs further education   HOME EXERCISE PROGRAM:   Increase participation in social activities such as church   GOALS:  Goals reviewed with patient? Yes  SHORT TERM GOALS: Target date: 10 sessions  With maximal cues, pt will demonstrate safe swallow strategies when consuming pills in 5 weeks.  Baseline: Goal status: INITIAL  2.  With moderate cues, pt will complete a basic multi-step task (e.g., making a sandwich)  with superivision within 5 weeks.  Baseline:  Goal status: INITIAL  3.  With moderate cues, pt will improve sustained attention to basic tasks for 10 minutes.  Baseline:  Goal status: INITIAL  4.  With maximal cues, pt will sustain phonation for 5-7 seconds at 65 dB in 8 out of 10 opportunities across 4 sessions.  Baseline:  Goal status: INITIAL  5. With maximal cues, pt will initiate commenting on a basic topic 1 times during a structured activity in 5 out of 7 opportunities.  Baseline:   Goal Status: INITIAL   LONG TERM GOALS: Target date: 09/24/2024  With maximal cues, pt will initiate asking a question or basic topic 1 times during a structured activity in 5 out of 7 opportunities. Baseline:  Goal status: INITIAL  2.  With maximal cues, pt will produce one word utterances at 65 dB in 8 out of 10 opportunities across 5 sessions.  Baseline:  Goal status: INITIAL  3.  With moderate cues, pt will sustain attention to basic task for 15 minutes in 3 out of 4 sessions.  Baseline:  Goal status: INITIAL  ASSESSMENT:  CLINICAL IMPRESSION: Patient is a 73 y.o. female who was seen today for a cognitive communication treatment d/t acute left basal ganglia stroke.Pt presents with a significant cognitive communication impairment (see above objective information) that is further complicated by severe hypokinetic dysarthria. Her voice is so quiet/soft that she is almost  inaudible, when audible, her speech is monotone with suspected difficulty in initiating speech (delay in starting to speak) - her husband reports decreased initiation of spontaneous speech. She appears to have emotional blunting, flat affect, no eye contact (frequently looking down at her hands). As a result, pt is dependent on others for assistance with ADLs and iADLs.    OBJECTIVE IMPAIRMENTS include attention, memory, awareness, executive functioning, expressive language, dysarthria, and dysphagia. These impairments are  limiting patient from managing medications, managing appointments, managing finances, household responsibilities, ADLs/IADLs, effectively communicating at home and in community, and safety when swallowing. Factors affecting potential to achieve goals and functional outcome are ability to learn/carryover information, co-morbidities, medical prognosis, previous level of function, severity of impairments, and family/community support. Patient will benefit from skilled SLP services to address above impairments and improve overall function.  REHAB POTENTIAL: Good  PLAN: SLP FREQUENCY: 1-2x/week  SLP DURATION: 12 weeks  PLANNED INTERVENTIONS: Aspiration precaution training, Diet toleration management , Language facilitation, Environmental controls, Trials of upgraded texture/liquids, Cueing hierachy, Cognitive reorganization, Internal/external aids, Functional tasks, Multimodal communication approach, SLP instruction and feedback, Compensatory strategies, and Patient/family education   Jim Lundin B. Rubbie, M.S., CCC-SLP, CBIS Speech-Language Pathologist Certified Brain Injury Specialist [facility]  [facility] [phone] Ascom [phone] Fax [phone]  "

## 2024-07-22 ENCOUNTER — Ambulatory Visit

## 2024-07-22 ENCOUNTER — Ambulatory Visit: Admitting: Speech Pathology

## 2024-07-22 DIAGNOSIS — R41841 Cognitive communication deficit: Secondary | ICD-10-CM

## 2024-07-22 NOTE — Therapy (Signed)
 " OUTPATIENT SPEECH LANGUAGE PATHOLOGY  COGNITIVE COMMUNICATION  TREATMENT NOTE   Patient Name: Abigail Malone MRN: [id number] DOB:1951-04-02, 73 y.o., female Today's Date: 07/22/2024  PCP: Toribio Hoyle, PA REFERRING PROVIDER: Norleen Punch, MD   End of Session - 07/22/24 1016     Visit Number 4    Number of Visits 25    Date for Recertification  09/24/24    Authorization Type [employer]    Progress Note Due on Visit 10    SLP Start Time 1015    SLP Stop Time  1100    SLP Time Calculation (min) 45 min    Activity Tolerance Patient tolerated treatment well          Past Medical History:  Diagnosis Date   Abnormal uterine bleeding (AUB) 05/15/2016   Allergic rhinitis, seasonal 12/09/2014   Anemia, unspecified 11/18/2013   Atypical ductal hyperplasia of right breast 09/19/2021   Cardiac syncope 05/18/2019   HTN (hypertension) 11/18/2013   Hypertension    Menopausal symptoms 08/05/2014   Right knee pain 07/26/2015   Stroke [facility])    Past Surgical History:  Procedure Laterality Date   BREAST BIOPSY Right 09/07/2021   stereo calcs, ribbon clip, path pending.   BREAST BIOPSY WITH RADIO FREQUENCY LOCALIZER Right 10/18/2021   Procedure: BREAST BIOPSY WITH RADIO FREQUENCY LOCALIZER;  Surgeon: Lane Shope, MD;  Location: [facility] ORS;  Service: General;  Laterality: Right;   BREAST EXCISIONAL BIOPSY Right 2023   COLONOSCOPY  ~2018   Patient Active Problem List   Diagnosis Date Noted   Hemiparesis affecting right side as late effect of cerebrovascular accident (CVA) (HCC) 06/29/2024   Elevated LFTs 06/29/2024   Rotator cuff impingement syndrome, right 02/25/2023   Obesity with body mass index (BMI) of 30.0 to 39.9 01/01/2020   Hyperlipidemia, mixed 05/18/2019   Mixed stress and urge urinary incontinence 04/01/2018   Right knee pain 07/26/2015   Allergic rhinitis, seasonal 12/09/2014   HTN (hypertension) 11/18/2013   Anemia, unspecified 11/18/2013     ONSET DATE: 04/10/2024: date of referral  06/23/2024  REFERRING DIAG: R47.01 (ICD-10-CM) - Aphasia   THERAPY DIAG:  Cognitive communication deficit  Rationale for Evaluation and Treatment Rehabilitation  SUBJECTIVE:   PERTINENT HISTORY and DIAGNOSTIC FINDINGS: Pt is a 73 year old female with pertinent medical history of HTN who was admitted to [facility] on 04/10/2024 for AMS, found to have a small acute infarct in the left basal ganglia and left corona radiata on MRI.   MRI Brain 04/11/2024: 1. Small acute infarct involving the left basal ganglia and corona radiata. 2. Chronic infarcts in the right basal ganglia and left occipital lobe. 3. 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 Recommend ENT follow-up if this lesion has not already been evaluated.   She was admitted to [facility] from 04/16/24 through 05/06/24, following a left MCA territory ischemic stroke.  MRI 04/21/2024 Multiple small foci of abnormal diffusion restriction in the left caudate body, corona radiata, and superior left lentiform nucleus with corresponding hyperintense T2/FLAIR signal. Chronic infarcts in the right basal ganglia and left occipital lobe. Hyperintense T2/FLAIR signal in the deep and subcortical white matter reflects sequelae of senescent small vessel ischemic disease.Diffuse parenchymal volume loss with corresponding prominence of ventricles and subarachnoid spaces. No abnormal intracranial enhancement identified.In the left parotid space, contiguous with the retromandibular vein and at least partially involving the deep left parotid lobe,  there is an enhancing and T2 hyperintense lesion which measures up to 2.4 x 1.7 cm in maximal axial dimensions. This lesion demonstrates diffusion restriction.  Pt received HHST services for post-stroke sequelae include mildright hemiparesis, memory loss, cognitive-communication impairment,  and reduced functional independence with mobility and ADLs.  PAIN:  Are you having pain? No   FALLS: Has patient fallen in last 6 months?  No  LIVING ENVIRONMENT: Lives with: lives with their spouse Lives in: House/apartment  PLOF:  Level of assistance: Independent with ADLs, Independent with IADLs Employment: Retired   PATIENT GOALS   to unaware to state d/t decreased awareness of deficits, pt's husband states he would like for pt to improve her speech, memory and ability to participate in ADLs and iADLs  SUBJECTIVE STATEMENT: Pt quiet but increased participation and initiation during today's session Pt accompanied by: alone, her husband stayed in the waiting area   OBJECTIVE:   TODAY'S TREATMENT:  Skilled treatment session targeted pt's cognitive communication goals. SLP facilitated session by providing the following interventions:  Pt able to find her way to this writer's office independently, found way to lobby after session independently  Pt's speech intelligibility continues to be greatly reduced d/t severely reduced vocal intensity - barely audible when recorded for audio-feedback - while pt is aware that her husband asks her to repeat herself all the time she doesn't appear aware of reduced ability - I think I am loud enough  Audio-feedback was utilized in an attempt to build awareness - Pt able to state that she wasn't loud enough to be understood but she was not able to increase loudness - maximal multi-modal cues utilized including barrier tasks, rating scales - nothing effective at this time  Pt with improved physical task initiation but continued extended verbal response times.   Pt able to recall rules from previously taught basic problem solving game. Able to play with moderate verbal cues (suspect d/t fatigue - pt falling asleep at end of session)    PATIENT EDUCATION: Education details: results of this assessment, ST POC Person educated: Patient and  Spouse Education method: Explanation Education comprehension: needs further education   HOME EXERCISE PROGRAM:   Increase participation in social activities such as church   GOALS:  Goals reviewed with patient? Yes  SHORT TERM GOALS: Target date: 10 sessions  With maximal cues, pt will demonstrate safe swallow strategies when consuming pills in 5 weeks.  Baseline: Goal status: INITIAL  2.  With moderate cues, pt will complete a basic multi-step task (e.g., making a sandwich) with superivision within 5 weeks.  Baseline:  Goal status: INITIAL  3.  With moderate cues, pt will improve sustained attention to basic tasks for 10 minutes.  Baseline:  Goal status: INITIAL  4.  With maximal cues, pt will sustain phonation for 5-7 seconds at 65 dB in 8 out of 10 opportunities across 4 sessions.  Baseline:  Goal status: INITIAL  5. With maximal cues, pt will initiate commenting on a basic topic 1 times during a structured activity in 5 out of 7 opportunities.  Baseline:   Goal Status: INITIAL   LONG TERM GOALS: Target date: 09/24/2024  With maximal cues, pt will initiate asking a question or basic topic 1 times during a structured activity in 5 out of 7 opportunities. Baseline:  Goal status: INITIAL  2.  With maximal cues, pt will produce one word utterances at 65 dB in 8 out of 10 opportunities across 5 sessions.  Baseline:  Goal status: INITIAL  3.  With moderate cues, pt will sustain attention to basic task for 15 minutes in 3 out of 4 sessions.  Baseline:  Goal status: INITIAL  ASSESSMENT:  CLINICAL IMPRESSION: Patient is a 73 y.o. female who was seen today for a cognitive communication treatment d/t acute left basal ganglia stroke.Pt presents with a significant cognitive communication impairment (see above objective information) that is further complicated by severe hypokinetic dysarthria. Her voice is so quiet/soft that she is almost inaudible, when audible, her speech  is monotone with suspected difficulty in initiating speech (delay in starting to speak) - her husband reports decreased initiation of spontaneous speech. She appears to have emotional blunting, flat affect, no eye contact (frequently looking down at her hands). As a result, pt is dependent on others for assistance with ADLs and iADLs.   See the above treatment note for details.    OBJECTIVE IMPAIRMENTS include attention, memory, awareness, executive functioning, expressive language, dysarthria, and dysphagia. These impairments are limiting patient from managing medications, managing appointments, managing finances, household responsibilities, ADLs/IADLs, effectively communicating at home and in community, and safety when swallowing. Factors affecting potential to achieve goals and functional outcome are ability to learn/carryover information, co-morbidities, medical prognosis, previous level of function, severity of impairments, and family/community support. Patient will benefit from skilled SLP services to address above impairments and improve overall function.  REHAB POTENTIAL: Good  PLAN: SLP FREQUENCY: 1-2x/week  SLP DURATION: 12 weeks  PLANNED INTERVENTIONS: Aspiration precaution training, Diet toleration management , Language facilitation, Environmental controls, Trials of upgraded texture/liquids, Cueing hierachy, Cognitive reorganization, Internal/external aids, Functional tasks, Multimodal communication approach, SLP instruction and feedback, Compensatory strategies, and Patient/family education   Silvia Markuson B. Rubbie, M.S., CCC-SLP, CBIS Speech-Language Pathologist Certified Brain Injury Specialist [facility]  [facility] [phone] Ascom [phone] Fax [phone]  "

## 2024-07-28 ENCOUNTER — Ambulatory Visit (INDEPENDENT_AMBULATORY_CARE_PROVIDER_SITE_OTHER): Admitting: Physician Assistant

## 2024-07-28 ENCOUNTER — Ambulatory Visit

## 2024-07-28 ENCOUNTER — Ambulatory Visit: Attending: *Deleted | Admitting: Speech Pathology

## 2024-07-28 ENCOUNTER — Ambulatory Visit: Admitting: Occupational Therapy

## 2024-07-28 ENCOUNTER — Encounter: Payer: Self-pay | Admitting: Physician Assistant

## 2024-07-28 VITALS — BP 112/50 | HR 75 | Temp 98.2°F | Ht 59.0 in | Wt 169.0 lb

## 2024-07-28 DIAGNOSIS — R41841 Cognitive communication deficit: Secondary | ICD-10-CM | POA: Insufficient documentation

## 2024-07-28 DIAGNOSIS — R1310 Dysphagia, unspecified: Secondary | ICD-10-CM | POA: Insufficient documentation

## 2024-07-28 DIAGNOSIS — I6523 Occlusion and stenosis of bilateral carotid arteries: Secondary | ICD-10-CM | POA: Insufficient documentation

## 2024-07-28 DIAGNOSIS — E782 Mixed hyperlipidemia: Secondary | ICD-10-CM | POA: Insufficient documentation

## 2024-07-28 DIAGNOSIS — R278 Other lack of coordination: Secondary | ICD-10-CM | POA: Insufficient documentation

## 2024-07-28 DIAGNOSIS — Z79899 Other long term (current) drug therapy: Secondary | ICD-10-CM | POA: Insufficient documentation

## 2024-07-28 DIAGNOSIS — M6281 Muscle weakness (generalized): Secondary | ICD-10-CM

## 2024-07-28 DIAGNOSIS — I340 Nonrheumatic mitral (valve) insufficiency: Secondary | ICD-10-CM | POA: Insufficient documentation

## 2024-07-28 DIAGNOSIS — I10 Essential (primary) hypertension: Secondary | ICD-10-CM

## 2024-07-28 DIAGNOSIS — R471 Dysarthria and anarthria: Secondary | ICD-10-CM | POA: Insufficient documentation

## 2024-07-28 DIAGNOSIS — K118 Other diseases of salivary glands: Secondary | ICD-10-CM | POA: Insufficient documentation

## 2024-07-28 DIAGNOSIS — Z6835 Body mass index (BMI) 35.0-35.9, adult: Secondary | ICD-10-CM | POA: Insufficient documentation

## 2024-07-28 DIAGNOSIS — I671 Cerebral aneurysm, nonruptured: Secondary | ICD-10-CM | POA: Insufficient documentation

## 2024-07-28 DIAGNOSIS — I672 Cerebral atherosclerosis: Secondary | ICD-10-CM | POA: Insufficient documentation

## 2024-07-28 DIAGNOSIS — Z87891 Personal history of nicotine dependence: Secondary | ICD-10-CM | POA: Insufficient documentation

## 2024-07-28 DIAGNOSIS — Z7982 Long term (current) use of aspirin: Secondary | ICD-10-CM | POA: Insufficient documentation

## 2024-07-28 DIAGNOSIS — Z23 Encounter for immunization: Secondary | ICD-10-CM | POA: Diagnosis not present

## 2024-07-28 DIAGNOSIS — N3946 Mixed incontinence: Secondary | ICD-10-CM | POA: Insufficient documentation

## 2024-07-28 DIAGNOSIS — E66812 Obesity, class 2: Secondary | ICD-10-CM | POA: Insufficient documentation

## 2024-07-28 DIAGNOSIS — Z8673 Personal history of transient ischemic attack (TIA), and cerebral infarction without residual deficits: Secondary | ICD-10-CM | POA: Insufficient documentation

## 2024-07-28 DIAGNOSIS — R2990 Unspecified symptoms and signs involving the nervous system: Secondary | ICD-10-CM | POA: Insufficient documentation

## 2024-07-28 NOTE — Progress Notes (Signed)
 "   Date:  07/28/2024   Name:  Abigail Malone   DOB:  1951-03-15   MRN:  [id number]   Chief Complaint: Hypertension  HPI  [name] returns with husband [name] for f/u on HTN after establishing care with me last visit. At that visit I had her stop hydrochlorothiazide  and start spironolactone  due to hypokalemia. Home BP readings using an Omron arm cuff seem labile but generally 110-140 systolic, and [name] brought the device today. He tells me she kept taking off her clonidine  patch because it was itchy. She also has not been taking her amantadine for about 3-4 weeks. She says she has been feeling loopy a few mornings out of the week.   Medication list has been reviewed and updated.  Active Medications[1]   Review of Systems  Patient Active Problem List   Diagnosis Date Noted   Hemiparesis affecting right side as late effect of cerebrovascular accident (CVA) (HCC) 06/29/2024   Elevated LFTs 06/29/2024   Rotator cuff impingement syndrome, right 02/25/2023   Obesity with body mass index (BMI) of 30.0 to 39.9 01/01/2020   Hyperlipidemia, mixed 05/18/2019   Mixed stress and urge urinary incontinence 04/01/2018   Right knee pain 07/26/2015   Allergic rhinitis, seasonal 12/09/2014   HTN (hypertension) 11/18/2013   Anemia, unspecified 11/18/2013    Allergies[2]  Immunization History  Administered Date(s) Administered   Fluad Quad(high Dose 65+) 04/03/2021, 04/03/2023   Hepatitis B 07/09/2000, 08/15/2000   Influenza, Seasonal, Injecte, Preservative Fre 07/28/2024   Influenza-Unspecified 03/21/2022   Moderna Sars-Covid-2 Vaccination 09/04/2019, 10/05/2019, 06/17/2020, 03/21/2022   PNEUMOCOCCAL CONJUGATE-20 06/12/2022   Pneumococcal Conjugate-13 03/07/2017   Pneumococcal Polysaccharide-23 06/07/2015    Past Surgical History:  Procedure Laterality Date   BREAST BIOPSY Right 09/07/2021   stereo calcs, ribbon clip, path pending.   BREAST BIOPSY WITH RADIO FREQUENCY LOCALIZER Right  10/18/2021   Procedure: BREAST BIOPSY WITH RADIO FREQUENCY LOCALIZER;  Surgeon: Lane Shope, MD;  Location: [facility] ORS;  Service: General;  Laterality: Right;   BREAST EXCISIONAL BIOPSY Right 2023   COLONOSCOPY  ~2018    Social History[3]  Family History  Problem Relation Age of Onset   Cancer Mother        Liver   Stroke Mother    Hypertension Mother    Hematuria Mother    Liver cancer Mother    Cancer Father    Prostate cancer Father    Kidney failure Brother    Breast cancer Neg Hx         07/28/2024    8:35 AM 06/29/2024    2:46 PM 12/02/2023    1:26 PM 06/13/2023    3:58 PM  GAD 7 : Generalized Anxiety Score  Nervous, Anxious, on Edge 0 0 0 0  Control/stop worrying 0 0 0 0  Worry too much - different things 0 0 0 0  Trouble relaxing 0 0 0 0  Restless 0 0 0 0  Easily annoyed or irritable 0 0 0 0  Afraid - awful might happen 0 0 0 0  Total GAD 7 Score 0 0 0 0  Anxiety Difficulty Not difficult at all Not difficult at all Not difficult at all Not difficult at all       07/28/2024    8:35 AM 06/29/2024    2:46 PM 12/02/2023    1:26 PM  Depression screen PHQ 2/9  Decreased Interest 0 0 0  Down, Depressed, Hopeless 0 0 0  PHQ - 2 Score  0 0 0  Altered sleeping   0  Tired, decreased energy   0  Change in appetite   0  Feeling bad or failure about yourself    0  Trouble concentrating   0  Moving slowly or fidgety/restless   0  Suicidal thoughts   0  PHQ-9 Score   0   Difficult doing work/chores   Not difficult at all     Data saved with a previous flowsheet row definition    BP Readings from Last 3 Encounters:  07/28/24 (!) 112/50  06/29/24 (!) 126/56  12/02/23 138/82    Wt Readings from Last 3 Encounters:  07/28/24 169 lb (76.7 kg)  06/29/24 169 lb (76.7 kg)  12/02/23 179 lb 3.2 oz (81.3 kg)    BP (!) 112/50 (BP Location: Left Arm, Cuff Size: Large)   Pulse 75   Temp 98.2 F (36.8 C)   Ht 4' 11 (1.499 m)   Wt 169 lb (76.7 kg)   SpO2 98%   BMI  34.13 kg/m   Physical Exam Vitals and nursing note reviewed.  Constitutional:      Appearance: Normal appearance.  Cardiovascular:     Rate and Rhythm: Normal rate.  Pulmonary:     Effort: Pulmonary effort is normal.  Abdominal:     General: There is no distension.  Musculoskeletal:        General: Normal range of motion.  Skin:    General: Skin is warm and dry.  Neurological:     Mental Status: She is alert and oriented to person, place, and time.     Gait: Gait is intact.  Psychiatric:        Mood and Affect: Mood and affect normal.     Recent Labs     Component Value Date/Time   NA 140 06/29/2024 1549   K 3.1 (L) 06/29/2024 1549   CL 99 06/29/2024 1549   CO2 26 06/29/2024 1549   GLUCOSE 134 (H) 06/29/2024 1549   GLUCOSE 79 10/12/2021 1001   BUN 10 06/29/2024 1549   CREATININE 0.80 06/29/2024 1549   CALCIUM 9.6 06/29/2024 1549   PROT 7.1 06/29/2024 1549   ALBUMIN 4.2 06/29/2024 1549   AST 18 06/29/2024 1549   ALT 21 06/29/2024 1549   ALKPHOS 148 (H) 06/29/2024 1549   BILITOT 0.6 06/29/2024 1549   GFRNONAA >60 10/12/2021 1001    Lab Results  Component Value Date   WBC 7.6 10/12/2021   HGB 11.3 (L) 10/12/2021   HCT 34.9 (L) 10/12/2021   MCV 80.4 10/12/2021   PLT 257 10/12/2021   No results found for: HGBA1C Lab Results  Component Value Date   CHOL 112 06/29/2024   HDL 52 06/29/2024   LDLCALC 45 06/29/2024   TRIG 75 06/29/2024   CHOLHDL 2.2 06/29/2024   No results found for: TSH    Assessment and Plan:  Primary hypertension Assessment & Plan: BP seems labile but reasonably controlled. Will take clonidine  off the list as she has not taken it in weeks. Continue other meds as prescribed, recheck BMP today. F/u again in about 4 wk to check in.  Orders: -     Basic metabolic panel with GFR  Encounter for immunization -     Flu vaccine trivalent PF, 6mos and older(Flulaval,Afluria,Fluarix,Fluzone)     F/u 4wk OV HTN   Rolan Hoyle, PA-C,  DMSc, DipACLM, Nutritionist [facility] [phone]  [phone]      [1]  Current Meds  Medication Sig   acetaminophen  (TYLENOL ) 500 MG tablet Take 650 mg by mouth every 6 (six) hours as needed for moderate pain (pain score 4-6).   amantadine (SYMMETREL) 50 MG/5ML solution Take 50 mg by mouth 2 (two) times daily.   aspirin 81 MG chewable tablet Chew by mouth daily.   atorvastatin (LIPITOR) 80 MG tablet Take 80 mg by mouth.   carvedilol  (COREG ) 12.5 MG tablet Take 12.5 mg by mouth 2 (two) times daily.   NIFEdipine (PROCARDIA XL/NIFEDICAL XL) 60 MG 24 hr tablet Take 60 mg by mouth.   spironolactone  (ALDACTONE ) 25 MG tablet Take 1 tablet (25 mg total) by mouth daily.   valsartan  (DIOVAN ) 160 MG tablet Take 1 tablet (160 mg total) by mouth daily.   [DISCONTINUED] cloNIDine  (CATAPRES  - DOSED IN MG/24 HR) 0.1 mg/24hr patch Place 1 patch (0.1 mg total) onto the skin once a week.  [2]  Allergies Allergen Reactions   Amlodipine     Leg cramping  [3]  Social History Tobacco Use   Smoking status: Former    Current packs/day: 0.00    Average packs/day: 0.5 packs/day for 8.0 years (4.0 ttl pk-yrs)    Types: Cigarettes    Start date: 12/22/1998    Quit date: 12/22/2006    Years since quitting: 17.6    Passive exposure: Never   Smokeless tobacco: Never  Vaping Use   Vaping status: Never Used  Substance Use Topics   Alcohol use: Not Currently   Drug use: No   "

## 2024-07-28 NOTE — Therapy (Signed)
 " OUTPATIENT SPEECH LANGUAGE PATHOLOGY  COGNITIVE COMMUNICATION  TREATMENT NOTE   Patient Name: Abigail Malone MRN: [id number] DOB:10-26-1950, 74 y.o., female Today's Date: 07/28/2024  PCP: Toribio Hoyle, PA REFERRING PROVIDER: Norleen Punch, MD   End of Session - 07/28/24 1540     Visit Number 5    Number of Visits 25    Date for Recertification  09/24/24    Authorization Type [employer]    Progress Note Due on Visit 10    SLP Start Time 1530    SLP Stop Time  1615    SLP Time Calculation (min) 45 min    Activity Tolerance Patient tolerated treatment well          Past Medical History:  Diagnosis Date   Abnormal uterine bleeding (AUB) 05/15/2016   Allergic rhinitis, seasonal 12/09/2014   Anemia, unspecified 11/18/2013   Atypical ductal hyperplasia of right breast 09/19/2021   Cardiac syncope 05/18/2019   HTN (hypertension) 11/18/2013   Hypertension    Menopausal symptoms 08/05/2014   Right knee pain 07/26/2015   Stroke [facility])    Past Surgical History:  Procedure Laterality Date   BREAST BIOPSY Right 09/07/2021   stereo calcs, ribbon clip, path pending.   BREAST BIOPSY WITH RADIO FREQUENCY LOCALIZER Right 10/18/2021   Procedure: BREAST BIOPSY WITH RADIO FREQUENCY LOCALIZER;  Surgeon: Lane Shope, MD;  Location: [facility] ORS;  Service: General;  Laterality: Right;   BREAST EXCISIONAL BIOPSY Right 2023   COLONOSCOPY  ~2018   Patient Active Problem List   Diagnosis Date Noted   Hemiparesis affecting right side as late effect of cerebrovascular accident (CVA) (HCC) 06/29/2024   Elevated LFTs 06/29/2024   Rotator cuff impingement syndrome, right 02/25/2023   Obesity with body mass index (BMI) of 30.0 to 39.9 01/01/2020   Hyperlipidemia, mixed 05/18/2019   Mixed stress and urge urinary incontinence 04/01/2018   Right knee pain 07/26/2015   Allergic rhinitis, seasonal 12/09/2014   HTN (hypertension) 11/18/2013   Anemia, unspecified 11/18/2013     ONSET DATE: 04/10/2024: date of referral  06/23/2024  REFERRING DIAG: R47.01 (ICD-10-CM) - Aphasia   THERAPY DIAG:  Cognitive communication deficit  Dysarthria and anarthria  Rationale for Evaluation and Treatment Rehabilitation  SUBJECTIVE:   PERTINENT HISTORY and DIAGNOSTIC FINDINGS: Pt is a 73 year old female with pertinent medical history of HTN who was admitted to [facility] on 04/10/2024 for AMS, found to have a small acute infarct in the left basal ganglia and left corona radiata on MRI.   MRI Brain 04/11/2024: 1. Small acute infarct involving the left basal ganglia and corona radiata. 2. Chronic infarcts in the right basal ganglia and left occipital lobe. 3. 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 Recommend ENT follow-up if this lesion has not already been evaluated.   She was admitted to [facility] from 04/16/24 through 05/06/24, following a left MCA territory ischemic stroke.  MRI 04/21/2024 Multiple small foci of abnormal diffusion restriction in the left caudate body, corona radiata, and superior left lentiform nucleus with corresponding hyperintense T2/FLAIR signal. Chronic infarcts in the right basal ganglia and left occipital lobe. Hyperintense T2/FLAIR signal in the deep and subcortical white matter reflects sequelae of senescent small vessel ischemic disease.Diffuse parenchymal volume loss with corresponding prominence of ventricles and subarachnoid spaces. No abnormal intracranial enhancement identified.In the left parotid space, contiguous with the retromandibular vein and at least partially involving the  deep left parotid lobe, there is an enhancing and T2 hyperintense lesion which measures up to 2.4 x 1.7 cm in maximal axial dimensions. This lesion demonstrates diffusion restriction.  Pt received HHST services for post-stroke sequelae include mildright hemiparesis, memory loss,  cognitive-communication impairment, and reduced functional independence with mobility and ADLs.  PAIN:  Are you having pain? No   FALLS: Has patient fallen in last 6 months?  No  LIVING ENVIRONMENT: Lives with: lives with their spouse Lives in: House/apartment  PLOF:  Level of assistance: Independent with ADLs, Independent with IADLs Employment: Retired   PATIENT GOALS   to unaware to state d/t decreased awareness of deficits, pt's husband states he would like for pt to improve her speech, memory and ability to participate in ADLs and iADLs  SUBJECTIVE STATEMENT: Pt quiet but increased participation and initiation during today's session Pt accompanied by: alone, her husband stayed in the waiting area   OBJECTIVE:   TODAY'S TREATMENT:  Skilled treatment session targeted pt's cognitive communication goals. SLP facilitated session by providing the following interventions:  Pt with slightly increased vocal intensity during today's session.   She stated her main goal post stroke was to improve her handwriting. SLP had pt engage in writing down answers to biographical questions as well as recall of information. Pt's handwritten responses were legible to this writer with accuracy of information 90%.    PATIENT EDUCATION: Education details: results of this assessment, ST POC Person educated: Patient and Spouse Education method: Explanation Education comprehension: needs further education   HOME EXERCISE PROGRAM:   Increase participation in social activities such as church   GOALS:  Goals reviewed with patient? Yes  SHORT TERM GOALS: Target date: 10 sessions  With maximal cues, pt will demonstrate safe swallow strategies when consuming pills in 5 weeks.  Baseline: Goal status: INITIAL  2.  With moderate cues, pt will complete a basic multi-step task (e.g., making a sandwich) with superivision within 5 weeks.  Baseline:  Goal status: INITIAL  3.  With moderate cues, pt  will improve sustained attention to basic tasks for 10 minutes.  Baseline:  Goal status: INITIAL  4.  With maximal cues, pt will sustain phonation for 5-7 seconds at 65 dB in 8 out of 10 opportunities across 4 sessions.  Baseline:  Goal status: INITIAL  5. With maximal cues, pt will initiate commenting on a basic topic 1 times during a structured activity in 5 out of 7 opportunities.  Baseline:   Goal Status: INITIAL   LONG TERM GOALS: Target date: 09/24/2024  With maximal cues, pt will initiate asking a question or basic topic 1 times during a structured activity in 5 out of 7 opportunities. Baseline:  Goal status: INITIAL  2.  With maximal cues, pt will produce one word utterances at 65 dB in 8 out of 10 opportunities across 5 sessions.  Baseline:  Goal status: INITIAL  3.  With moderate cues, pt will sustain attention to basic task for 15 minutes in 3 out of 4 sessions.  Baseline:  Goal status: INITIAL  ASSESSMENT:  CLINICAL IMPRESSION: Patient is a 74 y.o. female who was seen today for a cognitive communication treatment d/t acute left basal ganglia stroke.Pt presents with a significant cognitive communication impairment (see above objective information) that is further complicated by severe hypokinetic dysarthria. Her voice is so quiet/soft that she is almost inaudible, when audible, her speech is monotone with suspected difficulty in initiating speech (delay in starting to speak) -  her husband reports decreased initiation of spontaneous speech. She appears to have emotional blunting, flat affect, no eye contact (frequently looking down at her hands). As a result, pt is dependent on others for assistance with ADLs and iADLs.   See the above treatment note for details.    OBJECTIVE IMPAIRMENTS include attention, memory, awareness, executive functioning, expressive language, dysarthria, and dysphagia. These impairments are limiting patient from managing medications, managing  appointments, managing finances, household responsibilities, ADLs/IADLs, effectively communicating at home and in community, and safety when swallowing. Factors affecting potential to achieve goals and functional outcome are ability to learn/carryover information, co-morbidities, medical prognosis, previous level of function, severity of impairments, and family/community support. Patient will benefit from skilled SLP services to address above impairments and improve overall function.  REHAB POTENTIAL: Good  PLAN: SLP FREQUENCY: 1-2x/week  SLP DURATION: 12 weeks  PLANNED INTERVENTIONS: Aspiration precaution training, Diet toleration management , Language facilitation, Environmental controls, Trials of upgraded texture/liquids, Cueing hierachy, Cognitive reorganization, Internal/external aids, Functional tasks, Multimodal communication approach, SLP instruction and feedback, Compensatory strategies, and Patient/family education   Dekisha Mesmer B. Rubbie, M.S., CCC-SLP, CBIS Speech-Language Pathologist Certified Brain Injury Specialist [facility]  [facility] [phone] Ascom [phone] Fax [phone]  "

## 2024-07-28 NOTE — Patient Instructions (Signed)
 Please stop clonidine  patches.  Resume amantadine for memory.  No other medication changes today. Continue to watch blood pressure.  We are checking potassium and kidney function today.  Come back Feb 3 to see me and recheck blood pressure.   Rolan Hoyle, PA-C, DMSc, DipACLM, Nutritionist [facility] [phone]

## 2024-07-28 NOTE — Assessment & Plan Note (Signed)
 BP seems labile but reasonably controlled. Will take clonidine  off the list as she has not taken it in weeks. Continue other meds as prescribed, recheck BMP today. F/u again in about 4 wk to check in.

## 2024-07-29 ENCOUNTER — Other Ambulatory Visit: Payer: Self-pay

## 2024-07-29 ENCOUNTER — Ambulatory Visit: Payer: Self-pay | Admitting: Physician Assistant

## 2024-07-29 DIAGNOSIS — Z1231 Encounter for screening mammogram for malignant neoplasm of breast: Secondary | ICD-10-CM

## 2024-07-29 LAB — BASIC METABOLIC PANEL WITH GFR
BUN/Creatinine Ratio: 16 (ref 12–28)
BUN: 12 mg/dL (ref 8–27)
CO2: 21 mmol/L (ref 20–29)
Calcium: 9.8 mg/dL (ref 8.7–10.3)
Chloride: 106 mmol/L (ref 96–106)
Creatinine, Ser: 0.73 mg/dL (ref 0.57–1.00)
Glucose: 103 mg/dL — ABNORMAL HIGH (ref 70–99)
Potassium: 4.3 mmol/L (ref 3.5–5.2)
Sodium: 141 mmol/L (ref 134–144)
eGFR: 87 mL/min/1.73

## 2024-07-30 ENCOUNTER — Ambulatory Visit

## 2024-07-30 ENCOUNTER — Ambulatory Visit: Admitting: Speech Pathology

## 2024-07-31 ENCOUNTER — Ambulatory Visit: Admitting: Speech Pathology

## 2024-07-31 ENCOUNTER — Ambulatory Visit

## 2024-07-31 DIAGNOSIS — M6281 Muscle weakness (generalized): Secondary | ICD-10-CM

## 2024-07-31 DIAGNOSIS — R41841 Cognitive communication deficit: Secondary | ICD-10-CM

## 2024-07-31 DIAGNOSIS — R278 Other lack of coordination: Secondary | ICD-10-CM

## 2024-07-31 NOTE — Therapy (Signed)
 " OUTPATIENT SPEECH LANGUAGE PATHOLOGY  COGNITIVE COMMUNICATION  TREATMENT NOTE   Patient Name: Abigail Malone MRN: [id number] DOB:30-Aug-1950, 74 y.o., female Today's Date: 07/31/2024  PCP: Toribio Hoyle, PA REFERRING PROVIDER: Norleen Punch, MD   End of Session - 07/31/24 [phone]     Visit Number 6    Number of Visits 25    Date for Recertification  09/24/24    Authorization Type [employer]    Progress Note Due on Visit 10    SLP Start Time 0930    SLP Stop Time  1015    SLP Time Calculation (min) 45 min    Activity Tolerance Patient tolerated treatment well          Past Medical History:  Diagnosis Date   Abnormal uterine bleeding (AUB) 05/15/2016   Allergic rhinitis, seasonal 12/09/2014   Anemia, unspecified 11/18/2013   Atypical ductal hyperplasia of right breast 09/19/2021   Cardiac syncope 05/18/2019   HTN (hypertension) 11/18/2013   Hypertension    Menopausal symptoms 08/05/2014   Right knee pain 07/26/2015   Stroke [facility])    Past Surgical History:  Procedure Laterality Date   BREAST BIOPSY Right 09/07/2021   stereo calcs, ribbon clip, path pending.   BREAST BIOPSY WITH RADIO FREQUENCY LOCALIZER Right 10/18/2021   Procedure: BREAST BIOPSY WITH RADIO FREQUENCY LOCALIZER;  Surgeon: Lane Shope, MD;  Location: [facility] ORS;  Service: General;  Laterality: Right;   BREAST EXCISIONAL BIOPSY Right 2023   COLONOSCOPY  ~2018   Patient Active Problem List   Diagnosis Date Noted   Hemiparesis affecting right side as late effect of cerebrovascular accident (CVA) (HCC) 06/29/2024   Elevated LFTs 06/29/2024   Rotator cuff impingement syndrome, right 02/25/2023   Obesity with body mass index (BMI) of 30.0 to 39.9 01/01/2020   Hyperlipidemia, mixed 05/18/2019   Mixed stress and urge urinary incontinence 04/01/2018   Right knee pain 07/26/2015   Allergic rhinitis, seasonal 12/09/2014   HTN (hypertension) 11/18/2013   Anemia, unspecified 11/18/2013     ONSET DATE: 04/10/2024: date of referral  06/23/2024  REFERRING DIAG: R47.01 (ICD-10-CM) - Aphasia   THERAPY DIAG:  Cognitive communication deficit  Rationale for Evaluation and Treatment Rehabilitation  SUBJECTIVE:   PERTINENT HISTORY and DIAGNOSTIC FINDINGS: Pt is a 74 year old female with pertinent medical history of HTN who was admitted to [facility] on 04/10/2024 for AMS, found to have a small acute infarct in the left basal ganglia and left corona radiata on MRI.   MRI Brain 04/11/2024: 1. Small acute infarct involving the left basal ganglia and corona radiata. 2. Chronic infarcts in the right basal ganglia and left occipital lobe. 3. 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 Recommend ENT follow-up if this lesion has not already been evaluated.   She was admitted to [facility] from 04/16/24 through 05/06/24, following a left MCA territory ischemic stroke.  MRI 04/21/2024 Multiple small foci of abnormal diffusion restriction in the left caudate body, corona radiata, and superior left lentiform nucleus with corresponding hyperintense T2/FLAIR signal. Chronic infarcts in the right basal ganglia and left occipital lobe. Hyperintense T2/FLAIR signal in the deep and subcortical white matter reflects sequelae of senescent small vessel ischemic disease.Diffuse parenchymal volume loss with corresponding prominence of ventricles and subarachnoid spaces. No abnormal intracranial enhancement identified.In the left parotid space, contiguous with the retromandibular vein and at least partially involving the deep left parotid lobe,  there is an enhancing and T2 hyperintense lesion which measures up to 2.4 x 1.7 cm in maximal axial dimensions. This lesion demonstrates diffusion restriction.  Pt received HHST services for post-stroke sequelae include mildright hemiparesis, memory loss, cognitive-communication impairment,  and reduced functional independence with mobility and ADLs.  PAIN:  Are you having pain? No   FALLS: Has patient fallen in last 6 months?  No  LIVING ENVIRONMENT: Lives with: lives with their spouse Lives in: House/apartment  PLOF:  Level of assistance: Independent with ADLs, Independent with IADLs Employment: Retired   PATIENT GOALS   to unaware to state d/t decreased awareness of deficits, pt's husband states he would like for pt to improve her speech, memory and ability to participate in ADLs and iADLs  SUBJECTIVE STATEMENT: Pt quiet but increased participation and initiation during today's session Pt accompanied by: alone, her husband stayed in the waiting area   OBJECTIVE:   TODAY'S TREATMENT:  Skilled treatment session targeted pt's cognitive communication goals. SLP facilitated session by providing the following interventions:  Pt with increased vocal intensity when talking about appt this afternoon in [place]. She also states that she is doing more around the house such as clothes, dishes and describes what they have been eating for supper.   SLP facilitated louder speech production thru barrier reading task at the phrase level. Pt able to achieve > 80% at the phrase level with Min A cues.    PATIENT EDUCATION: Education details: results of this assessment, ST POC Person educated: Patient and Spouse Education method: Explanation Education comprehension: needs further education   HOME EXERCISE PROGRAM:   Increase participation in social activities such as church   GOALS:  Goals reviewed with patient? Yes  SHORT TERM GOALS: Target date: 10 sessions  With maximal cues, pt will demonstrate safe swallow strategies when consuming pills in 5 weeks.  Baseline: Goal status: INITIAL  2.  With moderate cues, pt will complete a basic multi-step task (e.g., making a sandwich) with superivision within 5 weeks.  Baseline:  Goal status: INITIAL  3.  With moderate  cues, pt will improve sustained attention to basic tasks for 10 minutes.  Baseline:  Goal status: INITIAL  4.  With maximal cues, pt will sustain phonation for 5-7 seconds at 65 dB in 8 out of 10 opportunities across 4 sessions.  Baseline:  Goal status: INITIAL  5. With maximal cues, pt will initiate commenting on a basic topic 1 times during a structured activity in 5 out of 7 opportunities.  Baseline:   Goal Status: INITIAL   LONG TERM GOALS: Target date: 09/24/2024  With maximal cues, pt will initiate asking a question or basic topic 1 times during a structured activity in 5 out of 7 opportunities. Baseline:  Goal status: INITIAL  2.  With maximal cues, pt will produce one word utterances at 65 dB in 8 out of 10 opportunities across 5 sessions.  Baseline:  Goal status: INITIAL  3.  With moderate cues, pt will sustain attention to basic task for 15 minutes in 3 out of 4 sessions.  Baseline:  Goal status: INITIAL  ASSESSMENT:  CLINICAL IMPRESSION: Patient is a 74 y.o. female who was seen today for a cognitive communication treatment d/t acute left basal ganglia stroke.Pt presents with a significant cognitive communication impairment (see above objective information) that is further complicated by severe hypokinetic dysarthria. Her voice is so quiet/soft that she is almost inaudible, when audible, her speech is monotone with suspected  difficulty in initiating speech (delay in starting to speak) - her husband reports decreased initiation of spontaneous speech. She appears to have emotional blunting, flat affect, no eye contact (frequently looking down at her hands). As a result, pt is dependent on others for assistance with ADLs and iADLs.   See the above treatment note for details.    OBJECTIVE IMPAIRMENTS include attention, memory, awareness, executive functioning, expressive language, dysarthria, and dysphagia. These impairments are limiting patient from managing medications,  managing appointments, managing finances, household responsibilities, ADLs/IADLs, effectively communicating at home and in community, and safety when swallowing. Factors affecting potential to achieve goals and functional outcome are ability to learn/carryover information, co-morbidities, medical prognosis, previous level of function, severity of impairments, and family/community support. Patient will benefit from skilled SLP services to address above impairments and improve overall function.  REHAB POTENTIAL: Good  PLAN: SLP FREQUENCY: 1-2x/week  SLP DURATION: 12 weeks  PLANNED INTERVENTIONS: Aspiration precaution training, Diet toleration management , Language facilitation, Environmental controls, Trials of upgraded texture/liquids, Cueing hierachy, Cognitive reorganization, Internal/external aids, Functional tasks, Multimodal communication approach, SLP instruction and feedback, Compensatory strategies, and Patient/family education   Lillar Bianca B. Rubbie, M.S., CCC-SLP, CBIS Speech-Language Pathologist Certified Brain Injury Specialist [facility]  [facility] [phone] Ascom [phone] Fax [phone]  "

## 2024-07-31 NOTE — Therapy (Unsigned)
 " OUTPATIENT OCCUPATIONAL THERAPY NEURO TREATMENT NOTE  Patient Name: Abigail Malone MRN: [id number] DOB:12-25-1950, 74 y.o., female Today's Date: 07/31/2024  PCP: Toribio Hoyle, PA REFERRING PROVIDER: Dr. Norleen Punch  END OF SESSION:    Past Medical History:  Diagnosis Date   Abnormal uterine bleeding (AUB) 05/15/2016   Allergic rhinitis, seasonal 12/09/2014   Anemia, unspecified 11/18/2013   Atypical ductal hyperplasia of right breast 09/19/2021   Cardiac syncope 05/18/2019   HTN (hypertension) 11/18/2013   Hypertension    Menopausal symptoms 08/05/2014   Right knee pain 07/26/2015   [facility]roke [facility])    Past Surgical History:  Procedure Laterality Date   BREAST BIOPSY Right 09/07/2021   stereo calcs, ribbon clip, path pending.   BREAST BIOPSY WITH RADIO FREQUENCY LOCALIZER Right 10/18/2021   Procedure: BREAST BIOPSY WITH RADIO FREQUENCY LOCALIZER;  Surgeon: Lane Shope, MD;  Location: [facility] ORS;  Service: General;  Laterality: Right;   BREAST EXCISIONAL BIOPSY Right 2023   COLONOSCOPY  ~2018   Patient Active Problem List   Diagnosis Date Noted   Hemiparesis affecting right side as late effect of cerebrovascular accident (CVA) (HCC) 06/29/2024   Elevated LFTs 06/29/2024   Rotator cuff impingement syndrome, right 02/25/2023   Obesity with body mass index (BMI) of 30.0 to 39.9 01/01/2020   Hyperlipidemia, mixed 05/18/2019   Mixed stress and urge urinary incontinence 04/01/2018   Right knee pain 07/26/2015   Allergic rhinitis, seasonal 12/09/2014   HTN (hypertension) 11/18/2013   Anemia, unspecified 11/18/2013    ONSET DATE: 03/2024  REFERRING DIAG: Z74.09,Z78.9 (ICD-10-CM) - Impaired mobility and ADLs   THERAPY DIAG:  No diagnosis found.  Rationale for Evaluation and Treatment: Rehabilitation  SUBJECTIVE:   SUBJECTIVE STATEMENT: Pt.'s husband  reports that he forgot about her last scheduled appointment, and reports they would have been here if the new about  it.  Pt accompanied by: self and significant other (Spouse, Abigail Malone)  PERTINENT HISTORY: Per EMR: Abigail Malone is a 74 y.o. female with history of left MCA ischemic stroke Sep 2025 with sequelae including mild right hemiparesis, memory loss, cognitive-communication impairment, and reduced functional independence with mobility and ADLs. Per [facility] note 06/16/2024, patient has been referred to PT, OT, cognitive rehab, speech therapy. Most recent visit with primary care was 06/12/2024 by [facility] clinic (Dr. Salli) where HCTZ dose was increased to 25 mg daily, nifedipine was increased to 60 mg daily, and carvedilol  was also increased to 25 mg twice daily. 3 days later she was seen in the ED for altered mental status which was felt to be possibly related to recent medication changes, as the workup was negative for acute findings.   Recent d/c from [facility] post CVA.  PRECAUTIONS: None  WEIGHT BEARING RESTRICTIONS: No  PAIN:  Are you having pain? Yes: NPRS scale: FACES 4/10 at time of eval Pain location: R shoulder injury over a year ago d/t a fall  Pain description: spouse reports sometimes pt yells out if reaching in a cabinet  Aggravating factors: reaching  Relieving factors: pain patch while in hospital, tylenol    FALLS: Has patient fallen in last 6 months? No  LIVING ENVIRONMENT: Lives with: lives with their spouse Lives in: House/apartment Stairs: 1 Has following equipment at home: built in shower stool within walk in shower, rail outside the shower, rails by both toilets, one toilet is elevated.    PLOF: Independent, retired from administrator, sports, active in community, enjoyed shopping with friends   PATIENT GOALS: Spouse hopeful for  pt to regain some indep with daily tasks  OBJECTIVE:  Note: Objective measures were completed at Evaluation unless otherwise noted.  HAND DOMINANCE: Right  ADLs: Overall ADLs: Spouse providing direct supv and assist d/t cognitive decline post CVA impacting ADL  performance  Transfers/ambulation related to ADLs: indep without assistive device  Eating: spouse reports pt is eating with a fork in the R hand ok  Grooming: limited d/t difficulty reaching with the R dominant arm d/t old shoulder injury UB Dressing:  assist to don shirt overhead (old R shoulder injury with increased pain when raising arm) LB Dressing: difficulty reaching down to feet, can not fully lift R leg to cross over L knee; able to cross L leg over R knee to untie L shoe Toileting: assist with changing brief; did not wear a brief before CVA, urinary urgency prior to CVA, but would wear a pad only, not a brief  Bathing: assist with sequencing Tub Shower transfers: SBA Equipment: see above  IADLs: Shopping: Dep Light housekeeping: max A (would need at least supv d/t cognitive impairment) Meal Prep: Supv d/t cognitive deficits, but pt is participating in meal prep Community mobility: indep without AD Medication management: spouse currently Banker: spouse manages Handwriting: 100% legible  POSTURE COMMENTS:  No Significant postural limitations  ACTIVITY TOLERANCE: Activity tolerance: WFL for community mobility and ADLs  UPPER EXTREMITY ROM:    Active ROM Right eval Left eval  Shoulder flexion 82 WNL  Shoulder abduction 60 WNL  Shoulder adduction    Shoulder extension    Shoulder internal rotation WFL WNL  Shoulder external rotation ~ 60* (R hand touches behind head with chin tuck)  WNL  Elbow flexion    Elbow extension    Wrist flexion    Wrist extension    Wrist ulnar deviation    Wrist radial deviation    Wrist pronation    Wrist supination    (Blank rows = not tested)  UPPER EXTREMITY MMT:     MMT Right Eval Hx of old R shoulder injury/impingement syndrome Left eval  Shoulder flexion 3-  5  Shoulder abduction 3-  5  Shoulder adduction    Shoulder extension    Shoulder internal rotation 4+ 4+  Shoulder external rotation 4 (elbow  at side within available range) 4+  Middle trapezius    Lower trapezius    Elbow flexion 4+ 5  Elbow extension 4+ 5  Wrist flexion 4+ 5  Wrist extension 4+ 5  Wrist ulnar deviation    Wrist radial deviation    Wrist pronation    Wrist supination    (Blank rows = not tested)  HAND FUNCTION: Grip strength: Right: 20 lbs; Left: 37 lbs, Lateral pinch: Right: 6 lbs, Left: 9 lbs, and 3 point pinch: Right: 4 lbs, Left: 6 lbs  COORDINATION: Finger Nose Finger test: intact bilaterally  9 Hole Peg test: Right: 40 sec; Left: 33 sec  SENSATION: Unable to accurately assess d/t impaired cognition  EDEMA: No visible edema  MUSCLE TONE: RUE: Within functional limits  COGNITION: Overall cognitive status: Impaired; spouse assists to answer questions accurately  VISION:  Subjective report: wears glasses at baseline  VISION ASSESSMENT: TBD Patient has difficulty with following activities due to following visual impairments: TBD  PERCEPTION: Not tested  PRAXIS: Impaired: Motor planning, very mild on the R  OBSERVATIONS:  Pt pleasant, cooperative, flat affect, requires increased time for processing and assist from spouse to respond accurately to questions.  TREATMENT DATE: 07/28/2024  Therapeutic Ex.BETHA LONE with PROM to the end range  was performed with the right shoulder. -Pt. Performed AAROM shoulder ROM at the tabletop surface.  Self Care:  -Pt. was independent donning, and doffing her sweater. Occassional assist was required initially with the sleeve when doffing the sweater.  -Pt. Was independent donning and doffing the right shoe, and sock with increased time using a foot stool.   Therapeutic Activities:   -Pink Theraputty right hand strengthening exercises were performed including: gross grip strengthening, lateral, 2pt. Pinch, and 3pt. pinch strengthening, thumb,  and manipulating putty within the tips of fingers to remove small coins. The HEP was reviewed with the Pt. for gross grip strengthening, lateral pinch, 3pt. Pinch, 2pt. Pinch strengthening, log rolling, manipulating the putty for small objects, and rolling putty into small spheres.   PATIENT EDUCATION: Education details: OT role, goals, poc, ADL strategies Person educated: Patient and Spouse Education method: Explanation and Verbal cues Education comprehension: verbalized understanding and needs further education  HOME EXERCISE PROGRAM: To be initiated in upcoming sessions  GOALS: Goals reviewed with patient? Yes  SHORT TERM GOALS: Target date: 08/13/24  Pt will perform HEP for increasing RUE strength and maintaining shoulder mobility for improving ADL performance. Baseline: Eval: Not yet initiated. Goal status: INITIAL   LONG TERM GOALS: Target date: 09/24/24  Pt will increase R grip strength by 15 lbs or more lbs in order to increase ability to hold and stabilize heavy ADL supplies in the R dominant hand. Baseline: Eval: R grip 20 lbs (L 37 lbs) Goal status: INITIAL  2.  Pt will perform bathing tasks with min vc from spouse to ensure thoroughness.  Baseline: Eval: Pt requires at least min A from spouse d/t impaired sequencing, tendency to perseverate on 1 area when washing. Goal status: INITIAL  3.  Pt will improve R hand coordination as demonstrated by completion of 9 Hole peg test in 30 sec or less for improving manipulation of small ADL supplies with R dominant hand. Baseline: Eval: R 40 sec (L 33 sec) Goal status: INITIAL  4.  Pt will improve RUE shoulder mobility to [facility] for enabling pt to independently don shirt overhead.  Baseline: Eval: Spouse assists; old shoulder injury (R shoulder flex 82, abd 60, ER ~60 with chin tuck to touch back of head Goal status: INITIAL  5.  Pt will perform LB ADLs with distant supv.   Baseline: Eval: Mod A d/t difficulty reaching to  feet  Goal status: INITIAL  ASSESSMENT:  CLINICAL IMPRESSION:  Pt. continues to tolerate right shoulder exercises, and the  table glide exercises, and require d less assist proximally at the RUE today. No reports, or facial indications of pain with the exercises. Reviewed with HEP program for hand strengthening with pink theraputty. Pt. Presents with limited initiation of conversation, however Pt. was able to respond more to questions today. Pt. continues to benefit from review of exercises to maintain R shoulder mobility/decrease pain, establish HEP, to provide exercises and activities for increasing R hand strength and coordination skills, and for ADL training in order to work towards improving indep with daily tasks for reducing burden of care on spouse and improving pt QOL.  Pt/spouse in agreement with plan.     PERFORMANCE DEFICITS: in functional skills including ADLs, IADLs, coordination, dexterity, ROM, strength, pain, flexibility, Fine motor control, Gross motor control, mobility, balance, body mechanics, endurance, decreased knowledge of precautions, decreased knowledge of use of DME, and  UE functional use, cognitive skills including attention, problem solving, safety awareness, sequencing, temperament/personality, thought, and understand, and psychosocial skills including coping strategies, environmental adaptation, habits, interpersonal interactions, and routines and behaviors.   IMPAIRMENTS: are limiting patient from ADLs, IADLs, leisure, and social participation.   CO-MORBIDITIES: has co-morbidities such as R rotator cuff impingement syndrome, HTN that affects occupational performance. Patient will benefit from skilled OT to address above impairments and improve overall function.  MODIFICATION OR ASSISTANCE TO COMPLETE EVALUATION: Min-Moderate modification of tasks or assist with assess necessary to complete an evaluation.  OT OCCUPATIONAL PROFILE AND HISTORY: Detailed assessment:  Review of records and additional review of physical, cognitive, psychosocial history related to current functional performance.  CLINICAL DECISION MAKING: Moderate - several treatment options, min-mod task modification necessary  REHAB POTENTIAL: Fair    EVALUATION COMPLEXITY: Moderate    PLAN:  OT FREQUENCY: 1-2x/week  OT DURATION: 12 weeks  PLANNED INTERVENTIONS: 97168 OT Re-evaluation, 97535 self care/ADL training, 02889 therapeutic exercise, 97530 therapeutic activity, 97112 neuromuscular re-education, and 97140 manual therapy  RECOMMENDED OTHER SERVICES: Multi-discipline evaluations today for OT/PT/SLP services  CONSULTED AND AGREED WITH PLAN OF CARE: Patient and family adult nurse (spouse)  PLAN FOR NEXT SESSION: see above  Amy T Lovett, OTR/L, CLT  07/31/2024, 10:18 AM    "

## 2024-07-31 NOTE — Therapy (Unsigned)
 " OUTPATIENT OCCUPATIONAL THERAPY NEURO TREATMENT NOTE  Patient Name: Abigail Malone MRN: [id number] DOB:Jun 14, 1951, 74 y.o., female Today's Date: 07/31/2024  PCP: Abigail Hoyle, PA REFERRING PROVIDER: Dr. Norleen Malone  END OF SESSION:  OT End of Session - 07/31/24 0730     Visit Number 4    Number of Visits 24    Date for Recertification  09/24/24    OT Start Time 1449    OT Stop Time 1530    OT Time Calculation (min) 41 min    Activity Tolerance Patient tolerated treatment well    Behavior During Therapy Flat affect          Past Medical History:  Diagnosis Date   Abnormal uterine bleeding (AUB) 05/15/2016   Allergic rhinitis, seasonal 12/09/2014   Anemia, unspecified 11/18/2013   Atypical ductal hyperplasia of right breast 09/19/2021   Cardiac syncope 05/18/2019   HTN (hypertension) 11/18/2013   Hypertension    Menopausal symptoms 08/05/2014   Right knee pain 07/26/2015   Stroke [facility])    Past Surgical History:  Procedure Laterality Date   BREAST BIOPSY Right 09/07/2021   stereo calcs, ribbon clip, path pending.   BREAST BIOPSY WITH RADIO FREQUENCY LOCALIZER Right 10/18/2021   Procedure: BREAST BIOPSY WITH RADIO FREQUENCY LOCALIZER;  Surgeon: Abigail Shope, MD;  Location: [facility] ORS;  Service: General;  Laterality: Right;   BREAST EXCISIONAL BIOPSY Right 2023   COLONOSCOPY  ~2018   Patient Active Problem List   Diagnosis Date Noted   Hemiparesis affecting right side as late effect of cerebrovascular accident (CVA) (HCC) 06/29/2024   Elevated LFTs 06/29/2024   Rotator cuff impingement syndrome, right 02/25/2023   Obesity with body mass index (BMI) of 30.0 to 39.9 01/01/2020   Hyperlipidemia, mixed 05/18/2019   Mixed stress and urge urinary incontinence 04/01/2018   Right knee pain 07/26/2015   Allergic rhinitis, seasonal 12/09/2014   HTN (hypertension) 11/18/2013   Anemia, unspecified 11/18/2013    ONSET DATE: 03/2024  REFERRING DIAG: Z74.09,Z78.9  (ICD-10-CM) - Impaired mobility and ADLs   THERAPY DIAG:  Muscle weakness (generalized)  Other lack of coordination  Rationale for Evaluation and Treatment: Rehabilitation  SUBJECTIVE:   SUBJECTIVE STATEMENT: Pt.'s husband  reports that he forgot about her last scheduled appointment, and reports they would have been here if the new about it.  Pt accompanied by: self and significant other (Spouse, Abigail Malone)  PERTINENT HISTORY: Per EMR: Abigail Malone is a 74 y.o. female with history of left MCA ischemic stroke Sep 2025 with sequelae including mild right hemiparesis, memory loss, cognitive-communication impairment, and reduced functional independence with mobility and ADLs. Per [facility] Phys [facility] note 06/16/2024, patient has been referred to PT, OT, cognitive rehab, speech therapy. Most recent visit with primary care was 06/12/2024 by [facility] clinic (Dr. Salli) where HCTZ dose was increased to 25 mg daily, nifedipine was increased to 60 mg daily, and carvedilol  was also increased to 25 mg twice daily. 3 days later she was seen in the ED for altered mental status which was felt to be possibly related to recent medication changes, as the workup was negative for acute findings.   Recent d/c from [facility] post CVA.  PRECAUTIONS: None  WEIGHT BEARING RESTRICTIONS: No  PAIN:  Are you having pain? Yes: NPRS scale: FACES 4/10 at time of eval Pain location: R shoulder injury over a year ago d/t a fall  Pain description: spouse reports sometimes pt yells out if reaching in a cabinet  Aggravating factors: reaching  Relieving factors: pain patch while in hospital, tylenol    FALLS: Has patient fallen in last 6 months? No  LIVING ENVIRONMENT: Lives with: lives with their spouse Lives in: House/apartment Stairs: 1 Has following equipment at home: built in shower stool within walk in shower, rail outside the shower, rails by both toilets, one toilet is elevated.    PLOF: Independent, retired from administrator, sports, active in  community, enjoyed shopping with friends   PATIENT GOALS: Spouse hopeful for pt to regain some indep with daily tasks  OBJECTIVE:  Note: Objective measures were completed at Evaluation unless otherwise noted.  HAND DOMINANCE: Right  ADLs: Overall ADLs: Spouse providing direct supv and assist d/t cognitive decline post CVA impacting ADL performance  Transfers/ambulation related to ADLs: indep without assistive device  Eating: spouse reports pt is eating with a fork in the R hand ok  Grooming: limited d/t difficulty reaching with the R dominant arm d/t old shoulder injury UB Dressing:  assist to don shirt overhead (old R shoulder injury with increased pain when raising arm) LB Dressing: difficulty reaching down to feet, can not fully lift R leg to cross over L knee; able to cross L leg over R knee to untie L shoe Toileting: assist with changing brief; did not wear a brief before CVA, urinary urgency prior to CVA, but would wear a pad only, not a brief  Bathing: assist with sequencing Tub Shower transfers: SBA Equipment: see above  IADLs: Shopping: Dep Light housekeeping: max A (would need at least supv d/t cognitive impairment) Meal Prep: Supv d/t cognitive deficits, but pt is participating in meal prep Community mobility: indep without AD Medication management: spouse currently Banker: spouse manages Handwriting: 100% legible  POSTURE COMMENTS:  No Significant postural limitations  ACTIVITY TOLERANCE: Activity tolerance: WFL for community mobility and ADLs  UPPER EXTREMITY ROM:    Active ROM Right eval Left eval  Shoulder flexion 82 WNL  Shoulder abduction 60 WNL  Shoulder adduction    Shoulder extension    Shoulder internal rotation WFL WNL  Shoulder external rotation ~ 60* (R hand touches behind head with chin tuck)  WNL  Elbow flexion    Elbow extension    Wrist flexion    Wrist extension    Wrist ulnar deviation    Wrist radial deviation     Wrist pronation    Wrist supination    (Blank rows = not tested)  UPPER EXTREMITY MMT:     MMT Right Eval Hx of old R shoulder injury/impingement syndrome Left eval  Shoulder flexion 3-  5  Shoulder abduction 3-  5  Shoulder adduction    Shoulder extension    Shoulder internal rotation 4+ 4+  Shoulder external rotation 4 (elbow at side within available range) 4+  Middle trapezius    Lower trapezius    Elbow flexion 4+ 5  Elbow extension 4+ 5  Wrist flexion 4+ 5  Wrist extension 4+ 5  Wrist ulnar deviation    Wrist radial deviation    Wrist pronation    Wrist supination    (Blank rows = not tested)  HAND FUNCTION: Grip strength: Right: 20 lbs; Left: 37 lbs, Lateral pinch: Right: 6 lbs, Left: 9 lbs, and 3 point pinch: Right: 4 lbs, Left: 6 lbs  COORDINATION: Finger Nose Finger test: intact bilaterally  9 Hole Peg test: Right: 40 sec; Left: 33 sec  SENSATION: Unable to accurately assess d/t impaired cognition  EDEMA: No visible edema  MUSCLE  TONE: RUE: Within functional limits  COGNITION: Overall cognitive status: Impaired; spouse assists to answer questions accurately  VISION:  Subjective report: wears glasses at baseline  VISION ASSESSMENT: TBD Patient has difficulty with following activities due to following visual impairments: TBD  PERCEPTION: Not tested  PRAXIS: Impaired: Motor planning, very mild on the R  OBSERVATIONS:  Pt pleasant, cooperative, flat affect, requires increased time for processing and assist from spouse to respond accurately to questions.                                                                                                              TREATMENT DATE: 07/28/2024  Therapeutic Ex.BETHA LONE with PROM to the end range  was performed with the right shoulder. -Pt. Performed AAROM shoulder ROM at the tabletop surface.  Self Care:  -Pt. was independent donning, and doffing her sweater. Occassional assist was required  initially with the sleeve when doffing the sweater.  -Pt. Was independent donning and doffing the right shoe, and sock with increased time using a foot stool.   Therapeutic Activities:   -Pink Theraputty right hand strengthening exercises were performed including: gross grip strengthening, lateral, 2pt. Pinch, and 3pt. pinch strengthening, thumb, and manipulating putty within the tips of fingers to remove small coins. The HEP was reviewed with the Pt. for gross grip strengthening, lateral pinch, 3pt. Pinch, 2pt. Pinch strengthening, log rolling, manipulating the putty for small objects, and rolling putty into small spheres.   PATIENT EDUCATION: Education details: OT role, goals, poc, ADL strategies Person educated: Patient and Spouse Education method: Explanation and Verbal cues Education comprehension: verbalized understanding and needs further education  HOME EXERCISE PROGRAM: To be initiated in upcoming sessions  GOALS: Goals reviewed with patient? Yes  SHORT TERM GOALS: Target date: 08/13/24  Pt will perform HEP for increasing RUE strength and maintaining shoulder mobility for improving ADL performance. Baseline: Eval: Not yet initiated. Goal status: INITIAL   LONG TERM GOALS: Target date: 09/24/24  Pt will increase R grip strength by 15 lbs or more lbs in order to increase ability to hold and stabilize heavy ADL supplies in the R dominant hand. Baseline: Eval: R grip 20 lbs (L 37 lbs) Goal status: INITIAL  2.  Pt will perform bathing tasks with min vc from spouse to ensure thoroughness.  Baseline: Eval: Pt requires at least min A from spouse d/t impaired sequencing, tendency to perseverate on 1 area when washing. Goal status: INITIAL  3.  Pt will improve R hand coordination as demonstrated by completion of 9 Hole peg test in 30 sec or less for improving manipulation of small ADL supplies with R dominant hand. Baseline: Eval: R 40 sec (L 33 sec) Goal status: INITIAL  4.  Pt  will improve RUE shoulder mobility to [facility] for enabling pt to independently don shirt overhead.  Baseline: Eval: Spouse assists; old shoulder injury (R shoulder flex 82, abd 60, ER ~60 with chin tuck to touch back of head Goal status: INITIAL  5.  Pt will  perform LB ADLs with distant supv.   Baseline: Eval: Mod A d/t difficulty reaching to feet  Goal status: INITIAL  ASSESSMENT:  CLINICAL IMPRESSION:  Pt. continues to tolerate right shoulder exercises, and the  table glide exercises, and require d less assist proximally at the RUE today. No reports, or facial indications of pain with the exercises. Reviewed with HEP program for hand strengthening with pink theraputty. Pt. Presents with limited initiation of conversation, however Pt. was able to respond more to questions today. Pt. continues to benefit from review of exercises to maintain R shoulder mobility/decrease pain, establish HEP, to provide exercises and activities for increasing R hand strength and coordination skills, and for ADL training in order to work towards improving indep with daily tasks for reducing burden of care on spouse and improving pt QOL.  Pt/spouse in agreement with plan.     PERFORMANCE DEFICITS: in functional skills including ADLs, IADLs, coordination, dexterity, ROM, strength, pain, flexibility, Fine motor control, Gross motor control, mobility, balance, body mechanics, endurance, decreased knowledge of precautions, decreased knowledge of use of DME, and UE functional use, cognitive skills including attention, problem solving, safety awareness, sequencing, temperament/personality, thought, and understand, and psychosocial skills including coping strategies, environmental adaptation, habits, interpersonal interactions, and routines and behaviors.   IMPAIRMENTS: are limiting patient from ADLs, IADLs, leisure, and social participation.   CO-MORBIDITIES: has co-morbidities such as R rotator cuff impingement syndrome, HTN  that affects occupational performance. Patient will benefit from skilled OT to address above impairments and improve overall function.  MODIFICATION OR ASSISTANCE TO COMPLETE EVALUATION: Min-Moderate modification of tasks or assist with assess necessary to complete an evaluation.  OT OCCUPATIONAL PROFILE AND HISTORY: Detailed assessment: Review of records and additional review of physical, cognitive, psychosocial history related to current functional performance.  CLINICAL DECISION MAKING: Moderate - several treatment options, min-mod task modification necessary  REHAB POTENTIAL: Fair    EVALUATION COMPLEXITY: Moderate    PLAN:  OT FREQUENCY: 1-2x/week  OT DURATION: 12 weeks  PLANNED INTERVENTIONS: 97168 OT Re-evaluation, 97535 self care/ADL training, 02889 therapeutic exercise, 97530 therapeutic activity, 97112 neuromuscular re-education, and 97140 manual therapy  RECOMMENDED OTHER SERVICES: Multi-discipline evaluations today for OT/PT/SLP services  CONSULTED AND AGREED WITH PLAN OF CARE: Patient and family adult nurse (spouse)  PLAN FOR NEXT SESSION: see above  Jari Carollo T Eliot Bencivenga, OTR/L, CLT  07/31/2024, 7:31 AM    "

## 2024-08-04 ENCOUNTER — Ambulatory Visit: Admitting: Physical Therapy

## 2024-08-04 ENCOUNTER — Ambulatory Visit: Admitting: Speech Pathology

## 2024-08-04 ENCOUNTER — Ambulatory Visit: Admitting: Occupational Therapy

## 2024-08-04 DIAGNOSIS — M6281 Muscle weakness (generalized): Secondary | ICD-10-CM

## 2024-08-04 DIAGNOSIS — R278 Other lack of coordination: Secondary | ICD-10-CM

## 2024-08-04 DIAGNOSIS — R41841 Cognitive communication deficit: Secondary | ICD-10-CM

## 2024-08-04 NOTE — Therapy (Incomplete)
 " OUTPATIENT OCCUPATIONAL THERAPY NEURO TREATMENT NOTE  Patient Name: Abigail Malone MRN: [id number] DOB:1951/04/20, 74 y.o., female Today's Date: 08/04/2024  PCP: Toribio Hoyle, PA REFERRING PROVIDER: Dr. Norleen Punch  END OF SESSION:  OT End of Session - 08/04/24 1504     Visit Number 6    Number of Visits 24    Date for Recertification  09/24/24    OT Start Time 1445    OT Stop Time 1530    OT Time Calculation (min) 45 min    Activity Tolerance Patient tolerated treatment well    Behavior During Therapy Flat affect;WFL for tasks assessed/performed         Past Medical History:  Diagnosis Date   Abnormal uterine bleeding (AUB) 05/15/2016   Allergic rhinitis, seasonal 12/09/2014   Anemia, unspecified 11/18/2013   Atypical ductal hyperplasia of right breast 09/19/2021   Cardiac syncope 05/18/2019   HTN (hypertension) 11/18/2013   Hypertension    Menopausal symptoms 08/05/2014   Right knee pain 07/26/2015   Stroke [facility])    Past Surgical History:  Procedure Laterality Date   BREAST BIOPSY Right 09/07/2021   stereo calcs, ribbon clip, path pending.   BREAST BIOPSY WITH RADIO FREQUENCY LOCALIZER Right 10/18/2021   Procedure: BREAST BIOPSY WITH RADIO FREQUENCY LOCALIZER;  Surgeon: Lane Shope, MD;  Location: [facility] ORS;  Service: General;  Laterality: Right;   BREAST EXCISIONAL BIOPSY Right 2023   COLONOSCOPY  ~2018   Patient Active Problem List   Diagnosis Date Noted   Hemiparesis affecting right side as late effect of cerebrovascular accident (CVA) (HCC) 06/29/2024   Elevated LFTs 06/29/2024   Rotator cuff impingement syndrome, right 02/25/2023   Obesity with body mass index (BMI) of 30.0 to 39.9 01/01/2020   Hyperlipidemia, mixed 05/18/2019   Mixed stress and urge urinary incontinence 04/01/2018   Right knee pain 07/26/2015   Allergic rhinitis, seasonal 12/09/2014   HTN (hypertension) 11/18/2013   Anemia, unspecified 11/18/2013   ONSET DATE:  03/2024  REFERRING DIAG: Z74.09,Z78.9 (ICD-10-CM) - Impaired mobility and ADLs   THERAPY DIAG:  Muscle weakness (generalized)  Other lack of coordination  Rationale for Evaluation and Treatment: Rehabilitation  SUBJECTIVE:   SUBJECTIVE STATEMENT:   Pt.reports doing okay today. Pt accompanied by: self and significant other (Spouse, [name])  PERTINENT HISTORY: Per EMR: Dvora is a 74 y.o. female with history of left MCA ischemic stroke Sep 2025 with sequelae including mild right hemiparesis, memory loss, cognitive-communication impairment, and reduced functional independence with mobility and ADLs. Per [facility] Phys Med note 06/16/2024, patient has been referred to PT, OT, cognitive rehab, speech therapy. Most recent visit with primary care was 06/12/2024 by [facility] clinic (Dr. Salli) where HCTZ dose was increased to 25 mg daily, nifedipine was increased to 60 mg daily, and carvedilol  was also increased to 25 mg twice daily. 3 days later she was seen in the ED for altered mental status which was felt to be possibly related to recent medication changes, as the workup was negative for acute findings.   Recent d/c from [facility] post CVA.  PRECAUTIONS: None  WEIGHT BEARING RESTRICTIONS: No  PAIN: 07/31/24: No pain reported today Are you having pain? Yes: NPRS scale: FACES 4/10 at time of eval Pain location: R shoulder injury over a year ago d/t a fall  Pain description: spouse reports sometimes pt yells out if reaching in a cabinet  Aggravating factors: reaching  Relieving factors: pain patch while in hospital, tylenol    FALLS: Has patient  fallen in last 6 months? No  LIVING ENVIRONMENT: Lives with: lives with their spouse Lives in: House/apartment Stairs: 1 Has following equipment at home: built in shower stool within walk in shower, rail outside the shower, rails by both toilets, one toilet is elevated.    PLOF: Independent, retired from administrator, sports, active in community, enjoyed shopping with  friends   PATIENT GOALS: Spouse hopeful for pt to regain some indep with daily tasks  OBJECTIVE:  Note: Objective measures were completed at Evaluation unless otherwise noted.  HAND DOMINANCE: Right  ADLs: Overall ADLs: Spouse providing direct supv and assist d/t cognitive decline post CVA impacting ADL performance  Transfers/ambulation related to ADLs: indep without assistive device  Eating: spouse reports pt is eating with a fork in the R hand ok  Grooming: limited d/t difficulty reaching with the R dominant arm d/t old shoulder injury UB Dressing:  assist to don shirt overhead (old R shoulder injury with increased pain when raising arm) LB Dressing: difficulty reaching down to feet, can not fully lift R leg to cross over L knee; able to cross L leg over R knee to untie L shoe Toileting: assist with changing brief; did not wear a brief before CVA, urinary urgency prior to CVA, but would wear a pad only, not a brief  Bathing: assist with sequencing Tub Shower transfers: SBA Equipment: see above  IADLs: Shopping: Dep Light housekeeping: max A (would need at least supv d/t cognitive impairment) Meal Prep: Supv d/t cognitive deficits, but pt is participating in meal prep Community mobility: indep without AD Medication management: spouse currently Banker: spouse manages Handwriting: 100% legible  POSTURE COMMENTS:  No Significant postural limitations  ACTIVITY TOLERANCE: Activity tolerance: WFL for community mobility and ADLs  UPPER EXTREMITY ROM:    Active ROM Right eval Left eval  Shoulder flexion 82 WNL  Shoulder abduction 60 WNL  Shoulder adduction    Shoulder extension    Shoulder internal rotation WFL WNL  Shoulder external rotation ~ 60* (R hand touches behind head with chin tuck)  WNL  Elbow flexion    Elbow extension    Wrist flexion    Wrist extension    Wrist ulnar deviation    Wrist radial deviation    Wrist pronation    Wrist  supination    (Blank rows = not tested)  UPPER EXTREMITY MMT:     MMT Right Eval Hx of old R shoulder injury/impingement syndrome Left eval  Shoulder flexion 3-  5  Shoulder abduction 3-  5  Shoulder adduction    Shoulder extension    Shoulder internal rotation 4+ 4+  Shoulder external rotation 4 (elbow at side within available range) 4+  Middle trapezius    Lower trapezius    Elbow flexion 4+ 5  Elbow extension 4+ 5  Wrist flexion 4+ 5  Wrist extension 4+ 5  Wrist ulnar deviation    Wrist radial deviation    Wrist pronation    Wrist supination    (Blank rows = not tested)  HAND FUNCTION: Grip strength: Right: 20 lbs; Left: 37 lbs, Lateral pinch: Right: 6 lbs, Left: 9 lbs, and 3 point pinch: Right: 4 lbs, Left: 6 lbs  COORDINATION: Finger Nose Finger test: intact bilaterally  9 Hole Peg test: Right: 40 sec; Left: 33 sec  SENSATION: Unable to accurately assess d/t impaired cognition  EDEMA: No visible edema  MUSCLE TONE: RUE: Within functional limits  COGNITION: Overall cognitive status: Impaired; spouse assists  to answer questions accurately  VISION:  Subjective report: wears glasses at baseline  VISION ASSESSMENT: TBD Patient has difficulty with following activities due to following visual impairments: TBD  PERCEPTION: Not tested  PRAXIS: Impaired: Motor planning, very mild on the R  OBSERVATIONS:  Pt pleasant, cooperative, flat affect, requires increased time for processing and assist from spouse to respond accurately to questions.                                                                                                              TREATMENT DATE: 07/31/2024 Therapeutic Ex.:  -Facilitated increased R shoulder mobility for daily tasks:  -Table slides for R shoulder into forward flexion and abd; OT assist with positioning for optimal form to target abd with reduced substitution -Performed passive stretching for R shoulder ER, working to increase  range for reach behind head for UB ADLs.  Began with elbow at side, progressing to shoulder abducted to 45* and 60* -R hand strengthening: hand gripper set at min resistance (2 red bands) to completed 3 sets 10 reps of grip squeezes.  Min vc for increasing digit flexion with each rep. -R pinch strengthening: use of yellow, red, and green therapy resistant clothespins, moving pins on/off a vertical dowel using a lateral and 3 point pinch.  Mod vc and tactile cues to correctly formulate pinch patterns.  Self Care: -Simulated UB bathing with washcloth to assess presence of perseveration; none noted today.  OT conferenced with SLP who also confirms that pt has no longer been demonstrating perseveration with motor activities during SLP visits. -Reviewed ADL progress with spouse to identify focus areas moving forward.  Spouse indicates pt still tends to require assist for task efficiency when there are time constraints.  Spouse also reports that pt still tends to fatigue after completion of ADLs.    PATIENT EDUCATION: Education details: Progress with ADLs Person educated: Patient and Spouse Education method: Explanation and Verbal cues Education comprehension: verbalized understanding and needs further education  HOME EXERCISE PROGRAM: To be initiated in upcoming sessions  GOALS: Goals reviewed with patient? Yes  SHORT TERM GOALS: Target date: 08/13/24  Pt will perform HEP for increasing RUE strength and maintaining shoulder mobility for improving ADL performance. Baseline: Eval: Not yet initiated. Goal status: INITIAL   LONG TERM GOALS: Target date: 09/24/24  Pt will increase R grip strength by 15 lbs or more lbs in order to increase ability to hold and stabilize heavy ADL supplies in the R dominant hand. Baseline: Eval: R grip 20 lbs (L 37 lbs) Goal status: INITIAL  2.  Pt will perform bathing tasks with min vc from spouse to ensure thoroughness.  Baseline: Eval: Pt requires at least min A  from spouse d/t impaired sequencing, tendency to perseverate on 1 area when washing. Goal status: INITIAL  3.  Pt will improve R hand coordination as demonstrated by completion of 9 Hole peg test in 30 sec or less for improving manipulation of small ADL supplies with R dominant hand. Baseline: Eval: R 40  sec (L 33 sec) Goal status: INITIAL  4.  Pt will improve RUE shoulder mobility to [facility] for enabling pt to independently don shirt overhead.  Baseline: Eval: Spouse assists; old shoulder injury (R shoulder flex 82, abd 60, ER ~60 with chin tuck to touch back of head Goal status: INITIAL  5.  Pt will perform LB ADLs with distant supv.   Baseline: Eval: Mod A d/t difficulty reaching to feet  Goal status: INITIAL  ASSESSMENT:  CLINICAL IMPRESSION  Pt demonstrated good tolerance to RUE flexibility and strengthening exercises noted above; no R shoulder pain reported and pt able to reach behind head with R hand with less substitution (chin tuck) following exercises noted above.  Though improving, R hand strength remains weak, with pt not yet tolerating blue clothespins, and limited to 2 red bands on hand gripper.  Spouse acknowledges pt is requiring less physical assist with ADL completion, but spouse still has to step in if working under time constraints, as he reports pt continues to complete activities slowly, and ADLs still cause fatigue.  Pt. continues to benefit from review of and participation in exercises to increase R shoulder mobility, to provide exercises and activities for increasing R hand strength and coordination skills, and for ADL training in order to work towards improving indep with daily tasks for reducing burden of care on spouse and improving pt QOL.       PERFORMANCE DEFICITS: in functional skills including ADLs, IADLs, coordination, dexterity, ROM, strength, pain, flexibility, Fine motor control, Gross motor control, mobility, balance, body mechanics, endurance, decreased  knowledge of precautions, decreased knowledge of use of DME, and UE functional use, cognitive skills including attention, problem solving, safety awareness, sequencing, temperament/personality, thought, and understand, and psychosocial skills including coping strategies, environmental adaptation, habits, interpersonal interactions, and routines and behaviors.   IMPAIRMENTS: are limiting patient from ADLs, IADLs, leisure, and social participation.   CO-MORBIDITIES: has co-morbidities such as R rotator cuff impingement syndrome, HTN that affects occupational performance. Patient will benefit from skilled OT to address above impairments and improve overall function.  MODIFICATION OR ASSISTANCE TO COMPLETE EVALUATION: Min-Moderate modification of tasks or assist with assess necessary to complete an evaluation.  OT OCCUPATIONAL PROFILE AND HISTORY: Detailed assessment: Review of records and additional review of physical, cognitive, psychosocial history related to current functional performance.  CLINICAL DECISION MAKING: Moderate - several treatment options, min-mod task modification necessary  REHAB POTENTIAL: Fair    EVALUATION COMPLEXITY: Moderate  PLAN:  OT FREQUENCY: 1-2x/week  OT DURATION: 12 weeks  PLANNED INTERVENTIONS: 97168 OT Re-evaluation, 97535 self care/ADL training, 02889 therapeutic exercise, 97530 therapeutic activity, 97112 neuromuscular re-education, and 97140 manual therapy  RECOMMENDED OTHER SERVICES: Multi-discipline evaluations today for OT/PT/SLP services  CONSULTED AND AGREED WITH PLAN OF CARE: Patient and family adult nurse (spouse)  PLAN FOR NEXT SESSION: see above  Richardson Otter, MS, OTR/L   08/04/2024, 3:07 PM    "

## 2024-08-04 NOTE — Therapy (Signed)
 " OUTPATIENT SPEECH LANGUAGE PATHOLOGY  COGNITIVE COMMUNICATION  TREATMENT NOTE   Patient Name: Abigail Malone MRN: [id number] DOB:[birth year], 74 y.o., female Today's Date: 08/04/2024  PCP: Toribio Hoyle, PA REFERRING PROVIDER: Norleen Punch, MD   End of Session - 08/04/24 1434     Visit Number 7    Number of Visits 25    Date for Recertification  09/24/24    Authorization Type [employer]    Progress Note Due on Visit 10    SLP Start Time 1400    SLP Stop Time  1445    SLP Time Calculation (min) 45 min    Activity Tolerance Patient tolerated treatment well          Past Medical History:  Diagnosis Date   Abnormal uterine bleeding (AUB) 05/15/2016   Allergic rhinitis, seasonal 12/09/2014   Anemia, unspecified 11/18/2013   Atypical ductal hyperplasia of right breast 09/19/2021   Cardiac syncope 05/18/2019   HTN (hypertension) 11/18/2013   Hypertension    Menopausal symptoms 08/05/2014   Right knee pain 07/26/2015   Stroke [facility])    Past Surgical History:  Procedure Laterality Date   BREAST BIOPSY Right 09/07/2021   stereo calcs, ribbon clip, path pending.   BREAST BIOPSY WITH RADIO FREQUENCY LOCALIZER Right 10/18/2021   Procedure: BREAST BIOPSY WITH RADIO FREQUENCY LOCALIZER;  Surgeon: Lane Shope, MD;  Location: [facility] ORS;  Service: General;  Laterality: Right;   BREAST EXCISIONAL BIOPSY Right 2023   COLONOSCOPY  ~2018   Patient Active Problem List   Diagnosis Date Noted   Hemiparesis affecting right side as late effect of cerebrovascular accident (CVA) (HCC) 06/29/2024   Elevated LFTs 06/29/2024   Rotator cuff impingement syndrome, right 02/25/2023   Obesity with body mass index (BMI) of 30.0 to 39.9 01/01/2020   Hyperlipidemia, mixed 05/18/2019   Mixed stress and urge urinary incontinence 04/01/2018   Right knee pain 07/26/2015   Allergic rhinitis, seasonal 12/09/2014   HTN (hypertension) 11/18/2013   Anemia, unspecified 11/18/2013     ONSET DATE: 04/10/2024: date of referral  06/23/2024  REFERRING DIAG: R47.01 (ICD-10-CM) - Aphasia   THERAPY DIAG:  Cognitive communication deficit  Rationale for Evaluation and Treatment Rehabilitation  SUBJECTIVE:   PERTINENT HISTORY and DIAGNOSTIC FINDINGS: Pt is a 74 year old female with pertinent medical history of HTN who was admitted to [facility] on 04/10/2024 for AMS, found to have a small acute infarct in the left basal ganglia and left corona radiata on MRI.   MRI Brain 04/11/2024: 1. Small acute infarct involving the left basal ganglia and corona radiata. 2. Chronic infarcts in the right basal ganglia and left occipital lobe. 3. 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 Recommend ENT follow-up if this lesion has not already been evaluated.   She was admitted to [facility] from 04/16/24 through 05/06/24, following a left MCA territory ischemic stroke.  MRI 04/21/2024 Multiple small foci of abnormal diffusion restriction in the left caudate body, corona radiata, and superior left lentiform nucleus with corresponding hyperintense T2/FLAIR signal. Chronic infarcts in the right basal ganglia and left occipital lobe. Hyperintense T2/FLAIR signal in the deep and subcortical white matter reflects sequelae of senescent small vessel ischemic disease.Diffuse parenchymal volume loss with corresponding prominence of ventricles and subarachnoid spaces. No abnormal intracranial enhancement identified.In the left parotid space, contiguous with the retromandibular vein and at least partially involving the deep left parotid lobe,  there is an enhancing and T2 hyperintense lesion which measures up to 2.4 x 1.7 cm in maximal axial dimensions. This lesion demonstrates diffusion restriction.  Pt received HHST services for post-stroke sequelae include mildright hemiparesis, memory loss, cognitive-communication impairment,  and reduced functional independence with mobility and ADLs.  PAIN:  Are you having pain? No   FALLS: Has patient fallen in last 6 months?  No  LIVING ENVIRONMENT: Lives with: lives with their spouse Lives in: House/apartment  PLOF:  Level of assistance: Independent with ADLs, Independent with IADLs Employment: Retired   PATIENT GOALS   to unaware to state d/t decreased awareness of deficits, pt's husband states he would like for pt to improve her speech, memory and ability to participate in ADLs and iADLs  SUBJECTIVE STATEMENT: Pt quiet but increased participation and initiation during today's session Pt accompanied by: alone, her husband stayed in the waiting area   OBJECTIVE:   TODAY'S TREATMENT:  Skilled treatment session targeted pt's cognitive communication goals. SLP facilitated session by providing the following interventions:  Pt given navigation task to cafeteria to locate 2 items. Pt independent and with louder vocal intensity during physical movement during causal conversation while walking > 85% speech intelligibility.   Pt with decreased vocal intensity when returning to office and while working with her hands. Pt given a written instructions which is was able to read and with minimal increase in time, pt able to follow to create Valentine's Day garland.   With maximal faded to moderate verbal cues for verbal initiation, pt able to describe game to new student. Hen playing the game, pt benefit from consistent cues for divided attention.    PATIENT EDUCATION: Education details: results of this assessment, ST POC Person educated: Patient and Spouse Education method: Explanation Education comprehension: needs further education   HOME EXERCISE PROGRAM:   Increase participation in social activities such as church   GOALS:  Goals reviewed with patient? Yes  SHORT TERM GOALS: Target date: 10 sessions  With maximal cues, pt will demonstrate safe swallow  strategies when consuming pills in 5 weeks.  Baseline: Goal status: INITIAL  2.  With moderate cues, pt will complete a basic multi-step task (e.g., making a sandwich) with superivision within 5 weeks.  Baseline:  Goal status: INITIAL  3.  With moderate cues, pt will improve sustained attention to basic tasks for 10 minutes.  Baseline:  Goal status: INITIAL  4.  With maximal cues, pt will sustain phonation for 5-7 seconds at 65 dB in 8 out of 10 opportunities across 4 sessions.  Baseline:  Goal status: INITIAL  5. With maximal cues, pt will initiate commenting on a basic topic 1 times during a structured activity in 5 out of 7 opportunities.  Baseline:   Goal Status: INITIAL   LONG TERM GOALS: Target date: 09/24/2024  With maximal cues, pt will initiate asking a question or basic topic 1 times during a structured activity in 5 out of 7 opportunities. Baseline:  Goal status: INITIAL  2.  With maximal cues, pt will produce one word utterances at 65 dB in 8 out of 10 opportunities across 5 sessions.  Baseline:  Goal status: INITIAL  3.  With moderate cues, pt will sustain attention to basic task for 15 minutes in 3 out of 4 sessions.  Baseline:  Goal status: INITIAL  ASSESSMENT:  CLINICAL IMPRESSION: Patient is a 74 y.o. 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 Her voice is so quiet/soft that she is almost inaudible, when audible, her speech is monotone with suspected difficulty in initiating speech (delay in starting to speak) - her husband reports decreased initiation of spontaneous speech. She appears to have emotional blunting, flat affect, no eye contact (frequently looking down at her hands). As a result, pt is dependent on others for assistance with ADLs  and iADLs.   Pt continues with good response to therapy activities. She also reports that she went to church this Sunday and enjoyed it. See the above treatment note for details.    OBJECTIVE IMPAIRMENTS include attention, memory, awareness, executive functioning, expressive language, dysarthria, and dysphagia. These impairments are limiting patient from managing medications, managing appointments, managing finances, household responsibilities, ADLs/IADLs, effectively communicating at home and in community, and safety when swallowing. Factors affecting potential to achieve goals and functional outcome are ability to learn/carryover information, co-morbidities, medical prognosis, previous level of function, severity of impairments, and family/community support. Patient will benefit from skilled SLP services to address above impairments and improve overall function.  REHAB POTENTIAL: Good  PLAN: SLP FREQUENCY: 1-2x/week  SLP DURATION: 12 weeks  PLANNED INTERVENTIONS: Aspiration precaution training, Diet toleration management , Language facilitation, Environmental controls, Trials of upgraded texture/liquids, Cueing hierachy, Cognitive reorganization, Internal/external aids, Functional tasks, Multimodal communication approach, SLP instruction and feedback, Compensatory strategies, and Patient/family education   Jaman Aro B. Rubbie, M.S., CCC-SLP, CBIS Speech-Language Pathologist Certified Brain Injury Specialist [facility]  [facility] [phone] Ascom [phone] Fax [phone]  "

## 2024-08-06 ENCOUNTER — Ambulatory Visit: Admitting: Occupational Therapy

## 2024-08-06 ENCOUNTER — Ambulatory Visit: Admitting: Speech Pathology

## 2024-08-06 ENCOUNTER — Ambulatory Visit

## 2024-08-07 ENCOUNTER — Ambulatory Visit

## 2024-08-07 ENCOUNTER — Ambulatory Visit: Admitting: Speech Pathology

## 2024-08-07 DIAGNOSIS — R41841 Cognitive communication deficit: Secondary | ICD-10-CM

## 2024-08-07 DIAGNOSIS — M6281 Muscle weakness (generalized): Secondary | ICD-10-CM

## 2024-08-07 DIAGNOSIS — R278 Other lack of coordination: Secondary | ICD-10-CM

## 2024-08-07 NOTE — Therapy (Unsigned)
 " OUTPATIENT OCCUPATIONAL THERAPY NEURO TREATMENT NOTE  Patient Name: Abigail Malone MRN: [id number] DOB:January 27, 1951, 74 y.o., female Today's Date: 08/08/2024  PCP: Toribio Hoyle, PA REFERRING PROVIDER: Dr. Norleen Punch  END OF SESSION:  OT End of Session - 08/08/24 2048     Visit Number 7    Number of Visits 24    Date for Recertification  09/24/24    OT Start Time 1015    OT Stop Time 1100    OT Time Calculation (min) 45 min    Activity Tolerance Patient tolerated treatment well    Behavior During Therapy Flat affect;WFL for tasks assessed/performed          Past Medical History:  Diagnosis Date   Abnormal uterine bleeding (AUB) 05/15/2016   Allergic rhinitis, seasonal 12/09/2014   Anemia, unspecified 11/18/2013   Atypical ductal hyperplasia of right breast 09/19/2021   Cardiac syncope 05/18/2019   HTN (hypertension) 11/18/2013   Hypertension    Menopausal symptoms 08/05/2014   Right knee pain 07/26/2015   Stroke [facility])    Past Surgical History:  Procedure Laterality Date   BREAST BIOPSY Right 09/07/2021   stereo calcs, ribbon clip, path pending.   BREAST BIOPSY WITH RADIO FREQUENCY LOCALIZER Right 10/18/2021   Procedure: BREAST BIOPSY WITH RADIO FREQUENCY LOCALIZER;  Surgeon: Lane Shope, MD;  Location: [facility] ORS;  Service: General;  Laterality: Right;   BREAST EXCISIONAL BIOPSY Right 2023   COLONOSCOPY  ~2018   Patient Active Problem List   Diagnosis Date Noted   Hemiparesis affecting right side as late effect of cerebrovascular accident (CVA) (HCC) 06/29/2024   Elevated LFTs 06/29/2024   Rotator cuff impingement syndrome, right 02/25/2023   Obesity with body mass index (BMI) of 30.0 to 39.9 01/01/2020   Hyperlipidemia, mixed 05/18/2019   Mixed stress and urge urinary incontinence 04/01/2018   Right knee pain 07/26/2015   Allergic rhinitis, seasonal 12/09/2014   HTN (hypertension) 11/18/2013   Anemia, unspecified 11/18/2013   ONSET DATE:  03/2024  REFERRING DIAG: Z74.09,Z78.9 (ICD-10-CM) - Impaired mobility and ADLs   THERAPY DIAG:  Muscle weakness (generalized)  Other lack of coordination  Rationale for Evaluation and Treatment: Rehabilitation  SUBJECTIVE:   SUBJECTIVE STATEMENT: Pt reported it had been awhile since she'd been able to carry her purse in her R hand (end of session leaving OT gym)  Pt accompanied by: self and significant other (Spouse, [name])  PERTINENT HISTORY: Per EMR: Maddix is a 74 y.o. female with history of left MCA ischemic stroke Sep 2025 with sequelae including mild right hemiparesis, memory loss, cognitive-communication impairment, and reduced functional independence with mobility and ADLs. Per [facility] note 06/16/2024, patient has been referred to PT, OT, cognitive rehab, speech therapy. Most recent visit with primary care was 06/12/2024 by [facility] (Dr. Salli) where HCTZ dose was increased to 25 mg daily, nifedipine  was increased to 60 mg daily, and carvedilol  was also increased to 25 mg twice daily. 3 days later she was seen in the ED for altered mental status which was felt to be possibly related to recent medication changes, as the workup was negative for acute findings.   Recent d/c from [facility] post CVA.  PRECAUTIONS: None  WEIGHT BEARING RESTRICTIONS: No  PAIN: 08/07/24: No pain reported, just stiffness (RUE) 07/31/24: No pain reported today Are you having pain? Yes: NPRS scale: FACES 4/10 at time of eval Pain location: R shoulder injury over a year ago d/t a fall  Pain description: spouse reports  sometimes pt yells out if reaching in a cabinet  Aggravating factors: reaching  Relieving factors: pain patch while in hospital, tylenol    FALLS: Has patient fallen in last 6 months? No  LIVING ENVIRONMENT: Lives with: lives with their spouse Lives in: House/apartment Stairs: 1 Has following equipment at home: built in shower stool within walk in shower, rail outside the shower,  rails by both toilets, one toilet is elevated.    PLOF: Independent, retired from administrator, sports, active in community, enjoyed shopping with friends   PATIENT GOALS: Spouse hopeful for pt to regain some indep with daily tasks  OBJECTIVE:  Note: Objective measures were completed at Evaluation unless otherwise noted.  HAND DOMINANCE: Right  ADLs: Overall ADLs: Spouse providing direct supv and assist d/t cognitive decline post CVA impacting ADL performance  Transfers/ambulation related to ADLs: indep without assistive device  Eating: spouse reports pt is eating with a fork in the R hand ok  Grooming: limited d/t difficulty reaching with the R dominant arm d/t old shoulder injury UB Dressing:  assist to don shirt overhead (old R shoulder injury with increased pain when raising arm) LB Dressing: difficulty reaching down to feet, can not fully lift R leg to cross over L knee; able to cross L leg over R knee to untie L shoe Toileting: assist with changing brief; did not wear a brief before CVA, urinary urgency prior to CVA, but would wear a pad only, not a brief  Bathing: assist with sequencing Tub Shower transfers: SBA Equipment: see above  IADLs: Shopping: Dep Light housekeeping: max A (would need at least supv d/t cognitive impairment) Meal Prep: Supv d/t cognitive deficits, but pt is participating in meal prep Community mobility: indep without AD Medication management: spouse currently Banker: spouse manages Handwriting: 100% legible  POSTURE COMMENTS:  No Significant postural limitations  ACTIVITY TOLERANCE: Activity tolerance: WFL for community mobility and ADLs  UPPER EXTREMITY ROM:    Active ROM Right eval Left eval  Shoulder flexion 82 WNL  Shoulder abduction 60 WNL  Shoulder adduction    Shoulder extension    Shoulder internal rotation WFL WNL  Shoulder external rotation ~ 60* (R hand touches behind head with chin tuck)  WNL  Elbow flexion    Elbow  extension    Wrist flexion    Wrist extension    Wrist ulnar deviation    Wrist radial deviation    Wrist pronation    Wrist supination    (Blank rows = not tested)  UPPER EXTREMITY MMT:     MMT Right Eval Hx of old R shoulder injury/impingement syndrome Left eval  Shoulder flexion 3-  5  Shoulder abduction 3-  5  Shoulder adduction    Shoulder extension    Shoulder internal rotation 4+ 4+  Shoulder external rotation 4 (elbow at side within available range) 4+  Middle trapezius    Lower trapezius    Elbow flexion 4+ 5  Elbow extension 4+ 5  Wrist flexion 4+ 5  Wrist extension 4+ 5  Wrist ulnar deviation    Wrist radial deviation    Wrist pronation    Wrist supination    (Blank rows = not tested)  HAND FUNCTION: Grip strength: Right: 20 lbs; Left: 37 lbs, Lateral pinch: Right: 6 lbs, Left: 9 lbs, and 3 point pinch: Right: 4 lbs, Left: 6 lbs  COORDINATION: Finger Nose Finger test: intact bilaterally  9 Hole Peg test: Right: 40 sec; Left: 33 sec  SENSATION:  Unable to accurately assess d/t impaired cognition  EDEMA: No visible edema  MUSCLE TONE: RUE: Within functional limits  COGNITION: Overall cognitive status: Impaired; spouse assists to answer questions accurately  VISION:  Subjective report: wears glasses at baseline  VISION ASSESSMENT: TBD Patient has difficulty with following activities due to following visual impairments: TBD  PERCEPTION: Not tested  PRAXIS: Impaired: Motor planning, very mild on the R  OBSERVATIONS:  Pt pleasant, cooperative, flat affect, requires increased time for processing and assist from spouse to respond accurately to questions.                                                                                                              TREATMENT DATE: 08/07/2024 Therapeutic Exercise: -Table slides for R shoulder flex, abd, horiz abd/add, min guard for encouraging increased end range within pain limits -R grip  strengthening: hand gripper set at mild-moderate resistance with 2 red bands: 3 sets 15 reps with min vc for maximizing digit flexion with each rep.   -Performed passive stretching for R shoulder ER, working to increase range for reach behind head for UB ADLs. Began with elbow at side, progressing to shoulder abducted to 45* and 60* -Performed passive stretching for R shoulder flex and abd to maximize end range without substitution, progressing to AROM with 1# wrist weight to complete R shoulder flex and abd (to 70-80*), and ER (to ~50*) within small planes of movement to reduce substitution patterns.  OT provided tactile cues proximally and distally for form/technique, and provided visual target for pt reaching to help reduce substitution patterns: 2 sets 10 reps each.  -Completed reps of AAROM for ER to reach hand behind head    Manual Therapy: -STM to R biceps tendon, volar elbow and proximal volar forearm, working to promote muscle relaxation to tight musculature, as pt tends to compensate with elbow flexion during all reaching activities.   PATIENT EDUCATION: Education details: Continued encouragement to engage the RUE into daily tasks Person educated: Patient Education method: Explanation and Verbal cues Education comprehension: verbalized understanding and needs further education  HOME EXERCISE PROGRAM: Table slides  GOALS: Goals reviewed with patient? Yes  SHORT TERM GOALS: Target date: 08/13/24  Pt will perform HEP for increasing RUE strength and maintaining shoulder mobility for improving ADL performance. Baseline: Eval: Not yet initiated. Goal status: INITIAL   LONG TERM GOALS: Target date: 09/24/24  Pt will increase R grip strength by 15 lbs or more lbs in order to increase ability to hold and stabilize heavy ADL supplies in the R dominant hand. Baseline: Eval: R grip 20 lbs (L 37 lbs) Goal status: INITIAL  2.  Pt will perform bathing tasks with min vc from spouse to ensure  thoroughness.  Baseline: Eval: Pt requires at least min A from spouse d/t impaired sequencing, tendency to perseverate on 1 area when washing. Goal status: INITIAL  3.  Pt will improve R hand coordination as demonstrated by completion of 9 Hole peg test in 30 sec or less for improving manipulation  of small ADL supplies with R dominant hand. Baseline: Eval: R 40 sec (L 33 sec) Goal status: INITIAL  4.  Pt will improve RUE shoulder mobility to [facility] for enabling pt to independently don shirt overhead.  Baseline: Eval: Spouse assists; old shoulder injury (R shoulder flex 82, abd 60, ER ~60 with chin tuck to touch back of head Goal status: INITIAL  5.  Pt will perform LB ADLs with distant supv.   Baseline: Eval: Mod A d/t difficulty reaching to feet  Goal status: INITIAL  ASSESSMENT:  CLINICAL IMPRESSION Pt continues to demonstrate improvements in RUE strength and R shoulder flexibility, noting improved reach behind head (minimal chin tuck) by end of session.  Pt sighed end of session after reaching behind her head, stating, This used to be a no-no, meaning that she wouldn't attempt this reach.  Pt also initiated carrying her purse in her R hand while exiting treatment gym, verbalizing that it had been awhile since she'd been able to do that.  Pt pleased with her progress thus far, but continues to benefit from additional therapy to target R shoulder flexibility and strengthening throughout the RUE in order to maximize indep with daily tasks.     PERFORMANCE DEFICITS: in functional skills including ADLs, IADLs, coordination, dexterity, ROM, strength, pain, flexibility, Fine motor control, Gross motor control, mobility, balance, body mechanics, endurance, decreased knowledge of precautions, decreased knowledge of use of DME, and UE functional use, cognitive skills including attention, problem solving, safety awareness, sequencing, temperament/personality, thought, and understand, and psychosocial  skills including coping strategies, environmental adaptation, habits, interpersonal interactions, and routines and behaviors.   IMPAIRMENTS: are limiting patient from ADLs, IADLs, leisure, and social participation.   CO-MORBIDITIES: has co-morbidities such as R rotator cuff impingement syndrome, HTN that affects occupational performance. Patient will benefit from skilled OT to address above impairments and improve overall function.  MODIFICATION OR ASSISTANCE TO COMPLETE EVALUATION: Min-Moderate modification of tasks or assist with assess necessary to complete an evaluation.  OT OCCUPATIONAL PROFILE AND HISTORY: Detailed assessment: Review of records and additional review of physical, cognitive, psychosocial history related to current functional performance.  CLINICAL DECISION MAKING: Moderate - several treatment options, min-mod task modification necessary  REHAB POTENTIAL: Fair    EVALUATION COMPLEXITY: Moderate  PLAN:  OT FREQUENCY: 1-2x/week  OT DURATION: 12 weeks  PLANNED INTERVENTIONS: 97168 OT Re-evaluation, 97535 self care/ADL training, 02889 therapeutic exercise, 97530 therapeutic activity, 97112 neuromuscular re-education, and 97140 manual therapy  RECOMMENDED OTHER SERVICES: Multi-discipline evaluations today for OT/PT/SLP services  CONSULTED AND AGREED WITH PLAN OF CARE: Patient and family adult nurse (spouse)  PLAN FOR NEXT SESSION: see above  Inocente Blazing, MS, OTR/L   08/08/2024, 8:50 PM    "

## 2024-08-07 NOTE — Therapy (Signed)
 " OUTPATIENT SPEECH LANGUAGE PATHOLOGY  COGNITIVE COMMUNICATION  TREATMENT NOTE   Patient Name: Abigail Malone MRN: [id number] DOB:1950-07-27, 74 y.o., female Today's Date: 08/07/2024  PCP: Toribio Hoyle, PA REFERRING PROVIDER: Norleen Punch, MD   End of Session - 08/07/24 1317     Visit Number 8    Number of Visits 25    Date for Recertification  09/24/24    Authorization Type [employer]    Progress Note Due on Visit 10    SLP Start Time 0930    SLP Stop Time  1015    SLP Time Calculation (min) 45 min    Activity Tolerance Patient tolerated treatment well          Past Medical History:  Diagnosis Date   Abnormal uterine bleeding (AUB) 05/15/2016   Allergic rhinitis, seasonal 12/09/2014   Anemia, unspecified 11/18/2013   Atypical ductal hyperplasia of right breast 09/19/2021   Cardiac syncope 05/18/2019   HTN (hypertension) 11/18/2013   Hypertension    Menopausal symptoms 08/05/2014   Right knee pain 07/26/2015   Stroke [facility])    Past Surgical History:  Procedure Laterality Date   BREAST BIOPSY Right 09/07/2021   stereo calcs, ribbon clip, path pending.   BREAST BIOPSY WITH RADIO FREQUENCY LOCALIZER Right 10/18/2021   Procedure: BREAST BIOPSY WITH RADIO FREQUENCY LOCALIZER;  Surgeon: Lane Shope, MD;  Location: [facility] ORS;  Service: General;  Laterality: Right;   BREAST EXCISIONAL BIOPSY Right 2023   COLONOSCOPY  ~2018   Patient Active Problem List   Diagnosis Date Noted   Hemiparesis affecting right side as late effect of cerebrovascular accident (CVA) (HCC) 06/29/2024   Elevated LFTs 06/29/2024   Rotator cuff impingement syndrome, right 02/25/2023   Obesity with body mass index (BMI) of 30.0 to 39.9 01/01/2020   Hyperlipidemia, mixed 05/18/2019   Mixed stress and urge urinary incontinence 04/01/2018   Right knee pain 07/26/2015   Allergic rhinitis, seasonal 12/09/2014   HTN (hypertension) 11/18/2013   Anemia, unspecified 11/18/2013     ONSET DATE: 04/10/2024: date of referral  06/23/2024  REFERRING DIAG: R47.01 (ICD-10-CM) - Aphasia   THERAPY DIAG:  Cognitive communication deficit  Rationale for Evaluation and Treatment Rehabilitation  SUBJECTIVE:   PERTINENT HISTORY and DIAGNOSTIC FINDINGS: Pt is a 74 year old female with pertinent medical history of HTN who was admitted to [facility] on 04/10/2024 for AMS, found to have a small acute infarct in the left basal ganglia and left corona radiata on MRI.   MRI Brain 04/11/2024: 1. Small acute infarct involving the left basal ganglia and corona radiata. 2. Chronic infarcts in the right basal ganglia and left occipital lobe. 3. 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 Recommend ENT follow-up if this lesion has not already been evaluated.   She was admitted to [facility] from 04/16/24 through 05/06/24, following a left MCA territory ischemic stroke.  MRI 04/21/2024 Multiple small foci of abnormal diffusion restriction in the left caudate body, corona radiata, and superior left lentiform nucleus with corresponding hyperintense T2/FLAIR signal. Chronic infarcts in the right basal ganglia and left occipital lobe. Hyperintense T2/FLAIR signal in the deep and subcortical white matter reflects sequelae of senescent small vessel ischemic disease.Diffuse parenchymal volume loss with corresponding prominence of ventricles and subarachnoid spaces. No abnormal intracranial enhancement identified.In the left parotid space, contiguous with the retromandibular vein and at least partially involving the deep left parotid lobe,  there is an enhancing and T2 hyperintense lesion which measures up to 2.4 x 1.7 cm in maximal axial dimensions. This lesion demonstrates diffusion restriction.  Pt received HHST services for post-stroke sequelae include mildright hemiparesis, memory loss, cognitive-communication impairment,  and reduced functional independence with mobility and ADLs.  PAIN:  Are you having pain? No   FALLS: Has patient fallen in last 6 months?  No  LIVING ENVIRONMENT: Lives with: lives with their spouse Lives in: House/apartment  PLOF:  Level of assistance: Independent with ADLs, Independent with IADLs Employment: Retired   PATIENT GOALS   to unaware to state d/t decreased awareness of deficits, pt's husband states he would like for pt to improve her speech, memory and ability to participate in ADLs and iADLs  SUBJECTIVE STATEMENT: Pt joking in the lobby about leaving her husband behind (he wasn't in the lobby with her), she also demonstrated conversation about upcoming weather and having soup at home simmering Pt accompanied by: alone, her husband stayed in the waiting area   OBJECTIVE:   TODAY'S TREATMENT:  Skilled treatment session targeted pt's cognitive communication goals. SLP facilitated session by providing the following interventions:  Much improved social interaction and great vocal intensity, no cues required to increased speech intelligibility.   Pt independently completed holiday garland, great task initiation, attention to task, problem solving as well as initiation and feedback on physical location to place garland in rehab gym. Excellent session and great progress.    PATIENT EDUCATION: Education details: results of this assessment, ST POC Person educated: Patient and Spouse Education method: Explanation Education comprehension: needs further education   HOME EXERCISE PROGRAM:   Increase participation in social activities such as church   GOALS:  Goals reviewed with patient? Yes  SHORT TERM GOALS: Target date: 10 sessions  With maximal cues, pt will demonstrate safe swallow strategies when consuming pills in 5 weeks.  Baseline: Goal status: INITIAL  2.  With moderate cues, pt will complete a basic multi-step task (e.g., making a sandwich) with  superivision within 5 weeks.  Baseline:  Goal status: INITIAL  3.  With moderate cues, pt will improve sustained attention to basic tasks for 10 minutes.  Baseline:  Goal status: INITIAL  4.  With maximal cues, pt will sustain phonation for 5-7 seconds at 65 dB in 8 out of 10 opportunities across 4 sessions.  Baseline:  Goal status: INITIAL  5. With maximal cues, pt will initiate commenting on a basic topic 1 times during a structured activity in 5 out of 7 opportunities.  Baseline:   Goal Status: INITIAL   LONG TERM GOALS: Target date: 09/24/2024  With maximal cues, pt will initiate asking a question or basic topic 1 times during a structured activity in 5 out of 7 opportunities. Baseline:  Goal status: INITIAL  2.  With maximal cues, pt will produce one word utterances at 65 dB in 8 out of 10 opportunities across 5 sessions.  Baseline:  Goal status: INITIAL  3.  With moderate cues, pt will sustain attention to basic task for 15 minutes in 3 out of 4 sessions.  Baseline:  Goal status: INITIAL  ASSESSMENT:  CLINICAL IMPRESSION: Patient is a 74 y.o. female who was seen today for a cognitive communication treatment d/t acute left basal ganglia stroke.Pt presents with greatly improving cognitive communication abilities.  Pt continues with good response to therapy activities. She also reports that she went to church this Sunday and enjoyed it. See the above treatment note  for details.    OBJECTIVE IMPAIRMENTS include attention, memory, awareness, executive functioning, expressive language, dysarthria, and dysphagia. These impairments are limiting patient from managing medications, managing appointments, managing finances, household responsibilities, ADLs/IADLs, effectively communicating at home and in community, and safety when swallowing. Factors affecting potential to achieve goals and functional outcome are ability to learn/carryover information, co-morbidities, medical  prognosis, previous level of function, severity of impairments, and family/community support. Patient will benefit from skilled SLP services to address above impairments and improve overall function.  REHAB POTENTIAL: Good  PLAN: SLP FREQUENCY: 1-2x/week  SLP DURATION: 12 weeks  PLANNED INTERVENTIONS: Aspiration precaution training, Diet toleration management , Language facilitation, Environmental controls, Trials of upgraded texture/liquids, Cueing hierachy, Cognitive reorganization, Internal/external aids, Functional tasks, Multimodal communication approach, SLP instruction and feedback, Compensatory strategies, and Patient/family education   Eloyse Causey B. Rubbie, M.S., CCC-SLP, CBIS Speech-Language Pathologist Certified Brain Injury Specialist [facility]  [facility] [phone] Ascom [phone] Fax [phone]  "

## 2024-08-11 ENCOUNTER — Other Ambulatory Visit: Payer: Self-pay

## 2024-08-11 ENCOUNTER — Ambulatory Visit

## 2024-08-11 ENCOUNTER — Observation Stay (HOSPITAL_BASED_OUTPATIENT_CLINIC_OR_DEPARTMENT_OTHER)
Admission: EM | Admit: 2024-08-11 | Discharge: 2024-08-13 | Disposition: A | Discharge status: Home / Self Care | Source: Home / Self Care | Attending: Emergency Medicine | Admitting: Emergency Medicine

## 2024-08-11 ENCOUNTER — Emergency Department

## 2024-08-11 ENCOUNTER — Ambulatory Visit: Admitting: Occupational Therapy

## 2024-08-11 ENCOUNTER — Observation Stay

## 2024-08-11 ENCOUNTER — Ambulatory Visit: Admitting: Speech Pathology

## 2024-08-11 DIAGNOSIS — R29702 NIHSS score 2: Secondary | ICD-10-CM

## 2024-08-11 DIAGNOSIS — Z79899 Other long term (current) drug therapy: Secondary | ICD-10-CM | POA: Insufficient documentation

## 2024-08-11 DIAGNOSIS — E782 Mixed hyperlipidemia: Secondary | ICD-10-CM | POA: Insufficient documentation

## 2024-08-11 DIAGNOSIS — N3946 Mixed incontinence: Secondary | ICD-10-CM | POA: Diagnosis present

## 2024-08-11 DIAGNOSIS — D110 Benign neoplasm of parotid gland: Secondary | ICD-10-CM | POA: Insufficient documentation

## 2024-08-11 DIAGNOSIS — Z7982 Long term (current) use of aspirin: Secondary | ICD-10-CM | POA: Insufficient documentation

## 2024-08-11 DIAGNOSIS — Z6835 Body mass index (BMI) 35.0-35.9, adult: Secondary | ICD-10-CM | POA: Insufficient documentation

## 2024-08-11 DIAGNOSIS — I639 Cerebral infarction, unspecified: Secondary | ICD-10-CM | POA: Insufficient documentation

## 2024-08-11 DIAGNOSIS — R41841 Cognitive communication deficit: Secondary | ICD-10-CM

## 2024-08-11 DIAGNOSIS — I6340 Cerebral infarction due to embolism of unspecified cerebral artery: Secondary | ICD-10-CM | POA: Diagnosis not present

## 2024-08-11 DIAGNOSIS — R4701 Aphasia: Principal | ICD-10-CM

## 2024-08-11 DIAGNOSIS — R2990 Unspecified symptoms and signs involving the nervous system: Secondary | ICD-10-CM

## 2024-08-11 DIAGNOSIS — E66812 Obesity, class 2: Secondary | ICD-10-CM | POA: Insufficient documentation

## 2024-08-11 DIAGNOSIS — I10 Essential (primary) hypertension: Secondary | ICD-10-CM | POA: Insufficient documentation

## 2024-08-11 DIAGNOSIS — E66811 Obesity, class 1: Secondary | ICD-10-CM | POA: Diagnosis present

## 2024-08-11 HISTORY — DX: Cerebral infarction, unspecified: I63.9

## 2024-08-11 LAB — CBC
HCT: 33.2 % — ABNORMAL LOW (ref 36.0–46.0)
Hemoglobin: 11 g/dL — ABNORMAL LOW (ref 12.0–15.0)
MCH: 27.3 pg (ref 26.0–34.0)
MCHC: 33.1 g/dL (ref 30.0–36.0)
MCV: 82.4 fL (ref 80.0–100.0)
Platelets: 254 K/uL (ref 150–400)
RBC: 4.03 MIL/uL (ref 3.87–5.11)
RDW: 14.6 % (ref 11.5–15.5)
WBC: 9.8 K/uL (ref 4.0–10.5)
nRBC: 0 % (ref 0.0–0.2)

## 2024-08-11 LAB — DIFFERENTIAL
Abs Immature Granulocytes: 0.03 K/uL (ref 0.00–0.07)
Basophils Absolute: 0.1 K/uL (ref 0.0–0.1)
Basophils Relative: 1 %
Eosinophils Absolute: 0.4 K/uL (ref 0.0–0.5)
Eosinophils Relative: 4 %
Immature Granulocytes: 0 %
Lymphocytes Relative: 23 %
Lymphs Abs: 2.2 K/uL (ref 0.7–4.0)
Monocytes Absolute: 0.8 K/uL (ref 0.1–1.0)
Monocytes Relative: 9 %
Neutro Abs: 6.3 K/uL (ref 1.7–7.7)
Neutrophils Relative %: 63 %

## 2024-08-11 LAB — PROTIME-INR
INR: 1 (ref 0.8–1.2)
Prothrombin Time: 14.1 s (ref 11.4–15.2)

## 2024-08-11 LAB — CBG MONITORING, ED: Glucose-Capillary: 92 mg/dL (ref 70–99)

## 2024-08-11 LAB — URINALYSIS, ROUTINE W REFLEX MICROSCOPIC
Bilirubin Urine: NEGATIVE
Glucose, UA: NEGATIVE mg/dL
Hgb urine dipstick: NEGATIVE
Ketones, ur: NEGATIVE mg/dL
Leukocytes,Ua: NEGATIVE
Nitrite: NEGATIVE
Protein, ur: NEGATIVE mg/dL
Specific Gravity, Urine: 1.02 (ref 1.005–1.030)
pH: 7 (ref 5.0–8.0)

## 2024-08-11 LAB — COMPREHENSIVE METABOLIC PANEL WITH GFR
ALT: 16 U/L (ref 0–44)
AST: 18 U/L (ref 15–41)
Albumin: 4.4 g/dL (ref 3.5–5.0)
Alkaline Phosphatase: 112 U/L (ref 38–126)
Anion gap: 11 (ref 5–15)
BUN: 12 mg/dL (ref 8–23)
CO2: 23 mmol/L (ref 22–32)
Calcium: 9.7 mg/dL (ref 8.9–10.3)
Chloride: 103 mmol/L (ref 98–111)
Creatinine, Ser: 0.72 mg/dL (ref 0.44–1.00)
GFR, Estimated: >60 mL/min
Glucose, Bld: 95 mg/dL (ref 70–99)
Potassium: 4 mmol/L (ref 3.5–5.1)
Sodium: 138 mmol/L (ref 135–145)
Total Bilirubin: 0.9 mg/dL (ref 0.0–1.2)
Total Protein: 7.7 g/dL (ref 6.5–8.1)

## 2024-08-11 LAB — APTT: aPTT: 28 s (ref 24–36)

## 2024-08-11 LAB — MAGNESIUM: Magnesium: 2.2 mg/dL (ref 1.7–2.4)

## 2024-08-11 MED ORDER — IOHEXOL 350 MG/ML SOLN
75.0000 mL | Freq: Once | INTRAVENOUS | Status: AC | PRN
  Administered 2024-08-11: 75 mL via INTRAVENOUS

## 2024-08-11 MED ORDER — HEPARIN SODIUM (PORCINE) 5000 UNIT/ML IJ SOLN
5000.0000 [IU] | Freq: Three times a day (TID) | INTRAMUSCULAR | Status: DC
  Administered 2024-08-11 – 2024-08-13 (×5): 5000 [IU] via SUBCUTANEOUS
  Filled 2024-08-11 (×5): qty 1

## 2024-08-11 MED ORDER — STROKE: EARLY STAGES OF RECOVERY BOOK: Freq: Once | Status: AC

## 2024-08-11 MED ORDER — ONDANSETRON HCL 4 MG/2ML IJ SOLN: 4.0000 mg | Freq: Four times a day (QID) | INTRAMUSCULAR | Status: DC | PRN

## 2024-08-11 MED ORDER — ONDANSETRON HCL 4 MG PO TABS: 4.0000 mg | ORAL_TABLET | Freq: Four times a day (QID) | ORAL | Status: DC | PRN

## 2024-08-11 MED ORDER — SENNOSIDES-DOCUSATE SODIUM 8.6-50 MG PO TABS: 1.0000 | ORAL_TABLET | Freq: Every evening | ORAL | Status: DC | PRN

## 2024-08-11 MED ORDER — ASPIRIN 325 MG PO TABS
325.0000 mg | ORAL_TABLET | Freq: Once | ORAL | Status: DC
  Filled 2024-08-11: qty 1

## 2024-08-11 MED ORDER — ACETAMINOPHEN 650 MG RE SUPP: 650.0000 mg | Freq: Four times a day (QID) | RECTAL | Status: DC | PRN

## 2024-08-11 MED ORDER — SODIUM CHLORIDE 0.9% FLUSH
3.0000 mL | Freq: Two times a day (BID) | INTRAVENOUS | Status: DC
  Administered 2024-08-11 – 2024-08-12 (×3): 3 mL via INTRAVENOUS

## 2024-08-11 MED ORDER — LACTATED RINGERS IV SOLN: INTRAVENOUS | Status: DC

## 2024-08-11 MED ORDER — CLOPIDOGREL BISULFATE 75 MG PO TABS
300.0000 mg | ORAL_TABLET | Freq: Once | ORAL | Status: AC
  Administered 2024-08-11: 300 mg via ORAL
  Filled 2024-08-11: qty 4

## 2024-08-11 MED ORDER — ATORVASTATIN CALCIUM 20 MG PO TABS: 80.0000 mg | ORAL_TABLET | Freq: Every day | ORAL | Status: DC

## 2024-08-11 MED ORDER — ASPIRIN 300 MG RE SUPP: 300.0000 mg | Freq: Once | RECTAL | Status: DC

## 2024-08-11 MED ORDER — ACETAMINOPHEN 325 MG PO TABS: 650.0000 mg | ORAL_TABLET | Freq: Four times a day (QID) | ORAL | Status: DC | PRN

## 2024-08-11 NOTE — Progress Notes (Signed)
 Attempted bedside swallow evaluation with patient upon admission to unit. Pt sitting up with head of bead at 90 degrees. Attempted meds with sips of water  & attempted meds crushed with applesauce. Pt unable to tolerate swallowing without aspiration & pocketing. Resulted in fail for study, speech eval. placed.

## 2024-08-11 NOTE — ED Notes (Signed)
 Patient was fully changed and all lenin changed at this time. RN unabel to gain UA sample before patient went to the restroom.

## 2024-08-11 NOTE — Plan of Care (Signed)
" °  Problem: Education: Goal: Knowledge of disease or condition will improve Outcome: Progressing   Problem: Ischemic Stroke/TIA Tissue Perfusion: Goal: Complications of ischemic stroke/TIA will be minimized Outcome: Progressing   Problem: Coping: Goal: Will verbalize positive feelings about self Outcome: Progressing   Problem: Self-Care: Goal: Ability to participate in self-care as condition permits will improve Outcome: Progressing   Problem: Nutrition: Goal: Risk of aspiration will decrease Outcome: Progressing   Problem: Activity: Goal: Risk for activity intolerance will decrease Outcome: Progressing   Problem: Nutrition: Goal: Adequate nutrition will be maintained Outcome: Progressing   Problem: Pain Managment: Goal: General experience of comfort will improve and/or be controlled Outcome: Progressing   Problem: Safety: Goal: Ability to remain free from injury will improve Outcome: Progressing   "

## 2024-08-11 NOTE — Progress Notes (Signed)
 SPIRITUAL CARE AND COUNSELING CONSULT NOTE   VISIT SUMMARY  Husband, [name], at bedside.   SPIRITUAL ENCOUNTER                                                                                                                                                                      Type of Visit: Initial Care provided to:: Pt and family Conversation partners present during encounter: Nurse, Physician Referral source: Code page Reason for visit: Code (Rapid response/code stroke to speech therapy) OnCall Visit: Yes   SPIRITUAL FRAMEWORK      GOALS       INTERVENTIONS   Spiritual Care Interventions Made: Established relationship of care and support, Compassionate presence, Reflective listening    INTERVENTION OUTCOMES   Outcomes: Connection to spiritual care, Awareness of support  SPIRITUAL CARE PLAN   Spiritual Care Issues Still Outstanding: No further spiritual care needs at this time (see row info)    If immediate needs arise, please contact [facility] 24 hour on call [phone]   Sari Hugh, Chaplain  08/11/2024 2:48 PM

## 2024-08-11 NOTE — ED Provider Notes (Signed)
 "  [facility] Provider Note    Event Date/Time   First MD Initiated Contact with Patient 08/11/24 1440     (approximate)  History   Chief Complaint: Code Stroke  HPI  Abigail Malone is a 74 y.o. female with a past medical history of anemia, hypertension, CVA in September who presents to the emergency department for acute onset of difficulty speaking/slowed speaking as well as generalized weakness.  According to report patient was at speech therapy today when she acutely began experiencing trouble speaking which they described more as a slowed speech.  Patient is able to answer questions appropriately but takes a prolonged amount of time to answer them.  She is also describing a tingling and weakness sensation in her bilateral upper extremities.  Husband states patient had been improving well at home, and these changes appear to be acute compared to the patient's more recent baseline.  Patient made a code stroke on arrival.  Neurology in the room with myself evaluating.  Physical Exam   Triage Vital Signs: ED Triage Vitals  Encounter Vitals Group     BP 08/11/24 1448 (!) 161/68     Girls Systolic BP Percentile --      Girls Diastolic BP Percentile --      Boys Systolic BP Percentile --      Boys Diastolic BP Percentile --      Pulse Rate 08/11/24 1445 71     Resp 08/11/24 1445 19     Temp --      Temp src --      SpO2 08/11/24 1445 100 %     Weight 08/11/24 1448 176 lb 1.6 oz (79.9 kg)     Height 08/11/24 1448 4' 11 (1.499 m)     Head Circumference --      Peak Flow --      Pain Score --      Pain Loc --      Pain Education --      Exclude from Growth Chart --     Most recent vital signs: Vitals:   08/11/24 1445 08/11/24 1448  BP:  (!) 161/68  Pulse: 71   Resp: 19   SpO2: 100%     General: Awake, no distress.  Answers questions appropriately but with slowed responses. CV:  Good peripheral perfusion.  Regular rate and rhythm  Resp:  Normal  effort.  Equal breath sounds bilaterally.  Abd:  No distention.  Soft, nontender.  No rebound or guarding.  ED Results / Procedures / Treatments   EKG  EKG viewed and interpreted by myself shows a normal sinus rhythm 82 bpm with a narrow QRS, normal axis, normal intervals, no concerning ST changes.  RADIOLOGY  I have reviewed the CT images of the head.  No obvious bleed seen on my evaluation.  MEDICATIONS ORDERED IN ED: Medications - No data to display   IMPRESSION / MDM / ASSESSMENT AND PLAN / ED COURSE  I reviewed the triage vital signs and the nursing notes.  Patient's presentation is most consistent with acute presentation with potential threat to life or bodily function.  Patient presents to the emergency department from speech therapy for decreased/slowed responses as well as bilateral tingling of the upper extremities.  Patient has been seen by myself as well as neurology.  Good strength in extremities.  No other deficits identified.  Patient's labs have resulted showing a normal CBC.  CBG is normal at 92.  Remainder  of the lab work radiology read of the CT scan and EKG are pending.  Neurology has ordered a CTA of the head and the neck.  If the CTAs are negative and rule out large vessel occlusion patient will likely require admission to the hospital service for further stroke workup including an MRI.  Lab work is reassuring with a reassuring CBC reassuring chemistry and normal urinalysis.  CTA on my evaluation does not appear to show any significant finding however awaiting radiology read.  If the CTA is negative patient will require admission for further stroke workup per neurology's recommendations.  Patient care signed out to oncoming provider CTA pending.  FINAL CLINICAL IMPRESSION(S) / ED DIAGNOSES   CVA   Note:  This document was prepared using Dragon voice recognition software and may include unintentional dictation errors.   Dorothyann Drivers, MD 08/15/24 1402  "

## 2024-08-11 NOTE — Consult Note (Signed)
 Requesting Physician: Paduchowski    Chief Complaint: Tingling in hands and delayed responses  I have been asked by Dr. Dorothyann to see this patient in consultation for code stroke.  HPI: Abigail Malone is an 74 y.o. female with a history of thalamic infarct in September of 2025 on ASA and HTN who was at speech therapy today and had acute onset of dizziness.  Then noted that both hands were tingling.  Responses were noted to be appropriate but delayed.  Code stroke initiated.    Date last known well: 08/11/2024 Time last known well: 1400 tNK Given: No: Non disabling symptoms Thrombectomy candidate: No, no target lesion identified mRankin 1  Past Medical History:  Diagnosis Date   Abnormal uterine bleeding (AUB) 05/15/2016   Allergic rhinitis, seasonal 12/09/2014   Anemia, unspecified 11/18/2013   Atypical ductal hyperplasia of right breast 09/19/2021   Cardiac syncope 05/18/2019   HTN (hypertension) 11/18/2013   Hypertension    Menopausal symptoms 08/05/2014   Right knee pain 07/26/2015   Stroke [facility])     Past Surgical History:  Procedure Laterality Date   BREAST BIOPSY Right 09/07/2021   stereo calcs, ribbon clip, path pending.   BREAST BIOPSY WITH RADIO FREQUENCY LOCALIZER Right 10/18/2021   Procedure: BREAST BIOPSY WITH RADIO FREQUENCY LOCALIZER;  Surgeon: Lane Shope, MD;  Location: [facility] ORS;  Service: General;  Laterality: Right;   BREAST EXCISIONAL BIOPSY Right 2023   COLONOSCOPY  ~2018    Family History  Problem Relation Age of Onset   Cancer Mother        Liver   Stroke Mother    Hypertension Mother    Hematuria Mother    Liver cancer Mother    Cancer Father    Prostate cancer Father    Kidney failure Brother    Breast cancer Neg Hx    Social History:  reports that she quit smoking about 17 years ago. Her smoking use included cigarettes. She started smoking about 25 years ago. She has a 4 pack-year smoking history. She has never been exposed to  tobacco smoke. She has never used smokeless tobacco. She reports that she does not currently use alcohol. She reports that she does not use drugs.  Allergies: Allergies[1]  Medications:  Prior to Admission medications  Medication Sig Start Date End Date Taking? Authorizing Provider  acetaminophen  (TYLENOL ) 500 MG tablet Take 650 mg by mouth every 6 (six) hours as needed for moderate pain (pain score 4-6).   Yes [provider]  amantadine (SYMMETREL) 50 MG/5ML solution Take 50 mg by mouth 2 (two) times daily.   Yes [provider]  aspirin  81 MG chewable tablet Chew by mouth daily.   Yes [provider]  atorvastatin  (LIPITOR) 80 MG tablet Take 80 mg by mouth. 05/05/24 06/03/25 Yes [provider]  carvedilol  (COREG ) 12.5 MG tablet Take 12.5 mg by mouth 2 (two) times daily.   Yes [provider]  NIFEdipine  (PROCARDIA  XL/NIFEDICAL XL) 60 MG 24 hr tablet Take 60 mg by mouth. 06/12/24 06/07/25 Yes [provider]  spironolactone  (ALDACTONE ) 25 MG tablet Take 1 tablet (25 mg total) by mouth daily. 06/30/24  Yes Manya Toribio SQUIBB, PA  valsartan  (DIOVAN ) 160 MG tablet Take 1 tablet (160 mg total) by mouth daily. 06/29/24  Yes Manya Toribio SQUIBB, PA  furosemide (LASIX) 20 MG tablet Take 20 mg by mouth. Patient not taking: Reported on 08/11/2024 07/02/24   [provider]     ROS:  History obtained from the patient  General ROS: negative for - chills, fatigue, fever, night sweats, weight gain or weight loss Psychological ROS: negative for - behavioral disorder, hallucinations, memory difficulties, mood swings or suicidal ideation Ophthalmic ROS: negative for - blurry vision, double vision, eye pain or loss of vision ENT ROS: negative for - epistaxis, nasal discharge, oral lesions, sore throat, tinnitus or vertigo Allergy and Immunology ROS: negative for - hives or itchy/watery eyes Hematological and Lymphatic ROS: negative for - bleeding  problems, bruising or swollen lymph nodes Endocrine ROS: negative for - galactorrhea, hair pattern changes, polydipsia/polyuria or temperature intolerance Respiratory ROS: negative for - cough, hemoptysis, shortness of breath or wheezing Cardiovascular ROS: negative for - chest pain, dyspnea on exertion, edema or irregular heartbeat Gastrointestinal ROS: negative for - abdominal pain, diarrhea, hematemesis, nausea/vomiting or stool incontinence Genito-Urinary ROS: negative for - dysuria, hematuria, incontinence or urinary frequency/urgency Musculoskeletal ROS: right leg pain Neurological ROS: as noted in HPI Dermatological ROS: negative for rash and skin lesion changes   Physical Examination: Blood pressure 103/63, pulse 71, temperature 97.8 F (36.6 C), temperature source Oral, resp. rate 19, height 4' 11 (1.499 m), weight 79.9 kg, SpO2 100%.  NIHSS components Score: Comment  1a Level of Conscious 0[x]  1[]  2[]  3[]      1b LOC Questions 0[]  1[x]  2[]       1c LOC Commands 0[x]  1[]  2[]       2 Best Gaze 0[x]  1[]  2[]       3 Visual 0[x]  1[]  2[]  3[]      4 Facial Palsy 0[x]  1[]  2[]  3[]      5a Motor Arm - left 0[x]  1[]  2[]  3[]  4[]  UN[]    5b Motor Arm - Right 0[x]  1[]  2[]  3[]  4[]  UN[]    6a Motor Leg - Left 0[x]  1[]  2[]  3[]  4[]  UN[]    6b Motor Leg - Right 0[x]  1[]  2[]  3[]  4[]  UN[]    7 Limb Ataxia 0[x]  1[]  2[]  3[]  UN[]     8 Sensory 0[]  1[x]  2[]  UN[]    Both hands  9 Best Language 0[x]  1[]  2[]  3[]      10 Dysarthria 0[x]  1[]  2[]  UN[]      11 Extinct. and Inattention 0[x]  1[]  2[]       TOTAL: 2      Laboratory Studies:  Basic Metabolic Panel: Recent Labs  Lab 08/11/24 1442  NA 138  K 4.0  CL 103  CO2 23  GLUCOSE 95  BUN 12  CREATININE 0.72  CALCIUM  9.7    Liver Function Tests: Recent Labs  Lab 08/11/24 1442  AST 18  ALT 16  ALKPHOS 112  BILITOT 0.9  PROT 7.7  ALBUMIN 4.4   No results for input(s): LIPASE, AMYLASE in the last 168 hours. No results for input(s): AMMONIA  in the last 168 hours.  CBC: Recent Labs  Lab 08/11/24 1442  WBC 9.8  NEUTROABS 6.3  HGB 11.0*  HCT 33.2*  MCV 82.4  PLT 254    Cardiac Enzymes: No results for input(s): CKTOTAL, CKMB, CKMBINDEX, TROPONINI in the last 168 hours.  BNP: Invalid input(s): POCBNP  CBG: Recent Labs  Lab 08/11/24 1440  GLUCAP 92    Microbiology: Results for orders placed or performed in visit on 09/30/23  Microscopic Examination     Status: None   Collection Time: 09/30/23  2:23 PM   Urine  Result Value Ref Range Status   WBC, UA 0-5 0 - 5 /hpf Final   RBC, Urine 0-2 0 - 2 /hpf  Final   Epithelial Cells (non renal) 0-10 0 - 10 /hpf Final   Bacteria, UA Few None seen/Few Final  CULTURE, URINE COMPREHENSIVE     Status: None   Collection Time: 09/30/23  2:58 PM   Specimen: Urine   UR  Result Value Ref Range Status   Urine Culture, Comprehensive Final report  Final   Organism ID, Bacteria Comment  Final    Comment: Mixed urogenital flora Less than 10,000 colonies/mL     Coagulation Studies: Recent Labs    08/11/24 1442  LABPROT 14.1  INR 1.0    Urinalysis: No results for input(s): COLORURINE, LABSPEC, PHURINE, GLUCOSEU, HGBUR, BILIRUBINUR, KETONESUR, PROTEINUR, UROBILINOGEN, NITRITE, LEUKOCYTESUR in the last 168 hours.  Invalid input(s): APPERANCEUR  Lipid Panel:    Component Value Date/Time   CHOL 112 06/29/2024 1549   TRIG 75 06/29/2024 1549   HDL 52 06/29/2024 1549   CHOLHDL 2.2 06/29/2024 1549   LDLCALC 45 06/29/2024 1549    HgbA1C: No results found for: HGBA1C  Urine Drug Screen:  No results found for: LABOPIA, COCAINSCRNUR, LABBENZ, AMPHETMU, THCU, LABBARB  Alcohol Level: No results for input(s): ETH in the last 168 hours.  Imaging: CT ANGIO HEAD NECK W WO CM (CODE STROKE) Result Date: 08/11/2024 EXAM: CTA HEAD AND NECK WITH AND WITHOUT 08/11/2024 03:04:26 PM TECHNIQUE: CTA of the head and neck was performed  with and without the administration of 75 mL iohexol  (OMNIPAQUE ) 350 MG/ML injection. Multiplanar 2D and/or 3D reformatted images are provided for review. Automated exposure control, iterative reconstruction, and/or weight based adjustment of the mA/kV was utilized to reduce the radiation dose to as low as reasonably achievable. Stenosis of the internal carotid arteries measured using NASCET criteria. COMPARISON: Same day CT head CLINICAL HISTORY: Neuro deficit, acute, stroke suspected. FINDINGS: CTA NECK: AORTIC ARCH AND ARCH VESSELS: Mild atherosclerosis of the aortic arch. No dissection or arterial injury. No significant stenosis of the brachiocephalic or subclavian arteries. CERVICAL CAROTID ARTERIES: Mild tortuosity of the proximal right common carotid artery. There is mild atherosclerosis at the right carotid bifurcation without hemodynamically significant stenosis. Mild atherosclerosis at the left carotid bifurcation without hemodynamically significant stenosis. No dissection or arterial injury. CERVICAL VERTEBRAL ARTERIES: The right vertebral artery is dominant. The non-dominant left vertebral artery terminates at the origin of the left PICA. No dissection, arterial injury, or significant stenosis. LUNGS AND MEDIASTINUM: Unremarkable. SOFT TISSUES: There is a heterogeneous enhancing lesion in the left parotid gland primarily involving the deep lobe which measures 2.4 x 1.1 x 1.4 cm (series 4, image 77 and series 8, image 100). Recommend contrast enhanced MRI of the neck for further evaluation of this lesion. There is a mildly prominent left periparotid/cervical lymph node measuring up to 0.8 cm in short axis. Subcentimeter thyroid  nodules. BONES: Degenerative changes in the visualized spine. Trace degenerative anterolisthesis of C4 on C5. CTA HEAD: ANTERIOR CIRCULATION: Atherosclerosis involving the carotid siphons with mild stenosis of the bilateral cavernous ICAs. There is a 3 mm inferiorly directed  outpouching along the left supraclinoid ICA concerning for a posterior communicating artery aneurysm (series 6, image 1100). The ACAs are patent bilaterally. There is moderate stenosis of the distal A2 segment of the left ACA. The MCAs are patent bilaterally. There is atherosclerotic irregularity and mild stenosis of multiple M2 branches of the bilateral MCAs. POSTERIOR CIRCULATION: Fetal origin of the right PCA. There is focal moderate stenosis of the P2 segment of the left PCA. There is irregularity and mild stenosis of  the proximal basilar artery. No significant stenosis of the intracranial vertebral arteries. No aneurysm. OTHER: No dural venous sinus thrombosis on this non-dedicated study. IMPRESSION: 1. No acute large vessel occlusion. 2. Intracranial atherosclerotic disease with mild stenosis of the bilateral cavernous ICAs, multiple M2 branches, and proximal basilar artery, with moderate stenosis of the distal left A2 segment and focal moderate stenosis of the left P2 segment. 3. 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 4. Heterogeneous enhancing lesion in the left parotid gland centered in the deep lobe measuring 2.4 x 1.1 x 1.4 cm. Recommend contrast-enhanced MRI of the neck for further evaluation. Electronically signed by: Donnice Mania MD 08/11/2024 03:34 PM EST RP Workstation: [id number]   CT HEAD CODE STROKE WO CONTRAST (LKW 0-4.5h, LVO 0-24h) Result Date: 08/11/2024 EXAM: CT HEAD WITHOUT CONTRAST 08/11/2024 02:49:18 PM TECHNIQUE: CT of the head was performed without the administration of intravenous contrast. Automated exposure control, iterative reconstruction, and/or weight based adjustment of the mA/kV was utilized to reduce the radiation dose to as low as reasonably achievable. COMPARISON: None available. CLINICAL HISTORY: Neuro deficit, acute, stroke suspected. FINDINGS: BRAIN AND VENTRICLES: There is no  evidence of an acute infarct, intracranial hemorrhage, mass, midline shift, hydrocephalus, or extra-axial fluid collection. Chronic infarcts are present in the basal ganglia regions bilaterally, right larger than left with associated ex vacuo dilatation of the frontal horns of the lateral ventricles, and there is also a small chronic left occipital infarct. Hypodensities elsewhere in the cerebral white matter bilaterally are nonspecific but compatible with mild chronic small vessel ischemic disease. Calcified atherosclerosis at the skull base. ORBITS: No acute abnormality. SINUSES: No acute abnormality. SOFT TISSUES AND SKULL: No acute soft tissue abnormality. No skull fracture. [place] Stroke Program Early CT Score (ASPECTS) ----- Ganglionic (caudate, IC, lentiform nucleus, insula, M1-M3): 7 Supraganglionic (M4-M6): 3 Total: 10 IMPRESSION: 1. No acute intracranial abnormality. ASPECTS of 10. 2. Chronic bilateral basal ganglia and left occipital infarcts. 3. These results were communicated to Dr. FREDRIK Hock at 2:54 pm on 08/11/2024 by secure text page via the [facility] messaging system. Electronically signed by: Dasie Hamburg MD 08/11/2024 02:55 PM EST RP Workstation: [id number]    Assessment: 74 y.o. female with a history of thalamic infarct in September of 2025 on ASA and HTN who was at speech therapy today and had acute onset of dizziness.  Then noted that both hands were tingling.  Responses were noted to be appropriate but delayed.  NIHSS of 2.  Head CT personally reviewed and shows no acute changes.  CTA of the head and neck personally reviewed and shows no evidence of LVO.  Although atypical presentation for acute infarct, can not rule out a shower of emboli.  Current presentation without significant disabling symptoms but further work up warranted.    Stroke Risk Factors - hypertension  Plan: 1. ASA 325mg  and Plavix  300mg  now.   2. MRI of the brain without contrast.  If evidence of acute infarct would  pursue stroke w/u.  If no evidence of acute infarct would pursue work up for metabolic cause.   3. NPO until RN stroke swallow screen 4. Telemetry monitoring 5. Frequent neuro checks  Case discussed with Dr. Fernand Sonny Hock, MD Neurology  08/11/2024, 4:01 PM          [1]  Allergies Allergen Reactions   Amlodipine     Leg cramping

## 2024-08-11 NOTE — H&P (Signed)
 " History and Physical    Abigail Malone FMW:[id number] DOB: 1950/12/26 DOA: 08/11/2024  DOS: the patient was seen and examined on 08/11/2024  PCP: Abigail Toribio SQUIBB, PA   Patient coming from: Home  I have personally briefly reviewed patient's old medical records in [facility] and CareEverywhere  HPI:   Abigail Malone is a 74 y.o. year old female with medical history of hypertension, hyperlipidemia, class II obesity, history of CVA in 03/2024 presented to the ED after having strokelike symptoms while working with speech therapy.  Her complaints were multiple including dizziness, tingling in her hands and delayed responses.  Code stroke was initiated and patient underwent CT head without any acute findings and CTA without any acute findings.  Neurology concern for embolic shower as cause of stroke so advised MRI and admission for further workup.  Lab work overall reassuring.  Urinalysis pending along with lipid panel and A1c.  Given need for further care, [facility] contacted for admission. On my evaluation, husband is at bedside.  Patient states her dizziness has resolved she still having some tingling in her left hand.  She is slightly slow to respond but husband is at bedside who states this has been her baseline since the last stroke.  He states she chokes on pills but does fine with water  and food.   Review of Systems: As mentioned in the history of present illness. All other systems reviewed and are negative.   Past Medical History:  Diagnosis Date   Abnormal uterine bleeding (AUB) 05/15/2016   Allergic rhinitis, seasonal 12/09/2014   Anemia, unspecified 11/18/2013   Atypical ductal hyperplasia of right breast 09/19/2021   Cardiac syncope 05/18/2019   HTN (hypertension) 11/18/2013   Hypertension    Menopausal symptoms 08/05/2014   Right knee pain 07/26/2015   Stroke [facility])     Past Surgical History:  Procedure Laterality Date   BREAST BIOPSY Right 09/07/2021   stereo calcs,  ribbon clip, path pending.   BREAST BIOPSY WITH RADIO FREQUENCY LOCALIZER Right 10/18/2021   Procedure: BREAST BIOPSY WITH RADIO FREQUENCY LOCALIZER;  Surgeon: Lane Shope, MD;  Location: [facility] ORS;  Service: General;  Laterality: Right;   BREAST EXCISIONAL BIOPSY Right 2023   COLONOSCOPY  ~2018     Allergies[1]  Family History  Problem Relation Age of Onset   Cancer Mother        Liver   Stroke Mother    Hypertension Mother    Hematuria Mother    Liver cancer Mother    Cancer Father    Prostate cancer Father    Kidney failure Brother    Breast cancer Neg Hx     Prior to Admission medications  Medication Sig Start Date End Date Taking? Authorizing Provider  acetaminophen  (TYLENOL ) 500 MG tablet Take 650 mg by mouth every 6 (six) hours as needed for moderate pain (pain score 4-6).   Yes [provider]  amantadine (SYMMETREL) 50 MG/5ML solution Take 50 mg by mouth 2 (two) times daily.   Yes [provider]  aspirin  81 MG chewable tablet Chew by mouth daily.   Yes [provider]  atorvastatin  (LIPITOR) 80 MG tablet Take 80 mg by mouth. 05/05/24 06/03/25 Yes [provider]  carvedilol  (COREG ) 12.5 MG tablet Take 12.5 mg by mouth 2 (two) times daily.   Yes [provider]  NIFEdipine  (PROCARDIA  XL/NIFEDICAL XL) 60 MG 24 hr tablet Take 60 mg by mouth. 06/12/24 06/07/25 Yes [provider]  spironolactone  (ALDACTONE ) 25 MG tablet Take 1 tablet (25 mg total) by mouth daily. 06/30/24  Yes Abigail Toribio SQUIBB, PA  valsartan  (DIOVAN ) 160 MG tablet Take 1 tablet (160 mg total) by mouth daily. 06/29/24  Yes Abigail Toribio SQUIBB, PA  furosemide (LASIX) 20 MG tablet Take 20 mg by mouth. Patient not taking: Reported on 08/11/2024 07/02/24   [provider]    Social History:  reports that she quit smoking about 17 years ago. Her smoking use included cigarettes. She started smoking about 25 years ago. She has a 4 pack-year smoking  history. She has never been exposed to tobacco smoke. She has never used smokeless tobacco. She reports that she does not currently use alcohol. She reports that she does not use drugs.    Physical Exam: Vitals:   08/11/24 1500 08/11/24 1506 08/11/24 1722 08/11/24 2125  BP: 103/63  (!) 139/53 (!) 133/58  Pulse:   66 76  Resp:   18 18  Temp:  97.8 F (36.6 C) 98.4 F (36.9 C) 98.3 F (36.8 C)  TempSrc:  Oral    SpO2:   100% 98%  Weight:      Height:        Gen: NAD HENT: NCAT CV: normal heart sounds Lung: CTAB Abd: No TTP, normal bowel sounds MSK: No asymmetry, good bulk and tone Neuro: alert and oriented x 4, CN II-XII intact.  Light and deep touch intact bilaterally.  Strength 5 out of 5 upper and lower extremities.  Finger-nose testing normal.   Labs on Admission: I have personally reviewed following labs and imaging studies  CBC: Recent Labs  Lab 08/11/24 1442  WBC 9.8  NEUTROABS 6.3  HGB 11.0*  HCT 33.2*  MCV 82.4  PLT 254   Basic Metabolic Panel: Recent Labs  Lab 08/11/24 1442  NA 138  K 4.0  CL 103  CO2 23  GLUCOSE 95  BUN 12  CREATININE 0.72  CALCIUM  9.7  MG 2.2   GFR: Estimated Creatinine Clearance: 57.2 mL/min (by C-G formula based on SCr of 0.72 mg/dL). Liver Function Tests: Recent Labs  Lab 08/11/24 1442  AST 18  ALT 16  ALKPHOS 112  BILITOT 0.9  PROT 7.7  ALBUMIN 4.4   No results for input(s): LIPASE, AMYLASE in the last 168 hours. No results for input(s): AMMONIA in the last 168 hours. Coagulation Profile: Recent Labs  Lab 08/11/24 1442  INR 1.0   Cardiac Enzymes: No results for input(s): CKTOTAL, CKMB, CKMBINDEX, TROPONINI, TROPONINIHS in the last 168 hours. BNP (last 3 results) No results for input(s): BNP in the last 8760 hours. HbA1C: No results for input(s): HGBA1C in the last 72 hours. CBG: Recent Labs  Lab 08/11/24 1440  GLUCAP 92   Lipid Profile: No results for input(s): CHOL, HDL,  LDLCALC, TRIG, CHOLHDL, LDLDIRECT in the last 72 hours. Thyroid  Function Tests: No results for input(s): TSH, T4TOTAL, FREET4, T3FREE, THYROIDAB in the last 72 hours. Anemia Panel: No results for input(s): VITAMINB12, FOLATE, FERRITIN, TIBC, IRON, RETICCTPCT in the last 72 hours. Urine analysis:    Component Value Date/Time   COLORURINE COLORLESS (A) 08/11/2024 1710   APPEARANCEUR CLEAR (A) 08/11/2024 1710   APPEARANCEUR Clear 09/30/2023 1423   LABSPEC 1.020 08/11/2024 1710   PHURINE 7.0 08/11/2024 1710   GLUCOSEU NEGATIVE 08/11/2024 1710   HGBUR NEGATIVE 08/11/2024 1710   BILIRUBINUR NEGATIVE 08/11/2024 1710   BILIRUBINUR Negative 09/30/2023 1423   KETONESUR NEGATIVE 08/11/2024 1710   PROTEINUR NEGATIVE  08/11/2024 1710   UROBILINOGEN 0.2 10/20/2021 1631   NITRITE NEGATIVE 08/11/2024 1710   LEUKOCYTESUR NEGATIVE 08/11/2024 1710    Radiological Exams on Admission: I have personally reviewed images MR BRAIN WO CONTRAST Result Date: 08/11/2024 EXAM: MRI BRAIN WITHOUT CONTRAST 08/11/2024 07:42:21 PM TECHNIQUE: Multiplanar multisequence MRI of the head/brain was performed without the administration of intravenous contrast. COMPARISON: CT head earlier today. CLINICAL HISTORY: Neuro deficit, acute, stroke suspected FINDINGS: BRAIN AND VENTRICLES: Mild restricted diffusion in the left basal ganglia and corona radiata, suggestive of subacute infarct. emote right basal ganglia and left PCA territory infarcts. Additional T2 hyperintensities in the white matter, compatible with chronic microvascular ischemic disease. No intracranial hemorrhage. No mass. No midline shift. No hydrocephalus. The sella is unremarkable. Normal flow voids. ORBITS: No significant abnormality. SINUSES AND MASTOIDS: No significant abnormality. BONES AND SOFT TISSUES: Normal marrow signal. Approximately 2.3 cm T2 hyperintense mass in the deep aspect of the left parotid. IMPRESSION: 1. Findings  suggestive of subacute perforator infarct in the left basal ganglia and corona radiata. 2. Remote right basal ganglia and left PCA territory infarcts and chronic microvascular ischemic change. 3. 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 Recommend ENT consultation for management. Electronically signed by: Glendia Molt MD 08/11/2024 08:19 PM EST RP Workstation: [id number]   CT ANGIO HEAD NECK W WO CM (CODE STROKE) Result Date: 08/11/2024 EXAM: CTA HEAD AND NECK WITH AND WITHOUT 08/11/2024 03:04:26 PM TECHNIQUE: CTA of the head and neck was performed with and without the administration of 75 mL iohexol  (OMNIPAQUE ) 350 MG/ML injection. Multiplanar 2D and/or 3D reformatted images are provided for review. Automated exposure control, iterative reconstruction, and/or weight based adjustment of the mA/kV was utilized to reduce the radiation dose to as low as reasonably achievable. Stenosis of the internal carotid arteries measured using NASCET criteria. COMPARISON: Same day CT head CLINICAL HISTORY: Neuro deficit, acute, stroke suspected. FINDINGS: CTA NECK: AORTIC ARCH AND ARCH VESSELS: Mild atherosclerosis of the aortic arch. No dissection or arterial injury. No significant stenosis of the brachiocephalic or subclavian arteries. CERVICAL CAROTID ARTERIES: Mild tortuosity of the proximal right common carotid artery. There is mild atherosclerosis at the right carotid bifurcation without hemodynamically significant stenosis. Mild atherosclerosis at the left carotid bifurcation without hemodynamically significant stenosis. No dissection or arterial injury. CERVICAL VERTEBRAL ARTERIES: The right vertebral artery is dominant. The non-dominant left vertebral artery terminates at the origin of the left PICA. No dissection, arterial injury, or significant stenosis. LUNGS AND MEDIASTINUM: Unremarkable. SOFT TISSUES: There is a heterogeneous enhancing lesion in the  left parotid gland primarily involving the deep lobe which measures 2.4 x 1.1 x 1.4 cm (series 4, image 77 and series 8, image 100). Recommend contrast enhanced MRI of the neck for further evaluation of this lesion. There is a mildly prominent left periparotid/cervical lymph node measuring up to 0.8 cm in short axis. Subcentimeter thyroid  nodules. BONES: Degenerative changes in the visualized spine. Trace degenerative anterolisthesis of C4 on C5. CTA HEAD: ANTERIOR CIRCULATION: Atherosclerosis involving the carotid siphons with mild stenosis of the bilateral cavernous ICAs. There is a 3 mm inferiorly directed outpouching along the left supraclinoid ICA concerning for a posterior communicating artery aneurysm (series 6, image 1100). The ACAs are patent bilaterally. There is moderate stenosis of the distal A2 segment of the left ACA. The MCAs are patent bilaterally. There is atherosclerotic irregularity and mild stenosis of multiple M2 branches of the bilateral MCAs. POSTERIOR  CIRCULATION: Fetal origin of the right PCA. There is focal moderate stenosis of the P2 segment of the left PCA. There is irregularity and mild stenosis of the proximal basilar artery. No significant stenosis of the intracranial vertebral arteries. No aneurysm. OTHER: No dural venous sinus thrombosis on this non-dedicated study. IMPRESSION: 1. No acute large vessel occlusion. 2. Intracranial atherosclerotic disease with mild stenosis of the bilateral cavernous ICAs, multiple M2 branches, and proximal basilar artery, with moderate stenosis of the distal left A2 segment and focal moderate stenosis of the left P2 segment. 3. 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 4. Heterogeneous enhancing lesion in the left parotid gland centered in the deep lobe measuring 2.4 x 1.1 x 1.4 cm. Recommend contrast-enhanced MRI of the neck for further evaluation. Electronically  signed by: Donnice Mania MD 08/11/2024 03:34 PM EST RP Workstation: [id number]   CT HEAD CODE STROKE WO CONTRAST (LKW 0-4.5h, LVO 0-24h) Result Date: 08/11/2024 EXAM: CT HEAD WITHOUT CONTRAST 08/11/2024 02:49:18 PM TECHNIQUE: CT of the head was performed without the administration of intravenous contrast. Automated exposure control, iterative reconstruction, and/or weight based adjustment of the mA/kV was utilized to reduce the radiation dose to as low as reasonably achievable. COMPARISON: None available. CLINICAL HISTORY: Neuro deficit, acute, stroke suspected. FINDINGS: BRAIN AND VENTRICLES: There is no evidence of an acute infarct, intracranial hemorrhage, mass, midline shift, hydrocephalus, or extra-axial fluid collection. Chronic infarcts are present in the basal ganglia regions bilaterally, right larger than left with associated ex vacuo dilatation of the frontal horns of the lateral ventricles, and there is also a small chronic left occipital infarct. Hypodensities elsewhere in the cerebral white matter bilaterally are nonspecific but compatible with mild chronic small vessel ischemic disease. Calcified atherosclerosis at the skull base. ORBITS: No acute abnormality. SINUSES: No acute abnormality. SOFT TISSUES AND SKULL: No acute soft tissue abnormality. No skull fracture. [place] Stroke Program Early CT Score (ASPECTS) ----- Ganglionic (caudate, IC, lentiform nucleus, insula, M1-M3): 7 Supraganglionic (M4-M6): 3 Total: 10 IMPRESSION: 1. No acute intracranial abnormality. ASPECTS of 10. 2. Chronic bilateral basal ganglia and left occipital infarcts. 3. These results were communicated to Dr. FREDRIK Hock at 2:54 pm on 08/11/2024 by secure text page via the [facility] messaging system. Electronically signed by: Dasie Hamburg MD 08/11/2024 02:55 PM EST RP Workstation: [id number]    EKG: My personal interpretation of EKG shows: Sinus rhythm without any acute ST changes    Assessment/Plan Principal Problem:    Cerebral infarction [facility]) Active Problems:   HTN (hypertension)   Mixed stress and urge urinary incontinence   Hyperlipidemia, mixed   Obesity, Class II, BMI 35-39.9   Cerebral Infarction Pt with symptoms of dizziness and tingling found to have a stroke.  Patient did not have LVO so not a candidate for acute intervention.  Neurology consulted, appreciate their recommendations.  MRI resulted after neurology evaluation and showed subacute perforator infarct in the left basal ganglia and corona radiata.  CTA was without stroke or LVO.  It showed heterogenous enhancing lesion in the left parotid gland that was previously present on care everywhere.  (See below).  RN messaged me stating patient passed bedside swallow, so we will allow critical medications with applesauce.  Will hold diet until SLP evaluation. - Allow for permissive HTN in the setting of stroke, given subacute nature this can be normalized tomorrow.  Her blood pressure currently is normotensive. - ASA 81 mg daily and Plavix  75  mg for 21 days followed by ASA alone - High Intensity Statin - Echocardiogram  - A1C  - Lipid panel  - Tele monitoring  - SLP eval - PT/OT  3 mm posterior communicating aneurysm: Likely needs good blood pressure control and very unlikely intervention needed on this.  Will defer to neurology for further recommendations.  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  Surgery was recommended but deferred given her recent stroke.  She is to have repeat scan in 4 to 6 months and follow-up with ENT.    Class II obesity: Discussed lifestyle modifications.  Will benefit from weight loss with improvement of blood pressure and cholesterol along with overall health.  VTE prophylaxis:  SQ Heparin   Diet: N.p.o. meds crushed with apple sauce Code Status:  Full Code Telemetry:  Admission status: Observation, Telemetry bed Patient is from:  Home Anticipated d/c is to: Home Anticipated d/c is in: 1-2 days   Family Communication: Updated at bedside  Consults called: Neurology   Severity of Illness: The appropriate patient status for this patient is OBSERVATION. Observation status is judged to be reasonable and necessary in order to provide the required intensity of service to ensure the patient's safety. The patient's presenting symptoms, physical exam findings, and initial radiographic and laboratory data in the context of their medical condition is felt to place them at decreased risk for further clinical deterioration. Furthermore, it is anticipated that the patient will be medically stable for discharge from the hospital within 2 midnights of admission.    Morene Bathe, MD [facility] DEL. [facility]      [1]  Allergies Allergen Reactions   Amlodipine     Leg cramping   "

## 2024-08-11 NOTE — Progress Notes (Signed)
 CODE STROKE- PHARMACY COMMUNICATION   Time CODE STROKE called/page received:[id number]  Time response to CODE STROKE was made (in person or via phone):   Time Stroke Kit retrieved from Pyxis (only if needed):n/a  Name of Provider/Nurse contacted:n/a  Past Medical History:  Diagnosis Date   Abnormal uterine bleeding (AUB) 05/15/2016   Allergic rhinitis, seasonal 12/09/2014   Anemia, unspecified 11/18/2013   Atypical ductal hyperplasia of right breast 09/19/2021   Cardiac syncope 05/18/2019   HTN (hypertension) 11/18/2013   Hypertension    Menopausal symptoms 08/05/2014   Right knee pain 07/26/2015   Stroke [facility])    Prior to Admission medications  Medication Sig Start Date End Date Taking? Authorizing Provider  acetaminophen  (TYLENOL ) 500 MG tablet Take 650 mg by mouth every 6 (six) hours as needed for moderate pain (pain score 4-6).    [provider]  amantadine (SYMMETREL) 50 MG/5ML solution Take 50 mg by mouth 2 (two) times daily.    [provider]  aspirin  81 MG chewable tablet Chew by mouth daily.    [provider]  atorvastatin  (LIPITOR) 80 MG tablet Take 80 mg by mouth. 05/05/24 06/03/25  [provider]  carvedilol  (COREG ) 12.5 MG tablet Take 12.5 mg by mouth 2 (two) times daily.    [provider]  furosemide (LASIX) 20 MG tablet Take 20 mg by mouth. 07/02/24   [provider]  NIFEdipine  (PROCARDIA  XL/NIFEDICAL XL) 60 MG 24 hr tablet Take 60 mg by mouth. 06/12/24 06/07/25  [provider]  spironolactone  (ALDACTONE ) 25 MG tablet Take 1 tablet (25 mg total) by mouth daily. 06/30/24   Manya Toribio SQUIBB, PA  valsartan  (DIOVAN ) 160 MG tablet Take 1 tablet (160 mg total) by mouth daily. 06/29/24   Manya Toribio SQUIBB, PA    Bari Glendia BIRCH ,PharmD Clinical Pharmacist  08/11/2024  3:24 PM

## 2024-08-11 NOTE — Therapy (Signed)
 Pt arrived to therapy, appeared in normal health (1400), joking with this clinical research associate and her husband in the reception area. While walking down hallway to ST office, pt stated that she had become dizzy with mild right foot drag observed. Upon arriving to ST office, pt's BP taken (141/79). Water  offered, pt reported continued dizziness and new headache. Observed absently looking around the office and very delayed responses to this clinical research associate. She then began looking at both of her hands, reported seeing stars on her hands.   Code Stroke called by this clinical research associate, team arrived with pt taken to ED for further evaluation.   Edd Reppert B. Rubbie, M.S., CCC-SLP, Tree Surgeon Certified Brain Injury Specialist [facility]  [facility] Office [phone] Ascom [phone] Fax [phone]

## 2024-08-11 NOTE — ED Provider Notes (Signed)
" °  Physical Exam  BP 103/63   Pulse 71   Temp 97.8 F (36.6 C) (Oral)   Resp 19   Ht 4' 11 (1.499 m)   Wt 79.9 kg   SpO2 100%   BMI 35.57 kg/m   Physical Exam  Procedures  Procedures  ED Course / MDM    Medical Decision Making Amount and/or Complexity of Data Reviewed Labs: ordered. Radiology: ordered.  Risk Decision regarding hospitalization.   This patient's care was signed out to me at shift change pending completion of CTA.  Patient initially presented to the emergency department for slowed speech and tingling in bilateral hands.  She was made a stroke alert upon arrival and was evaluated by neurology who recommended CTAs to rule out a large vessel occlusion and admission for further stroke workup.  CTA did not show any evidence of large vessel occlusion.  I discussed the patient's case with the hospitalist who is in agreement with admission at this time.       Rexford Reche HERO, MD 08/11/24 [phone]  "

## 2024-08-12 ENCOUNTER — Encounter: Payer: Self-pay | Admitting: Internal Medicine

## 2024-08-12 ENCOUNTER — Observation Stay: Admit: 2024-08-12 | Discharge: 2024-08-12 | Disposition: A | Attending: Internal Medicine

## 2024-08-12 DIAGNOSIS — I6389 Other cerebral infarction: Secondary | ICD-10-CM

## 2024-08-12 DIAGNOSIS — G9389 Other specified disorders of brain: Secondary | ICD-10-CM | POA: Diagnosis not present

## 2024-08-12 DIAGNOSIS — G459 Transient cerebral ischemic attack, unspecified: Secondary | ICD-10-CM | POA: Diagnosis not present

## 2024-08-12 DIAGNOSIS — I959 Hypotension, unspecified: Secondary | ICD-10-CM

## 2024-08-12 DIAGNOSIS — I639 Cerebral infarction, unspecified: Secondary | ICD-10-CM | POA: Diagnosis not present

## 2024-08-12 LAB — ECHOCARDIOGRAM COMPLETE BUBBLE STUDY
AR max vel: 3.46 cm2
AV Area VTI: 3.72 cm2
AV Area mean vel: 3.51 cm2
AV Mean grad: 3 mmHg
AV Peak grad: 5.8 mmHg
Ao pk vel: 1.2 m/s
Area-P 1/2: 3.93 cm2
MV VTI: 3.59 cm2
S' Lateral: 2.7 cm

## 2024-08-12 LAB — CBC
HCT: 30.2 % — ABNORMAL LOW (ref 36.0–46.0)
Hemoglobin: 10.4 g/dL — ABNORMAL LOW (ref 12.0–15.0)
MCH: 27.7 pg (ref 26.0–34.0)
MCHC: 34.4 g/dL (ref 30.0–36.0)
MCV: 80.3 fL (ref 80.0–100.0)
Platelets: 267 K/uL (ref 150–400)
RBC: 3.76 MIL/uL — ABNORMAL LOW (ref 3.87–5.11)
RDW: 14.4 % (ref 11.5–15.5)
WBC: 9.6 K/uL (ref 4.0–10.5)
nRBC: 0 % (ref 0.0–0.2)

## 2024-08-12 LAB — GLUCOSE, CAPILLARY: Glucose-Capillary: 106 mg/dL — ABNORMAL HIGH (ref 70–99)

## 2024-08-12 LAB — HEMOGLOBIN A1C
Hgb A1c MFr Bld: 6.1 % — ABNORMAL HIGH (ref 4.8–5.6)
Mean Plasma Glucose: 128.37 mg/dL

## 2024-08-12 LAB — LIPID PANEL
Cholesterol: 101 mg/dL (ref 0–200)
HDL: 42 mg/dL
LDL Cholesterol: 43 mg/dL (ref 0–99)
Total CHOL/HDL Ratio: 2.4 ratio
Triglycerides: 78 mg/dL
VLDL: 16 mg/dL (ref 0–40)

## 2024-08-12 LAB — BASIC METABOLIC PANEL WITH GFR
Anion gap: 9 (ref 5–15)
BUN: 9 mg/dL (ref 8–23)
CO2: 23 mmol/L (ref 22–32)
Calcium: 9.2 mg/dL (ref 8.9–10.3)
Chloride: 107 mmol/L (ref 98–111)
Creatinine, Ser: 0.68 mg/dL (ref 0.44–1.00)
GFR, Estimated: >60 mL/min
Glucose, Bld: 110 mg/dL — ABNORMAL HIGH (ref 70–99)
Potassium: 3.7 mmol/L (ref 3.5–5.1)
Sodium: 140 mmol/L (ref 135–145)

## 2024-08-12 MED ORDER — SPIRONOLACTONE 25 MG PO TABS
25.0000 mg | ORAL_TABLET | Freq: Every day | ORAL | Status: DC
  Administered 2024-08-12 – 2024-08-13 (×2): 25 mg via ORAL
  Filled 2024-08-12 (×2): qty 1

## 2024-08-12 MED ORDER — NIFEDIPINE ER 60 MG PO TB24: 60.0000 mg | ORAL_TABLET | Freq: Every day | ORAL | Status: DC

## 2024-08-12 MED ORDER — ATORVASTATIN CALCIUM 20 MG PO TABS
80.0000 mg | ORAL_TABLET | Freq: Every day | ORAL | Status: DC
  Administered 2024-08-12 – 2024-08-13 (×2): 80 mg via ORAL
  Filled 2024-08-12 (×2): qty 4

## 2024-08-12 MED ORDER — ASPIRIN 81 MG PO CHEW
81.0000 mg | CHEWABLE_TABLET | Freq: Every day | ORAL | Status: DC
  Administered 2024-08-12 – 2024-08-13 (×2): 81 mg via ORAL
  Filled 2024-08-12 (×2): qty 1

## 2024-08-12 MED ORDER — ASPIRIN 81 MG PO CHEW: 81.0000 mg | CHEWABLE_TABLET | Freq: Every day | ORAL | Status: DC

## 2024-08-12 MED ORDER — CARVEDILOL 6.25 MG PO TABS
6.2500 mg | ORAL_TABLET | Freq: Two times a day (BID) | ORAL | Status: DC
  Administered 2024-08-12 – 2024-08-13 (×3): 6.25 mg via ORAL
  Filled 2024-08-12 (×3): qty 1

## 2024-08-12 MED ORDER — IRBESARTAN 150 MG PO TABS
150.0000 mg | ORAL_TABLET | Freq: Every day | ORAL | Status: DC
  Administered 2024-08-12 – 2024-08-13 (×2): 150 mg via ORAL
  Filled 2024-08-12 (×2): qty 1

## 2024-08-12 MED ORDER — CLOPIDOGREL BISULFATE 75 MG PO TABS
75.0000 mg | ORAL_TABLET | Freq: Every day | ORAL | Status: DC
  Administered 2024-08-12 – 2024-08-13 (×2): 75 mg via ORAL
  Filled 2024-08-12 (×2): qty 1

## 2024-08-12 NOTE — Progress Notes (Signed)
 [facility]  - [place] at [facility]   PATIENT NAME: Abigail Malone    MR#:  [id number]  DATE OF BIRTH:  Oct 23, 1950  SUBJECTIVE:  husband at bedside. Patient overall doing well. Husband says she is improved. Blood pressure is stable now resuming BP meds after adjustment to avoid hypotension. Discussed importance of keeping log of blood pressure at home with husband. Patient ambulates independently with physical therapy. Tolerating PO diet. No new component    VITALS:  Blood pressure (!) 157/68, pulse 66, temperature 98.3 F (36.8 C), temperature source Oral, resp. rate 17, height 4' 11 (1.499 m), weight 79.9 kg, SpO2 95%.  PHYSICAL EXAMINATION:   GENERAL:  74 y.o.-year-old patient with no acute distress.  LUNGS: Normal breath sounds bilaterally CARDIOVASCULAR: S1, S2 normal. No murmur   ABDOMEN: Soft, nontender, nondistended.  EXTREMITIES: No  edema b/l.    NEUROLOGIC: nonfocal  patient is alert and awake  LABORATORY PANEL:  CBC Recent Labs  Lab 08/12/24 0529  WBC 9.6  HGB 10.4*  HCT 30.2*  PLT 267    Chemistries  Recent Labs  Lab 08/11/24 1442 08/12/24 0529  NA 138 140  K 4.0 3.7  CL 103 107  CO2 23 23  GLUCOSE 95 110*  BUN 12 9  CREATININE 0.72 0.68  CALCIUM  9.7 9.2  MG 2.2  --   AST 18  --   ALT 16  --   ALKPHOS 112  --   BILITOT 0.9  --    Cardiac Enzymes No results for input(s): TROPONINI in the last 168 hours. RADIOLOGY:  ECHOCARDIOGRAM COMPLETE BUBBLE STUDY Result Date: 08/12/2024    ECHOCARDIOGRAM REPORT   Patient Name:   Abigail Malone Date of Exam: 08/12/2024 Medical Rec #:  [id number]        Height:       59.0 in Accession #:    [id number]       Weight:       176.1 lb Date of Birth:  07/21/1951        BSA:          1.747 m Patient Age:    73 years         BP:           127/99 mmHg Patient Gender: F                HR:           71 bpm. Exam Location:  [facility] Procedure: 2D Echo, Cardiac Doppler, Color Doppler and Saline Contrast  Bubble            Study (Both Spectral and Color Flow Doppler were utilized during            procedure). Indications:     Stroke 434.91 / I63.9  History:         Patient has no prior history of Echocardiogram examinations.                  Stroke; Risk Factors:Hypertension.  Sonographer:     [name] Furnace Referring Phys:  [id number] [facility] Diagnosing Phys: Evalene Lunger MD IMPRESSIONS  1. Left ventricular ejection fraction, by estimation, is 60 to 65%. The left ventricle has normal function. The left ventricle has no regional wall motion abnormalities. Left ventricular diastolic parameters are consistent with Grade I diastolic dysfunction (impaired relaxation).  2. Right ventricular systolic function is normal. The right ventricular size is normal. There is mildly elevated pulmonary artery  systolic pressure. The estimated right ventricular systolic pressure is 37.3 mmHg.  3. The mitral valve is normal in structure. Mild mitral valve regurgitation. No evidence of mitral stenosis.  4. The aortic valve is tricuspid. Aortic valve regurgitation is not visualized. No aortic stenosis is present.  5. The inferior vena cava is normal in size with greater than 50% respiratory variability, suggesting right atrial pressure of 3 mmHg. FINDINGS  Left Ventricle: Left ventricular ejection fraction, by estimation, is 60 to 65%. The left ventricle has normal function. The left ventricle has no regional wall motion abnormalities. Strain was performed and the global longitudinal strain is indeterminate. The left ventricular internal cavity size was normal in size. There is no left ventricular hypertrophy. Left ventricular diastolic parameters are consistent with Grade I diastolic dysfunction (impaired relaxation). Right Ventricle: The right ventricular size is normal. No increase in right ventricular wall thickness. Right ventricular systolic function is normal. There is mildly elevated pulmonary artery systolic pressure. The  tricuspid regurgitant velocity is 2.84  m/s, and with an assumed right atrial pressure of 5 mmHg, the estimated right ventricular systolic pressure is 37.3 mmHg. Left Atrium: Left atrial size was normal in size. Right Atrium: Right atrial size was normal in size. Pericardium: There is no evidence of pericardial effusion. Mitral Valve: The mitral valve is normal in structure. Mild mitral valve regurgitation. No evidence of mitral valve stenosis. MV peak gradient, 4.8 mmHg. The mean mitral valve gradient is 2.0 mmHg. Tricuspid Valve: The tricuspid valve is normal in structure. Tricuspid valve regurgitation is mild . No evidence of tricuspid stenosis. Aortic Valve: The aortic valve is tricuspid. Aortic valve regurgitation is not visualized. No aortic stenosis is present. Aortic valve mean gradient measures 3.0 mmHg. Aortic valve peak gradient measures 5.8 mmHg. Aortic valve area, by VTI measures 3.72 cm. Pulmonic Valve: The pulmonic valve was normal in structure. Pulmonic valve regurgitation is not visualized. No evidence of pulmonic stenosis. Aorta: The aortic root is normal in size and structure. Venous: The inferior vena cava is normal in size with greater than 50% respiratory variability, suggesting right atrial pressure of 3 mmHg. IAS/Shunts: No atrial level shunt detected by color flow Doppler. Agitated saline contrast was given intravenously to evaluate for intracardiac shunting. Additional Comments: 3D was performed not requiring image post processing on an independent workstation and was indeterminate.  LEFT VENTRICLE PLAX 2D LVIDd:         4.70 cm   Diastology LVIDs:         2.70 cm   LV e' medial:    8.05 cm/s LV PW:         1.10 cm   LV E/e' medial:  11.1 LV IVS:        1.30 cm   LV e' lateral:   7.07 cm/s LVOT diam:     2.00 cm   LV E/e' lateral: 12.6 LV SV:         105 LV SV Index:   60 LVOT Area:     3.14 cm LV IVRT:       103 msec  RIGHT VENTRICLE RV Basal diam:  2.70 cm RV Mid diam:    2.40 cm RV S  prime:     10.80 cm/s LEFT ATRIUM             Index        RIGHT ATRIUM           Index LA diam:  2.70 cm 1.55 cm/m   RA Area:     14.50 cm LA Vol (A2C):   37.2 ml 21.29 ml/m  RA Volume:   34.00 ml  19.46 ml/m LA Vol (A4C):   38.3 ml 21.92 ml/m LA Biplane Vol: 37.4 ml 21.41 ml/m  AORTIC VALVE AV Area (Vmax):    3.46 cm AV Area (Vmean):   3.51 cm AV Area (VTI):     3.72 cm AV Vmax:           120.00 cm/s AV Vmean:          85.000 cm/s AV VTI:            0.281 m AV Peak Grad:      5.8 mmHg AV Mean Grad:      3.0 mmHg LVOT Vmax:         132.00 cm/s LVOT Vmean:        94.900 cm/s LVOT VTI:          0.333 m LVOT/AV VTI ratio: 1.19  AORTA Ao Root diam: 3.10 cm MITRAL VALVE                TRICUSPID VALVE MV Area (PHT): 3.93 cm     TR Peak grad:   32.3 mmHg MV Area VTI:   3.59 cm     TR Vmax:        284.00 cm/s MV Peak grad:  4.8 mmHg MV Mean grad:  2.0 mmHg     SHUNTS MV Vmax:       1.09 m/s     Systemic VTI:  0.33 m MV Vmean:      69.2 cm/s    Systemic Diam: 2.00 cm MV Decel Time: 193 msec MV E velocity: 89.10 cm/s MV A velocity: 102.00 cm/s MV E/A ratio:  0.87 Evalene Lunger MD Electronically signed by Evalene Lunger MD Signature Date/Time: 08/12/2024/3:57:55 PM    Final    MR BRAIN WO CONTRAST Result Date: 08/11/2024 EXAM: MRI BRAIN WITHOUT CONTRAST 08/11/2024 07:42:21 PM TECHNIQUE: Multiplanar multisequence MRI of the head/brain was performed without the administration of intravenous contrast. COMPARISON: CT head earlier today. CLINICAL HISTORY: Neuro deficit, acute, stroke suspected FINDINGS: BRAIN AND VENTRICLES: Mild restricted diffusion in the left basal ganglia and corona radiata, suggestive of subacute infarct. emote right basal ganglia and left PCA territory infarcts. Additional T2 hyperintensities in the white matter, compatible with chronic microvascular ischemic disease. No intracranial hemorrhage. No mass. No midline shift. No hydrocephalus. The sella is unremarkable. Normal flow voids.  ORBITS: No significant abnormality. SINUSES AND MASTOIDS: No significant abnormality. BONES AND SOFT TISSUES: Normal marrow signal. Approximately 2.3 cm T2 hyperintense mass in the deep aspect of the left parotid. IMPRESSION: 1. Findings suggestive of subacute perforator infarct in the left basal ganglia and corona radiata. 2. Remote right basal ganglia and left PCA territory infarcts and chronic microvascular ischemic change. 3. 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 Recommend ENT consultation for management. Electronically signed by: Glendia Molt MD 08/11/2024 08:19 PM EST RP Workstation: [id number]   CT ANGIO HEAD NECK W WO CM (CODE STROKE) Result Date: 08/11/2024 EXAM: CTA HEAD AND NECK WITH AND WITHOUT 08/11/2024 03:04:26 PM TECHNIQUE: CTA of the head and neck was performed with and without the administration of 75 mL iohexol  (OMNIPAQUE ) 350 MG/ML injection. Multiplanar 2D and/or 3D reformatted images are provided for review. Automated exposure control, iterative reconstruction, and/or weight based adjustment of  the mA/kV was utilized to reduce the radiation dose to as low as reasonably achievable. Stenosis of the internal carotid arteries measured using NASCET criteria. COMPARISON: Same day CT head CLINICAL HISTORY: Neuro deficit, acute, stroke suspected. FINDINGS: CTA NECK: AORTIC ARCH AND ARCH VESSELS: Mild atherosclerosis of the aortic arch. No dissection or arterial injury. No significant stenosis of the brachiocephalic or subclavian arteries. CERVICAL CAROTID ARTERIES: Mild tortuosity of the proximal right common carotid artery. There is mild atherosclerosis at the right carotid bifurcation without hemodynamically significant stenosis. Mild atherosclerosis at the left carotid bifurcation without hemodynamically significant stenosis. No dissection or arterial injury. CERVICAL VERTEBRAL ARTERIES: The right vertebral artery is dominant.  The non-dominant left vertebral artery terminates at the origin of the left PICA. No dissection, arterial injury, or significant stenosis. LUNGS AND MEDIASTINUM: Unremarkable. SOFT TISSUES: There is a heterogeneous enhancing lesion in the left parotid gland primarily involving the deep lobe which measures 2.4 x 1.1 x 1.4 cm (series 4, image 77 and series 8, image 100). Recommend contrast enhanced MRI of the neck for further evaluation of this lesion. There is a mildly prominent left periparotid/cervical lymph node measuring up to 0.8 cm in short axis. Subcentimeter thyroid  nodules. BONES: Degenerative changes in the visualized spine. Trace degenerative anterolisthesis of C4 on C5. CTA HEAD: ANTERIOR CIRCULATION: Atherosclerosis involving the carotid siphons with mild stenosis of the bilateral cavernous ICAs. There is a 3 mm inferiorly directed outpouching along the left supraclinoid ICA concerning for a posterior communicating artery aneurysm (series 6, image 1100). The ACAs are patent bilaterally. There is moderate stenosis of the distal A2 segment of the left ACA. The MCAs are patent bilaterally. There is atherosclerotic irregularity and mild stenosis of multiple M2 branches of the bilateral MCAs. POSTERIOR CIRCULATION: Fetal origin of the right PCA. There is focal moderate stenosis of the P2 segment of the left PCA. There is irregularity and mild stenosis of the proximal basilar artery. No significant stenosis of the intracranial vertebral arteries. No aneurysm. OTHER: No dural venous sinus thrombosis on this non-dedicated study. IMPRESSION: 1. No acute large vessel occlusion. 2. Intracranial atherosclerotic disease with mild stenosis of the bilateral cavernous ICAs, multiple M2 branches, and proximal basilar artery, with moderate stenosis of the distal left A2 segment and focal moderate stenosis of the left P2 segment. 3. 3 mm inferiorly directed outpouching along the left supraclinoid ICA concerning for a  posterior communicating artery aneurysm. 4. Heterogeneous enhancing lesion in the left parotid gland centered in the deep lobe measuring 2.4 x 1.1 x 1.4 cm. Recommend contrast-enhanced MRI of the neck for further evaluation. Electronically signed by: Donnice Mania MD 08/11/2024 03:34 PM EST RP Workstation: [id number]   CT HEAD CODE STROKE WO CONTRAST (LKW 0-4.5h, LVO 0-24h) Result Date: 08/11/2024 EXAM: CT HEAD WITHOUT CONTRAST 08/11/2024 02:49:18 PM TECHNIQUE: CT of the head was performed without the administration of intravenous contrast. Automated exposure control, iterative reconstruction, and/or weight based adjustment of the mA/kV was utilized to reduce the radiation dose to as low as reasonably achievable. COMPARISON: None available. CLINICAL HISTORY: Neuro deficit, acute, stroke suspected. FINDINGS: BRAIN AND VENTRICLES: There is no evidence of an acute infarct, intracranial hemorrhage, mass, midline shift, hydrocephalus, or extra-axial fluid collection. Chronic infarcts are present in the basal ganglia regions bilaterally, right larger than left with associated ex vacuo dilatation of the frontal horns of the lateral ventricles, and there is also a small chronic left occipital infarct. Hypodensities elsewhere in the cerebral white matter bilaterally are nonspecific but  compatible with mild chronic small vessel ischemic disease. Calcified atherosclerosis at the skull base. ORBITS: No acute abnormality. SINUSES: No acute abnormality. SOFT TISSUES AND SKULL: No acute soft tissue abnormality. No skull fracture. [place] Stroke Program Early CT Score (ASPECTS) ----- Ganglionic (caudate, IC, lentiform nucleus, insula, M1-M3): 7 Supraganglionic (M4-M6): 3 Total: 10 IMPRESSION: 1. No acute intracranial abnormality. ASPECTS of 10. 2. Chronic bilateral basal ganglia and left occipital infarcts. 3. These results were communicated to Dr. FREDRIK Hock at 2:54 pm on 08/11/2024 by secure text page via the [facility] messaging  system. Electronically signed by: Dasie Hamburg MD 08/11/2024 02:55 PM EST RP Workstation: [id number]    Assessment and Plan  From H&P SUZI HERNAN is a 74 y.o. year old female with medical history of hypertension, hyperlipidemia, class II obesity, history of CVA in 03/2024 presented to the ED after having strokelike symptoms while working with speech therapy.  Her complaints were multiple including dizziness, tingling in her hands and delayed responses.  Code stroke was initiated and patient underwent CT head without any acute findings and CTA without any acute findings.  Neurology concern for embolic shower as cause of stroke so advised MRI and admission for further workup.  Lab work overall reassuring.    MRI brain:  Findings suggestive of subacute perforator infarct in the left basal ganglia and corona radiata. 2. Remote right basal ganglia and left PCA territory infarcts and chronic microvascular ischemic change. 3. 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 Recommend ENT consultation for management.   Dizziness/Possible stroke/ Low bp transiently --  Patient did not have LVO so not a candidate for acute intervention.  -- Neurology consulted, appreciate their recommendations.   --MRI resulted after neurology evaluation and showed subacute perforator infarct in the left basal ganglia and corona radiata.  --per Neurology similar to last MRI  0/03/2024 --CTA was without stroke or LVO.  It showed heterogenous enhancing lesion in the left parotid gland that was previously present on care everywhere.  (See below).  - Allow for permissive HTN in the setting of stroke, given subacute nature this can be normalized tomorrow.  Her blood pressure currently is normotensive. - ASA 81 mg daily and Plavix  75 mg for 21 days followed by plavix   alone - High Intensity Statin -- ambulated well with physical therapy. Continue outpatient  therapy. -- Adjusting BP meds to ensure no sudden drops in blood pressure.  History of hypertension transient low blood pressure at admission --d/w Neurology resumed Coreg  6.25 BID (was on 12.5 mg bid), Spironolactone ,valsartan  every day    3 mm posterior communicating aneurysm: Likely needs good blood pressure control and very unlikely intervention needed on this.  Will defer to neurology for further recommendations.   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  Surgery was recommended but deferred given her recent stroke.  She is to have repeat scan in 4 to 6 months and follow-up with ENT.   -- Patient follows at [facility] per husband   Class II obesity: Discussed lifestyle modifications.  Will benefit from weight loss with improvement of blood pressure and cholesterol along with overall health.     Procedures: Family communication : husband Consults : neurology CODE STATUS: full DVT Prophylaxis : heparin  Level of care: Telemetry Status is: Observation The patient remains OBS appropriate and will d/c before 2 midnights.    TOTAL TIME TAKING  CARE OF THIS PATIENT: 40 minutes.  >50% time spent on counselling and coordination of care  Note: This dictation was prepared with Dragon dictation along with smaller phrase technology. Any transcriptional errors that result from this process are unintentional.  Leita Blanch M.D    [facility]   CC: Primary care physician; Manya Toribio SQUIBB, PA

## 2024-08-12 NOTE — TOC Initial Note (Signed)
 Transition of Care [facility]) - Initial/Assessment Note    Patient Details  Name: Abigail Malone MRN: [id number] Date of Birth: Jul 07, 1951  [facility] CM/SW Contact:    Nathanael CHRISTELLA Ring, RN Phone Number: 08/12/2024, 11:56 AM  Clinical Narrative:                 CM met with patient and her husband at the bedside, introduced self and explained role in DC planning.  She is sitting up in the bedside chair.  She is from home with her husband, independent at home, no DME for ambulation.  She has been doing OP speech and that will cont at DC.   TOC will follow for additional needs.   Expected Discharge Plan: OP Rehab Barriers to Discharge: Continued Medical Work up   Patient Goals and CMS Choice            Expected Discharge Plan and Services       Living arrangements for the past 2 months: Single Family Home                 DME Arranged: N/A         HH Arranged: NA          Prior Living Arrangements/Services Living arrangements for the past 2 months: Single Family Home Lives with:: Spouse Patient language and need for interpreter reviewed:: Yes Do you feel safe going back to the place where you live?: Yes      Need for Family Participation in Patient Care: Yes (Comment) Care giver support system in place?: Yes (comment)   Criminal Activity/Legal Involvement Pertinent to Current Situation/Hospitalization: No - Comment as needed  Activities of Daily Living   ADL Screening (condition at time of admission) Independently performs ADLs?: Yes (appropriate for developmental age) Is the patient deaf or have difficulty hearing?: No Does the patient have difficulty seeing, even when wearing glasses/contacts?: No Does the patient have difficulty concentrating, remembering, or making decisions?: No  Permission Sought/Granted Permission sought to share information with : Family Supports    Share Information with NAME: Daria Mcmeekin  Permission granted to share  info w AGENCY: [facility] OP therapy  Permission granted to share info w Relationship: spouse  Permission granted to share info w Contact Information: [phone]  Emotional Assessment Appearance:: Appears stated age Attitude/Demeanor/Rapport: Engaged Affect (typically observed): Accepting Orientation: : Oriented to Self, Oriented to Place, Oriented to  Time, Oriented to Situation Alcohol / Substance Use: Not Applicable Psych Involvement: No (comment)  Admission diagnosis:  Aphasia [R47.01] Stroke-like symptoms [R29.90] Patient Active Problem List   Diagnosis Date Noted   Cerebral infarction (HCC) 08/11/2024   Hemiparesis affecting right side as late effect of cerebrovascular accident (CVA) (HCC) 06/29/2024   Elevated LFTs 06/29/2024   Rotator cuff impingement syndrome, right 02/25/2023   Obesity, Class II, BMI 35-39.9 01/01/2020   Hyperlipidemia, mixed 05/18/2019   Mixed stress and urge urinary incontinence 04/01/2018   Right knee pain 07/26/2015   Allergic rhinitis, seasonal 12/09/2014   HTN (hypertension) 11/18/2013   Anemia, unspecified 11/18/2013   PCP:  Manya Toribio SQUIBB, PA Pharmacy:   [facility] [phone] [place], [place] - [address] AT [facility] OF [address] [facility] [place] [address] Phone: [phone] Fax: [phone]  [facility] #[id number] [place], [place] - [address] AT [facility] OF [address] [facility] [address] [place] [address] Phone:  [phone] Fax: [phone]     Social Drivers of Health (SDOH) Social History: SDOH Screenings   Food Insecurity: No Food Insecurity (08/11/2024)  Housing: Low Risk (08/11/2024)  Transportation Needs: No Transportation Needs (08/11/2024)  Utilities: Not At Risk (08/11/2024)  Alcohol Screen: Low Risk (02/20/2023)  Depression (PHQ2-9): Low Risk (07/28/2024)  Financial Resource Strain: Low Risk  (03/06/2024)   Received from [facility]  Physical Activity:  Insufficiently Active (02/20/2023)  Social Connections: Moderately Integrated (08/11/2024)  Stress: No Stress Concern Present (02/20/2023)  Tobacco Use: Medium Risk (07/31/2024)   Received from [facility] Literacy: Medium Risk (05/06/2024)   Received from [facility]   SDOH Interventions:     Readmission Risk Interventions     No data to display

## 2024-08-12 NOTE — Evaluation (Signed)
 Occupational Therapy Evaluation Patient Details Name: Abigail Malone MRN: [id number] DOB: [birth year] Today's Date: 08/12/2024   History of Present Illness   MYRIAM BRANDHORST is a 74 y.o. year old female with medical history of hypertension, hyperlipidemia, class II obesity, history of CVA in 03/2024 presented to the ED after having strokelike symptoms while working with speech therapy. MRI reveals suggestive of subacute perforator infarct in the left basal ganglia and corona radiata. Remote right basal ganglia and left PCA territory infarcts and chronic microvascular ischemic change.     Clinical Impressions Pt was seen for OT evaluation this date. Prior to hospital admission, pt was Independent in ADLs. Pt lives with spouse. Pt presents to acute OT demonstrating impaired ADL performance and functional mobility 2/2 decreased activity tolerance and functional ROM/balance/cognition deficits. Pt currently requires CGA for ambulating to the shower and steping in and out. Pt was supervision for brushing teeth standing at the sink, including reaching outside base of support. Pt needed extra cueing for sequencing for completion of task. Pt would benefit from skilled OT to address noted impairments and functional limitations (see below for any additional details). Upon hospital discharge, recommend continued Outpatient OT services.      If plan is discharge home, recommend the following:   A little help with walking and/or transfers;Assistance with cooking/housework;Assist for transportation;Help with stairs or ramp for entrance     Functional Status Assessment   Patient has had a recent decline in their functional status and demonstrates the ability to make significant improvements in function in a reasonable and predictable amount of time.     Equipment Recommendations   None recommended by OT     Recommendations for Other Services         Precautions/Restrictions    Precautions Precautions: Fall Recall of Precautions/Restrictions: Impaired Restrictions Weight Bearing Restrictions Per Provider Order: No     Mobility Bed Mobility Overal bed mobility: Needs Assistance Bed Mobility: Supine to Sit     Supine to sit: Min assist     General bed mobility comments: due to lethargy    Transfers Overall transfer level: Needs assistance Equipment used: None Transfers: Sit to/from Stand Sit to Stand: Contact guard assist                  Balance Overall balance assessment: Needs assistance Sitting-balance support: No upper extremity supported, Feet supported Sitting balance-Leahy Scale: Normal     Standing balance support: No upper extremity supported, During functional activity Standing balance-Leahy Scale: Good                             ADL either performed or assessed with clinical judgement   ADL Overall ADL's : Needs assistance/impaired                                       General ADL Comments: Pt CGA for ambulating to the shower and steping in and out. Pt was supervision for brushing teeth standing at the sink, including reaching outside base of support.     Vision         Perception         Praxis         Pertinent Vitals/Pain Pain Assessment Pain Assessment: No/denies pain     Extremity/Trunk Assessment Upper Extremity Assessment Upper Extremity Assessment: RUE deficits/detail;LUE deficits/detail RUE  Deficits / Details: 4+/5 grossly, Pt needed extra cueing for completion opposition of fingers, cognition limiting more than dexterity LUE Deficits / Details: 4+/5 grossly, Pt needed extra cueing for completion opposition of fingers, cognition limiting more than dexterity   Lower Extremity Assessment Lower Extremity Assessment: Defer to PT evaluation   Cervical / Trunk Assessment Cervical / Trunk Assessment: Normal   Communication Communication Communication: Impaired Factors  Affecting Communication: Reduced clarity of speech;Difficulty expressing self   Cognition Arousal: Lethargic Behavior During Therapy: Flat affect Cognition: Cognition impaired   Orientation impairments: Situation Awareness: Intellectual awareness intact   Attention impairment (select first level of impairment): Focused attention Executive functioning impairment (select all impairments): Initiation, Sequencing OT - Cognition Comments: Pt was delayed when asked basic questions about identity. Pt needed extra cueing for sequencing for completion of task.                 Following commands: Intact       Cueing  General Comments   Cueing Techniques: Verbal cues;Gestural cues      Exercises     Shoulder Instructions      Home Living Family/patient expects to be discharged to:: Private residence Living Arrangements: Spouse/significant other Available Help at Discharge: Family;Available 24 hours/day Type of Home: House Home Access: Level entry     Home Layout: One level     Bathroom Shower/Tub: Producer, Television/film/video: Standard     Home Equipment: None          Prior Functioning/Environment Prior Level of Function : Independent/Modified Independent               ADLs Comments: Independent    OT Problem List: Decreased strength;Decreased range of motion;Decreased activity tolerance;Decreased cognition;Decreased safety awareness;Pain   OT Treatment/Interventions: Self-care/ADL training      OT Goals(Current goals can be found in the care plan section)   Acute Rehab OT Goals Patient Stated Goal: go home OT Goal Formulation: With patient/family Time For Goal Achievement: 08/26/24 Potential to Achieve Goals: Good ADL Goals Pt Will Perform Upper Body Bathing: Independently;standing;sitting Pt Will Perform Lower Body Dressing: Independently;sit to/from stand Pt Will Transfer to Toilet: Independently;ambulating;regular height toilet   OT  Frequency:  Min 1X/week    Co-evaluation PT/OT/SLP Co-Evaluation/Treatment: Yes Reason for Co-Treatment: For patient/therapist safety;To address functional/ADL transfers;Necessary to address cognition/behavior during functional activity   OT goals addressed during session: ADL's and self-care      AM-PAC OT 6 Clicks Daily Activity     Outcome Measure Help from another person eating meals?: None Help from another person taking care of personal grooming?: A Little Help from another person toileting, which includes using toliet, bedpan, or urinal?: None Help from another person bathing (including washing, rinsing, drying)?: A Little Help from another person to put on and taking off regular upper body clothing?: None Help from another person to put on and taking off regular lower body clothing?: A Little 6 Click Score: 21   End of Session Equipment Utilized During Treatment: Gait belt  Activity Tolerance: Patient tolerated treatment well Patient left: in chair;with call bell/phone within reach;with family/visitor present  OT Visit Diagnosis: Unsteadiness on feet (R26.81);Other abnormalities of gait and mobility (R26.89);Muscle weakness (generalized) (M62.81)                Time: 9077-9052 OT Time Calculation (min): 25 min Charges:  OT General Charges $OT Visit: 1 Visit OT Evaluation $OT Eval Low Complexity: 1 Low  [employer] [employer] OTS  Ronita Sauers 08/12/2024, 10:53 AM

## 2024-08-12 NOTE — Progress Notes (Signed)
 Mobility Specialist - Progress Note   08/12/24 1308  Pain Assessment  Pain Assessment No/denies pain  Mobility  Activity Ambulated with assistance;Respositioned in chair  Level of Assistance Contact guard assist, steadying assist  Assistive Device None  Distance Ambulated (ft) 200 ft  Range of Motion/Exercises Active  Activity Response Tolerated well  Mobility visit 1 Mobility  Mobility Specialist Start Time (ACUTE ONLY) 1237  Mobility Specialist Stop Time (ACUTE ONLY) 1253  Mobility Specialist Time Calculation (min) (ACUTE ONLY) 16 min      Pt was in the recliner on RA with guest in the room upon entry. Pt agreed to mobility. Pt was able to today STS. Pt was able to ambulate well today. Pt didn't need a recovery break throughout activity. After activity pt returned to the room repositioned in the recliner with needs in reach. Pt has needs in reach.  Clem Rodes Mobility Specialist 08/12/24, 1:19 PM

## 2024-08-12 NOTE — Evaluation (Signed)
 Physical Therapy Evaluation Patient Details Name: Abigail Malone MRN: [id number] DOB: Oct 06, 1950 Today's Date: 08/12/2024  History of Present Illness  Abigail Malone is a 74 y.o. year old female with medical history of hypertension, hyperlipidemia, class II obesity, history of CVA in 03/2024 presented to the ED after having strokelike symptoms while working with speech therapy. MRI reveals suggestive of subacute perforator infarct in the left basal ganglia and corona radiata. Remote right basal ganglia and left PCA territory infarcts and chronic microvascular ischemic change.   Clinical Impression  Patient received seated on side of bed. She is somewhat lethargic and slow to respond. Patient is able to stand with cga and ambulated 150 feet without AD and cga. Patient has decreased foot clearance bilaterally and narrow base of support. Patient will continue to benefit from skilled PT to improve safety and strength.           If plan is discharge home, recommend the following: A little help with walking and/or transfers;A little help with bathing/dressing/bathroom   Can travel by private vehicle    yes    Equipment Recommendations None recommended by PT  Recommendations for Other Services       Functional Status Assessment Patient has had a recent decline in their functional status and demonstrates the ability to make significant improvements in function in a reasonable and predictable amount of time.     Precautions / Restrictions Precautions Precautions: Fall Recall of Precautions/Restrictions: Impaired Restrictions Weight Bearing Restrictions Per Provider Order: No      Mobility  Bed Mobility Overal bed mobility: Needs Assistance Bed Mobility: Supine to Sit     Supine to sit: Min assist     General bed mobility comments: due to lethargy    Transfers Overall transfer level: Needs assistance Equipment used: None Transfers: Sit to/from Stand Sit to Stand: Contact  guard assist                Ambulation/Gait Ambulation/Gait assistance: Contact guard assist Gait Distance (Feet): 150 Feet Assistive device: None Gait Pattern/deviations: Step-through pattern, Decreased step length - right, Decreased step length - left, Decreased stride length Gait velocity: decr     General Gait Details: patient ambulating at slow pace, narrow base of support, decreased foot clearance bilaterally.  Stairs            Wheelchair Mobility     Tilt Bed    Modified Rankin (Stroke Patients Only)       Balance Overall balance assessment: Mild deficits observed, not formally tested                                           Pertinent Vitals/Pain Pain Assessment Pain Assessment: No/denies pain    Home Living Family/patient expects to be discharged to:: Private residence Living Arrangements: Spouse/significant other Available Help at Discharge: Family;Available 24 hours/day Type of Home: House Home Access: Level entry       Home Layout: One level Home Equipment: None      Prior Function Prior Level of Function : Independent/Modified Independent                     Extremity/Trunk Assessment   Upper Extremity Assessment Upper Extremity Assessment: Defer to OT evaluation    Lower Extremity Assessment Lower Extremity Assessment: Generalized weakness    Cervical / Trunk Assessment Cervical / Trunk  Assessment: Normal  Communication   Communication Communication: Impaired Factors Affecting Communication: Reduced clarity of speech;Difficulty expressing self    Cognition Arousal: Lethargic Behavior During Therapy: Flat affect   PT - Cognitive impairments: Problem solving, Safety/Judgement, Initiation                         Following commands: Intact       Cueing Cueing Techniques: Verbal cues, Gestural cues     General Comments      Exercises     Assessment/Plan    PT Assessment  Patient needs continued PT services  PT Problem List Decreased strength;Decreased mobility;Decreased activity tolerance;Decreased balance;Decreased coordination;Decreased cognition;Decreased safety awareness       PT Treatment Interventions Gait training;Stair training;Functional mobility training;Therapeutic activities;Therapeutic exercise;Balance training;Cognitive remediation;Patient/family education;Neuromuscular re-education    PT Goals (Current goals can be found in the Care Plan section)  Acute Rehab PT Goals Patient Stated Goal: none stated PT Goal Formulation: With patient/family Time For Goal Achievement: 08/19/24 Potential to Achieve Goals: Good    Frequency Min 2X/week     Co-evaluation               AM-PAC PT 6 Clicks Mobility  Outcome Measure Help needed turning from your back to your side while in a flat bed without using bedrails?: A Little Help needed moving from lying on your back to sitting on the side of a flat bed without using bedrails?: A Little Help needed moving to and from a bed to a chair (including a wheelchair)?: A Little Help needed standing up from a chair using your arms (e.g., wheelchair or bedside chair)?: A Little Help needed to walk in hospital room?: A Little Help needed climbing 3-5 steps with a railing? : A Little 6 Click Score: 18    End of Session Equipment Utilized During Treatment: Gait belt Activity Tolerance: Patient tolerated treatment well Patient left: Other (comment) (patient left with OT standing at sink) Nurse Communication: Mobility status PT Visit Diagnosis: Other abnormalities of gait and mobility (R26.89);Muscle weakness (generalized) (M62.81)    Time: 9070-9060 PT Time Calculation (min) (ACUTE ONLY): 10 min   Charges:   PT Evaluation $PT Eval Low Complexity: 1 Low   PT General Charges $$ ACUTE PT VISIT: 1 Visit         Oshea Percival, PT, GCS 08/12/24,10:03 AM

## 2024-08-12 NOTE — Plan of Care (Signed)
  Problem: Education: Goal: Knowledge of disease or condition will improve Outcome: Progressing   Problem: Ischemic Stroke/TIA Tissue Perfusion: Goal: Complications of ischemic stroke/TIA will be minimized Outcome: Progressing   Problem: Coping: Goal: Will identify appropriate support needs Outcome: Progressing   Problem: Self-Care: Goal: Ability to participate in self-care as condition permits will improve Outcome: Progressing

## 2024-08-12 NOTE — Evaluation (Addendum)
 Speech Language Pathology Evaluation Patient Details Name: Abigail Malone MRN: [id number] DOB: 05/04/1951 Today's Date: 08/12/2024 Time: 1200 1211  SLP Time Calculation (min) (ACUTE ONLY): 11 min  Problem List:  Patient Active Problem List   Diagnosis Date Noted   Cerebral infarction (HCC) 08/11/2024   Hemiparesis affecting right side as late effect of cerebrovascular accident (CVA) (HCC) 06/29/2024   Elevated LFTs 06/29/2024   Rotator cuff impingement syndrome, right 02/25/2023   Obesity, Class II, BMI 35-39.9 01/01/2020   Hyperlipidemia, mixed 05/18/2019   Mixed stress and urge urinary incontinence 04/01/2018   Right knee pain 07/26/2015   Allergic rhinitis, seasonal 12/09/2014   HTN (hypertension) 11/18/2013   Anemia, unspecified 11/18/2013   Past Medical History:  Past Medical History:  Diagnosis Date   Abnormal uterine bleeding (AUB) 05/15/2016   Allergic rhinitis, seasonal 12/09/2014   Anemia, unspecified 11/18/2013   Atypical ductal hyperplasia of right breast 09/19/2021   Cardiac syncope 05/18/2019   HTN (hypertension) 11/18/2013   Hypertension    Menopausal symptoms 08/05/2014   Right knee pain 07/26/2015   Stroke [facility])    Past Surgical History:  Past Surgical History:  Procedure Laterality Date   BREAST BIOPSY Right 09/07/2021   stereo calcs, ribbon clip, path pending.   BREAST BIOPSY WITH RADIO FREQUENCY LOCALIZER Right 10/18/2021   Procedure: BREAST BIOPSY WITH RADIO FREQUENCY LOCALIZER;  Surgeon: Lane Shope, MD;  Location: [facility] ORS;  Service: General;  Laterality: Right;   BREAST EXCISIONAL BIOPSY Right 2023   COLONOSCOPY  ~2018   HPI:  Abigail Malone is a 74 y.o. year old female with medical history of hypertension, hyperlipidemia, class II obesity, history of CVA in 03/2024 presented to the ED after having strokelike symptoms while working with speech therapy.  Her complaints were multiple including dizziness, tingling in her hands and delayed  responses.  Code stroke was initiated and patient underwent CT head without any acute findings and CTA without any acute findings.  Neurology concern for embolic shower as cause of stroke so advised MRI and admission for further workup.  Lab work overall reassuring.  Urinalysis pending along with lipid panel and A1c.   Assessment / Plan / Recommendation Clinical Impression  Pt is known to this clinical research associate from outpatient ST services. Pt appears much improved over yesterday afternoon, improved speech intelligibility, smiling at this writer, independent task initiation, continued delayed responses but this was being targeted during outpatient ST. Unable to give formal assessment as pt was hungry and interested in eating/feeding herself. Will complete during next outpatient ST session.     SLP Assessment  SLP Recommendation/Assessment: All further Speech Language Pathology needs can be addressed in the next venue of care SLP Visit Diagnosis: Cognitive communication deficit (R41.841)     Assistance Recommended at Discharge  Intermittent Supervision/Assistance  Functional Status Assessment Patient has had a recent decline in their functional status and/or demonstrates limited ability to make significant improvements in function in a reasonable and predictable amount of time        SLP Evaluation Cognition  Overall Cognitive Status: History of cognitive impairments - at baseline Arousal/Alertness: Awake/alert Orientation Level: Oriented X4       Comprehension  Auditory Comprehension Overall Auditory Comprehension: Appears within functional limits for tasks assessed Visual Recognition/Discrimination Discrimination: Within Function Limits Reading Comprehension Reading Status: Within funtional limits    Expression Expression Primary Mode of Expression: Verbal Verbal Expression Overall Verbal Expression: Impaired at baseline (from previous stroke (03/2024)) Written Expression Dominant Hand:  Right Written Expression: Within Functional Limits   Oral / Motor  Oral Motor/Sensory Function Overall Oral Motor/Sensory Function: Within functional limits Motor Speech Overall Motor Speech: Appears within functional limits for tasks assessed            Trindon Dorton B. Rubbie, M.S., CCC-SLP, Tree Surgeon Certified Brain Injury Specialist [facility]  [facility] Office [phone] Ascom [phone] Fax [phone]

## 2024-08-12 NOTE — Progress Notes (Signed)
 Subjective: Husband feels patient is doing much better today.  Patient continues to complain of left hand numbness  Objective: Current vital signs: BP (!) 157/68 (BP Location: Right Arm)   Pulse 66   Temp 98.3 F (36.8 C) (Oral)   Resp 17   Ht 4' 11 (1.499 m)   Wt 79.9 kg   SpO2 95%   BMI 35.56 kg/m  Vital signs in last 24 hours: Temp:  [97.8 F (36.6 C)-98.4 F (36.9 C)] 98.3 F (36.8 C) (01/21 1200) Pulse Rate:  [64-78] 66 (01/21 1200) Resp:  [17-19] 17 (01/21 1200) BP: (103-161)/(53-99) 157/68 (01/21 1200) SpO2:  [95 %-100 %] 95 % (01/21 1200) Weight:  [79.9 kg] 79.9 kg (01/21 0500)  Intake/Output from previous day: No intake/output data recorded. Intake/Output this shift: No intake/output data recorded. Nutritional status:  Diet Order             Diet regular Room service appropriate? Yes; Fluid consistency: Thin  Diet effective now                   Neurologic Exam: Mental Status: Alert.  Responses remain delayed to some extent.  Voice soft but speech appropriate.  Flat affect. Cranial Nerves: II: Visual fields grossly normal III,IV, VI: ptosis not present, extra-ocular motions intact bilaterally V,VII: decreased right NLF, facial light touch sensation normal bilaterally VIII: hearing normal bilaterally XI: bilateral shoulder shrug XII: midline tongue extension Motor: Right : Upper extremity   5/5    Left:     Upper extremity   5/5  Lower extremity   5/5     Lower extremity   5/5 Tone and bulk:normal tone throughout; no atrophy noted.  Bradykinetic Sensory: Pinprick and light touch decreased in the left hand Deep Tendon Reflexes: Symmetric throughout    Lab Results: Basic Metabolic Panel: Recent Labs  Lab 08/11/24 1442 08/12/24 0529  NA 138 140  K 4.0 3.7  CL 103 107  CO2 23 23  GLUCOSE 95 110*  BUN 12 9  CREATININE 0.72 0.68  CALCIUM  9.7 9.2  MG 2.2  --     Liver Function Tests: Recent Labs  Lab 08/11/24 1442  AST 18  ALT 16   ALKPHOS 112  BILITOT 0.9  PROT 7.7  ALBUMIN 4.4   No results for input(s): LIPASE, AMYLASE in the last 168 hours. No results for input(s): AMMONIA in the last 168 hours.  CBC: Recent Labs  Lab 08/11/24 1442 08/12/24 0529  WBC 9.8 9.6  NEUTROABS 6.3  --   HGB 11.0* 10.4*  HCT 33.2* 30.2*  MCV 82.4 80.3  PLT 254 267    Cardiac Enzymes: No results for input(s): CKTOTAL, CKMB, CKMBINDEX, TROPONINI in the last 168 hours.  Lipid Panel: Recent Labs  Lab 08/12/24 0529  CHOL 101  TRIG 78  HDL 42  CHOLHDL 2.4  VLDL 16  LDLCALC 43    CBG: Recent Labs  Lab 08/11/24 1440 08/12/24 0743  GLUCAP 92 106*    Microbiology: Results for orders placed or performed in visit on 09/30/23  Microscopic Examination     Status: None   Collection Time: 09/30/23  2:23 PM   Urine  Result Value Ref Range Status   WBC, UA 0-5 0 - 5 /hpf Final   RBC, Urine 0-2 0 - 2 /hpf Final   Epithelial Cells (non renal) 0-10 0 - 10 /hpf Final   Bacteria, UA Few None seen/Few Final  CULTURE, URINE COMPREHENSIVE  Status: None   Collection Time: 09/30/23  2:58 PM   Specimen: Urine   UR  Result Value Ref Range Status   Urine Culture, Comprehensive Final report  Final   Organism ID, Bacteria Comment  Final    Comment: Mixed urogenital flora Less than 10,000 colonies/mL     Coagulation Studies: Recent Labs    08/11/24 1442  LABPROT 14.1  INR 1.0    Imaging: MR BRAIN WO CONTRAST Result Date: 08/11/2024 EXAM: MRI BRAIN WITHOUT CONTRAST 08/11/2024 07:42:21 PM TECHNIQUE: Multiplanar multisequence MRI of the head/brain was performed without the administration of intravenous contrast. COMPARISON: CT head earlier today. CLINICAL HISTORY: Neuro deficit, acute, stroke suspected FINDINGS: BRAIN AND VENTRICLES: Mild restricted diffusion in the left basal ganglia and corona radiata, suggestive of subacute infarct. emote right basal ganglia and left PCA territory infarcts. Additional T2  hyperintensities in the white matter, compatible with chronic microvascular ischemic disease. No intracranial hemorrhage. No mass. No midline shift. No hydrocephalus. The sella is unremarkable. Normal flow voids. ORBITS: No significant abnormality. SINUSES AND MASTOIDS: No significant abnormality. BONES AND SOFT TISSUES: Normal marrow signal. Approximately 2.3 cm T2 hyperintense mass in the deep aspect of the left parotid. IMPRESSION: 1. Findings suggestive of subacute perforator infarct in the left basal ganglia and corona radiata. 2. Remote right basal ganglia and left PCA territory infarcts and chronic microvascular ischemic change. 3. 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 Recommend ENT consultation for management. Electronically signed by: Glendia Molt MD 08/11/2024 08:19 PM EST RP Workstation: [id number]   CT ANGIO HEAD NECK W WO CM (CODE STROKE) Result Date: 08/11/2024 EXAM: CTA HEAD AND NECK WITH AND WITHOUT 08/11/2024 03:04:26 PM TECHNIQUE: CTA of the head and neck was performed with and without the administration of 75 mL iohexol  (OMNIPAQUE ) 350 MG/ML injection. Multiplanar 2D and/or 3D reformatted images are provided for review. Automated exposure control, iterative reconstruction, and/or weight based adjustment of the mA/kV was utilized to reduce the radiation dose to as low as reasonably achievable. Stenosis of the internal carotid arteries measured using NASCET criteria. COMPARISON: Same day CT head CLINICAL HISTORY: Neuro deficit, acute, stroke suspected. FINDINGS: CTA NECK: AORTIC ARCH AND ARCH VESSELS: Mild atherosclerosis of the aortic arch. No dissection or arterial injury. No significant stenosis of the brachiocephalic or subclavian arteries. CERVICAL CAROTID ARTERIES: Mild tortuosity of the proximal right common carotid artery. There is mild atherosclerosis at the right carotid bifurcation without hemodynamically significant  stenosis. Mild atherosclerosis at the left carotid bifurcation without hemodynamically significant stenosis. No dissection or arterial injury. CERVICAL VERTEBRAL ARTERIES: The right vertebral artery is dominant. The non-dominant left vertebral artery terminates at the origin of the left PICA. No dissection, arterial injury, or significant stenosis. LUNGS AND MEDIASTINUM: Unremarkable. SOFT TISSUES: There is a heterogeneous enhancing lesion in the left parotid gland primarily involving the deep lobe which measures 2.4 x 1.1 x 1.4 cm (series 4, image 77 and series 8, image 100). Recommend contrast enhanced MRI of the neck for further evaluation of this lesion. There is a mildly prominent left periparotid/cervical lymph node measuring up to 0.8 cm in short axis. Subcentimeter thyroid  nodules. BONES: Degenerative changes in the visualized spine. Trace degenerative anterolisthesis of C4 on C5. CTA HEAD: ANTERIOR CIRCULATION: Atherosclerosis involving the carotid siphons with mild stenosis of the bilateral cavernous ICAs. There is a 3 mm inferiorly directed outpouching along the left supraclinoid ICA concerning for a posterior communicating artery aneurysm (series  6, image 1100). The ACAs are patent bilaterally. There is moderate stenosis of the distal A2 segment of the left ACA. The MCAs are patent bilaterally. There is atherosclerotic irregularity and mild stenosis of multiple M2 branches of the bilateral MCAs. POSTERIOR CIRCULATION: Fetal origin of the right PCA. There is focal moderate stenosis of the P2 segment of the left PCA. There is irregularity and mild stenosis of the proximal basilar artery. No significant stenosis of the intracranial vertebral arteries. No aneurysm. OTHER: No dural venous sinus thrombosis on this non-dedicated study. IMPRESSION: 1. No acute large vessel occlusion. 2. Intracranial atherosclerotic disease with mild stenosis of the bilateral cavernous ICAs, multiple M2 branches, and proximal  basilar artery, with moderate stenosis of the distal left A2 segment and focal moderate stenosis of the left P2 segment. 3. 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 4. Heterogeneous enhancing lesion in the left parotid gland centered in the deep lobe measuring 2.4 x 1.1 x 1.4 cm. Recommend contrast-enhanced MRI of the neck for further evaluation. Electronically signed by: Donnice Mania MD 08/11/2024 03:34 PM EST RP Workstation: [id number]   CT HEAD CODE STROKE WO CONTRAST (LKW 0-4.5h, LVO 0-24h) Result Date: 08/11/2024 EXAM: CT HEAD WITHOUT CONTRAST 08/11/2024 02:49:18 PM TECHNIQUE: CT of the head was performed without the administration of intravenous contrast. Automated exposure control, iterative reconstruction, and/or weight based adjustment of the mA/kV was utilized to reduce the radiation dose to as low as reasonably achievable. COMPARISON: None available. CLINICAL HISTORY: Neuro deficit, acute, stroke suspected. FINDINGS: BRAIN AND VENTRICLES: There is no evidence of an acute infarct, intracranial hemorrhage, mass, midline shift, hydrocephalus, or extra-axial fluid collection. Chronic infarcts are present in the basal ganglia regions bilaterally, right larger than left with associated ex vacuo dilatation of the frontal horns of the lateral ventricles, and there is also a small chronic left occipital infarct. Hypodensities elsewhere in the cerebral white matter bilaterally are nonspecific but compatible with mild chronic small vessel ischemic disease. Calcified atherosclerosis at the skull base. ORBITS: No acute abnormality. SINUSES: No acute abnormality. SOFT TISSUES AND SKULL: No acute soft tissue abnormality. No skull fracture. [place] Stroke Program Early CT Score (ASPECTS) ----- Ganglionic (caudate, IC, lentiform nucleus, insula, M1-M3): 7 Supraganglionic (M4-M6): 3 Total: 10 IMPRESSION: 1. No acute intracranial  abnormality. ASPECTS of 10. 2. Chronic bilateral basal ganglia and left occipital infarcts. 3. These results were communicated to Dr. FREDRIK Hock at 2:54 pm on 08/11/2024 by secure text page via the [facility] messaging system. Electronically signed by: Dasie Hamburg MD 08/11/2024 02:55 PM EST RP Workstation: [id number]    Medications: I have reviewed the patient's current medications. Scheduled:  aspirin   81 mg Oral Daily   atorvastatin   80 mg Oral Daily   carvedilol   6.25 mg Oral BID   clopidogrel   75 mg Oral Daily   heparin   5,000 Units Subcutaneous Q8H   irbesartan   150 mg Oral Daily   [START ON 08/13/2024] NIFEdipine   60 mg Oral Daily   sodium chloride  flush  3 mL Intravenous Q12H   spironolactone   25 mg Oral Daily    Assessment/Plan: 74 y.o. female with a history of a basal ganglia infarct in September of 2025 on ASA and HTN who was at speech therapy and had acute onset of dizziness.  Then noted that both hands were tingling.  Responses were noted to be appropriate but delayed.  Patient seen as a code stroke.  Improved today.  MRI of the brain personally reviewed and reveals a subacute left basal ganglia infarct in much the same location as her infarct in September of last year.  Not consistent with symptoms of left hand numbness.  Do not feel cause of current presentation.  It is interesting though that her BP was low during the event.  Concerned about hypoperfusion of stroke bed with hypotension causing exacerbation of symptoms.  BP better today.   LDL 43, A1c 6.1.  Recommendations: Would adjust BP medications to allow better brain perfusion Continue statin Dual antiplatelet therapy with ASA 81mg  and Plavix  75mg  for three weeks with change to Plavix  75mg  alone as monotherapy after that time.  Patient to follow up outpatient with PCP and neurology  Case discussed with Dr. Tobie   LOS: 0 days   Sonny Hock, MD Neurology  08/12/2024  1:53 PM

## 2024-08-12 NOTE — Plan of Care (Signed)

## 2024-08-12 NOTE — Evaluation (Signed)
 Clinical/Bedside Swallow Evaluation Patient Details  Name: Abigail Malone MRN: [id number] Date of Birth: [birth year]  Today's Date: 08/12/2024 Time: SLP Start Time (ACUTE ONLY): 1150 SLP Stop Time (ACUTE ONLY): 1200 SLP Time Calculation (min) (ACUTE ONLY): 10 min  Past Medical History:  Past Medical History:  Diagnosis Date   Abnormal uterine bleeding (AUB) 05/15/2016   Allergic rhinitis, seasonal 12/09/2014   Anemia, unspecified 11/18/2013   Atypical ductal hyperplasia of right breast 09/19/2021   Cardiac syncope 05/18/2019   HTN (hypertension) 11/18/2013   Hypertension    Menopausal symptoms 08/05/2014   Right knee pain 07/26/2015   Stroke [facility])    Past Surgical History:  Past Surgical History:  Procedure Laterality Date   BREAST BIOPSY Right 09/07/2021   stereo calcs, ribbon clip, path pending.   BREAST BIOPSY WITH RADIO FREQUENCY LOCALIZER Right 10/18/2021   Procedure: BREAST BIOPSY WITH RADIO FREQUENCY LOCALIZER;  Surgeon: Lane Shope, MD;  Location: [facility] ORS;  Service: General;  Laterality: Right;   BREAST EXCISIONAL BIOPSY Right 2023   COLONOSCOPY  ~2018   HPI:  Abigail Malone is a 74 y.o. year old female with medical history of hypertension, hyperlipidemia, class II obesity, history of CVA in 03/2024 presented to the ED after having strokelike symptoms while working with speech therapy.  Her complaints were multiple including dizziness, tingling in her hands and delayed responses.  Code stroke was initiated and patient underwent CT head without any acute findings and CTA without any acute findings.  Neurology concern for embolic shower as cause of stroke so advised MRI and admission for further workup.  Lab work overall reassuring.  Urinalysis pending along with lipid panel and A1c.    Assessment / Plan / Recommendation  Clinical Impression  ST services consulted for a bedside swallow evaluation as pt failed stroke swallow screen d/t pt failed attempt to take  medicines - Pt unable to tolerate swallowing without aspiration & pocketing. Resulted in fail for study, speech eval. placed.   Pt is known to this clinical research associate as pt is currently participating in outpatient ST with me following completion of HHST related to CVA (03/2024). After CVA in 03/2024 pt tolerated a regular diet with thin liquids BUT began holding her medicines in her mouth (even when crushed and given with applesauce) until the medicines dissolve. Pt openly states that she doesn't know the reason why she does this and that they are so bitter referring to the crushed medicines. This was no targeted during outpatient ST sessions as her cognitive communication abilities were more of a priority.   During today's evaluation, pt presents with adequate oropharyngeal abilities when consuming thin liquids via cup and straw as well as whole graham crackers. Pt with complete mastication, oral clearing and the appearance of a timely swallow, no overt s/s of aspiration. At this time, recommend a regular diet with thin liquids, medicine crushed in puree. Education provided to pt's treatment team on baseline oral holding of medications.    SLP Visit Diagnosis: Dysphagia, unspecified (R13.10)    Aspiration Risk  Mild aspiration risk    Diet Recommendation    Regular with thin liquids, medicine crushed in puree       Other Recommendations Oral Care Recommendations: Oral care BID     Swallow Evaluation Recommendations Recommendations: PO diet PO Diet Recommendation: Regular;Thin liquids (Level 0) Liquid Administration via: Straw;Cup Medication Administration: Crushed with puree Supervision: Patient able to self-feed Swallowing strategies  : Minimize environmental distractions;Slow rate;Small bites/sips Postural changes: Position  pt fully upright for meals;Stay upright 30-60 min after meals Oral care recommendations: Oral care BID (2x/day)      Functional Status Assessment Patient has had a recent  decline in their functional status and/or demonstrates limited ability to make significant improvements in function in a reasonable and predictable amount of time         Prognosis Prognosis for improved oropharyngeal function: Guarded Barriers to Reach Goals: Cognitive deficits;Time post onset;Severity of deficits (when consuming medicines)      Swallow Study   General Date of Onset: 08/11/24 HPI: Abigail Malone is a 74 y.o. year old female with medical history of hypertension, hyperlipidemia, class II obesity, history of CVA in 03/2024 presented to the ED after having strokelike symptoms while working with speech therapy.  Her complaints were multiple including dizziness, tingling in her hands and delayed responses.  Code stroke was initiated and patient underwent CT head without any acute findings and CTA without any acute findings.  Neurology concern for embolic shower as cause of stroke so advised MRI and admission for further workup.  Lab work overall reassuring.  Urinalysis pending along with lipid panel and A1c. Type of Study: Bedside Swallow Evaluation Previous Swallow Assessment: previous admission at [facility] Diet Prior to this Study: NPO Temperature Spikes Noted: No Respiratory Status: Room air History of Recent Intubation: No Behavior/Cognition: Alert;Cooperative;Pleasant mood Oral Cavity Assessment: Within Functional Limits Oral Care Completed by SLP: No Oral Cavity - Dentition: Adequate natural dentition Vision: Functional for self-feeding Self-Feeding Abilities: Able to feed self Patient Positioning: Upright in chair Baseline Vocal Quality: Normal Volitional Cough: Strong Volitional Swallow: Able to elicit    Oral/Motor/Sensory Function Overall Oral Motor/Sensory Function: Within functional limits   Ice Chips Ice chips: Not tested   Thin Liquid Thin Liquid: Within functional limits Presentation: Straw;Self Fed    Nectar Thick Nectar Thick Liquid: Not tested   Honey  Thick Honey Thick Liquid: Not tested   Puree Puree: Within functional limits Presentation: Self Fed;Spoon   Solid     Solid: Within functional limits Presentation: Self Fed     Karyme Mcconathy B. Rubbie, M.S., CCC-SLP, Tree Surgeon Certified Brain Injury Specialist [facility]  [facility] Office [phone] Ascom [phone] Fax [phone]

## 2024-08-12 NOTE — Progress Notes (Signed)
*  PRELIMINARY RESULTS* Echocardiogram 2D Echocardiogram has been performed.  Abigail Malone 08/12/2024, 8:45 AM

## 2024-08-13 ENCOUNTER — Ambulatory Visit: Admitting: Speech Pathology

## 2024-08-13 ENCOUNTER — Ambulatory Visit: Admitting: Physical Therapy

## 2024-08-13 ENCOUNTER — Ambulatory Visit

## 2024-08-13 DIAGNOSIS — I639 Cerebral infarction, unspecified: Secondary | ICD-10-CM | POA: Diagnosis not present

## 2024-08-13 MED ORDER — CARVEDILOL 6.25 MG PO TABS: 6.2500 mg | ORAL_TABLET | Freq: Two times a day (BID) | ORAL | 1 refills | Status: AC

## 2024-08-13 MED ORDER — CLOPIDOGREL BISULFATE 75 MG PO TABS: 75.0000 mg | ORAL_TABLET | Freq: Every day | ORAL | 2 refills | Status: AC

## 2024-08-13 MED ORDER — ASPIRIN 81 MG PO CHEW: 81.0000 mg | CHEWABLE_TABLET | Freq: Every day | ORAL | 0 refills | Status: AC

## 2024-08-13 NOTE — Plan of Care (Signed)

## 2024-08-13 NOTE — Progress Notes (Signed)
 SPIRITUAL CARE AND COUNSELING CONSULT NOTE   VISIT SUMMARY  Chaplain responded to patient request for AD information. Husband was bedside and shared history of patient.  SPIRITUAL ENCOUNTER                                                                                                                                                                      Type of Visit: Follow up Care provided to:: Family Referral source: Nurse (RN/NT/LPN) Reason for visit: Advance directives   SPIRITUAL FRAMEWORK      GOALS       INTERVENTIONS        INTERVENTION OUTCOMES      SPIRITUAL CARE PLAN        If immediate needs arise,    Desare Duddy  08/13/2024 7:48 AM

## 2024-08-13 NOTE — Discharge Summary (Signed)
 " Physician Discharge Summary   Patient: Abigail Malone MRN: [id number] DOB: Nov 24, 1950  Admit date:     08/11/2024  Discharge date: 08/13/24  Discharge Physician: Leita Blanch   PCP: Manya Toribio SQUIBB, PA   Recommendations at discharge:   follow-up PCP in 1 to 2 week  Discharge Diagnoses: Principal Problem:   Cerebral infarction [facility]) Active Problems:   HTN (hypertension)   Mixed stress and urge urinary incontinence   Hyperlipidemia, mixed   Obesity, Class II, BMI 35-39.9  Abigail Malone is a 74 y.o. year old female with medical history of hypertension, hyperlipidemia, class II obesity, history of CVA in 03/2024 presented to the ED after having strokelike symptoms while working with speech therapy.  Her complaints were multiple including dizziness, tingling in her hands and delayed responses.  Code stroke was initiated and patient underwent CT head without any acute findings and CTA without any acute findings.  Neurology concern for embolic shower as cause of stroke so advised MRI and admission for further workup.  Lab work overall reassuring.     MRI brain:  Findings suggestive of subacute perforator infarct in the left basal ganglia and corona radiata. 2. Remote right basal ganglia and left PCA territory infarcts and chronic microvascular ischemic change. 3. 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 Recommend ENT consultation for management.     Dizziness/Possible stroke/ Low bp transiently --  Patient did not have LVO so not a candidate for acute intervention.  -- Neurology consulted, appreciate their recommendations.   --MRI resulted after neurology evaluation and showed subacute perforator infarct in the left basal ganglia and corona radiata.  --per Neurology similar to last MRI  0/03/2024 --CTA was without stroke or LVO.  It showed heterogenous enhancing lesion in the left parotid gland that was previously  present on care everywhere.  (See below).  - Allow for permissive HTN in the setting of stroke, given subacute nature this can be normalized tomorrow.  Her blood pressure currently is normotensive. - ASA 81 mg daily and Plavix  75 mg for 21 days followed by plavix   alone - High Intensity Statin -- ambulated well with physical therapy. Continue outpatient therapy. -- Adjusting BP meds to ensure no sudden drops in blood pressure.   History of hypertension transient low blood pressure at admission --d/w Neurology resumed Coreg  6.25 BID (was on 12.5 mg bid), Spironolactone ,valsartan  every day    3 mm posterior communicating aneurysm: Likely needs good blood pressure control and very unlikely intervention needed on this.  Will defer to neurology for further recommendations.   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  Surgery was recommended but deferred given her recent stroke.  She is to have repeat scan in 4 to 6 months and follow-up with ENT.   -- Patient follows at [facility] per husband   Class II obesity: Discussed lifestyle modifications.  Will benefit from weight loss with improvement of blood pressure and cholesterol along with overall health.  Overall hemodynamically stable. Discussed discharge plan with patient and husband at bedside. Further adjustment and blood pressure meds per PCP. I advised patient's husband to keep log of BP at home.       Procedures: Family communication : husband Consults : neurology CODE STATUS: full DVT Prophylaxis : heparin        Disposition: Home Diet recommendation:  Cardiac diet DISCHARGE MEDICATION: Allergies as  of 08/13/2024       Reactions   Amlodipine    Leg cramping        Medication List     PAUSE taking these medications    NIFEdipine  60 MG 24 hr tablet Wait to take this until your doctor or other care provider tells you to start again. By PCP--BP is soft and  would like ot monitor at home before resuming Commonly known as: PROCARDIA  XL/NIFEDICAL XL Take 60 mg by mouth.       STOP taking these medications    furosemide 20 MG tablet Commonly known as: LASIX       TAKE these medications    amantadine 50 MG/5ML solution Commonly known as: SYMMETREL Take 50 mg by mouth 2 (two) times daily. The timing of this medication is very important.   acetaminophen  500 MG tablet Commonly known as: TYLENOL  Take 650 mg by mouth every 6 (six) hours as needed for moderate pain (pain score 4-6).   aspirin  81 MG chewable tablet Chew 1 tablet (81 mg total) by mouth daily for 21 days. What changed: how much to take   atorvastatin  80 MG tablet Commonly known as: LIPITOR Take 80 mg by mouth.   carvedilol  6.25 MG tablet Commonly known as: COREG  Take 1 tablet (6.25 mg total) by mouth 2 (two) times daily. What changed:  medication strength how much to take   clopidogrel  75 MG tablet Commonly known as: PLAVIX  Take 1 tablet (75 mg total) by mouth daily.   spironolactone  25 MG tablet Commonly known as: ALDACTONE  Take 1 tablet (25 mg total) by mouth daily.   valsartan  160 MG tablet Commonly known as: DIOVAN  Take 1 tablet (160 mg total) by mouth daily.        Follow-up Information     Manya Toribio SQUIBB, PA Follow up.   Specialty: Physician Assistant Why: hospital follow up Contact information: [address] [place] [address] [phone]                Discharge Exam: [name]   08/11/24 1448 08/12/24 0500 08/13/24 0451  Weight: 79.9 kg 79.9 kg 73.5 kg  GENERAL:  74 y.o.-year-old patient with no acute distress.  LUNGS: Normal breath sounds bilaterally CARDIOVASCULAR: S1, S2 normal. No murmur   ABDOMEN: Soft, nontender, nondistended.  EXTREMITIES: No  edema b/l.    NEUROLOGIC: nonfocal  patient is alert and awake   Condition at discharge: fair  The results of significant diagnostics from this  hospitalization (including imaging, microbiology, ancillary and laboratory) are listed below for reference.   Imaging Studies: ECHOCARDIOGRAM COMPLETE BUBBLE STUDY Result Date: 08/12/2024    ECHOCARDIOGRAM REPORT   Patient Name:   KARIYA LAVERGNE Date of Exam: 08/12/2024 Medical Rec #:  [id number]        Height:       59.0 in Accession #:    [id number]       Weight:       176.1 lb Date of Birth:  Feb 09, 1951        BSA:          1.747 m Patient Age:    73 years         BP:           127/99 mmHg Patient Gender: F                HR:           71 bpm. Exam Location:  [facility] Procedure: 2D Echo, Cardiac Doppler, Color Doppler and Saline Contrast Bubble            Study (Both Spectral and Color Flow Doppler were utilized during            procedure). Indications:     Stroke 434.91 / I63.9  History:         Patient has no prior history of Echocardiogram examinations.                  Stroke; Risk Factors:Hypertension.  Sonographer:     [name] Furnace Referring Phys:  [id number] [facility] Diagnosing Phys: Evalene Lunger MD IMPRESSIONS  1. Left ventricular ejection fraction, by estimation, is 60 to 65%. The left ventricle has normal function. The left ventricle has no regional wall motion abnormalities. Left ventricular diastolic parameters are consistent with Grade I diastolic dysfunction (impaired relaxation).  2. Right ventricular systolic function is normal. The right ventricular size is normal. There is mildly elevated pulmonary artery systolic pressure. The estimated right ventricular systolic pressure is 37.3 mmHg.  3. The mitral valve is normal in structure. Mild mitral valve regurgitation. No evidence of mitral stenosis.  4. The aortic valve is tricuspid. Aortic valve regurgitation is not visualized. No aortic stenosis is present.  5. The inferior vena cava is normal in size with greater than 50% respiratory variability, suggesting right atrial pressure of 3 mmHg. FINDINGS  Left Ventricle: Left ventricular ejection  fraction, by estimation, is 60 to 65%. The left ventricle has normal function. The left ventricle has no regional wall motion abnormalities. Strain was performed and the global longitudinal strain is indeterminate. The left ventricular internal cavity size was normal in size. There is no left ventricular hypertrophy. Left ventricular diastolic parameters are consistent with Grade I diastolic dysfunction (impaired relaxation). Right Ventricle: The right ventricular size is normal. No increase in right ventricular wall thickness. Right ventricular systolic function is normal. There is mildly elevated pulmonary artery systolic pressure. The tricuspid regurgitant velocity is 2.84  m/s, and with an assumed right atrial pressure of 5 mmHg, the estimated right ventricular systolic pressure is 37.3 mmHg. Left Atrium: Left atrial size was normal in size. Right Atrium: Right atrial size was normal in size. Pericardium: There is no evidence of pericardial effusion. Mitral Valve: The mitral valve is normal in structure. Mild mitral valve regurgitation. No evidence of mitral valve stenosis. MV peak gradient, 4.8 mmHg. The mean mitral valve gradient is 2.0 mmHg. Tricuspid Valve: The tricuspid valve is normal in structure. Tricuspid valve regurgitation is mild . No evidence of tricuspid stenosis. Aortic Valve: The aortic valve is tricuspid. Aortic valve regurgitation is not visualized. No aortic stenosis is present. Aortic valve mean gradient measures 3.0 mmHg. Aortic valve peak gradient measures 5.8 mmHg. Aortic valve area, by VTI measures 3.72 cm. Pulmonic Valve: The pulmonic valve was normal in structure. Pulmonic valve regurgitation is not visualized. No evidence of pulmonic stenosis. Aorta: The aortic root is normal in size and structure. Venous: The inferior vena cava is normal in size with greater than 50% respiratory variability, suggesting right atrial pressure of 3 mmHg. IAS/Shunts: No atrial level shunt detected by  color flow Doppler. Agitated saline contrast was given intravenously to evaluate for intracardiac shunting. Additional Comments: 3D was performed not requiring image post processing on an independent workstation and was indeterminate.  LEFT VENTRICLE PLAX 2D LVIDd:         4.70 cm   Diastology LVIDs:  2.70 cm   LV e' medial:    8.05 cm/s LV PW:         1.10 cm   LV E/e' medial:  11.1 LV IVS:        1.30 cm   LV e' lateral:   7.07 cm/s LVOT diam:     2.00 cm   LV E/e' lateral: 12.6 LV SV:         105 LV SV Index:   60 LVOT Area:     3.14 cm LV IVRT:       103 msec  RIGHT VENTRICLE RV Basal diam:  2.70 cm RV Mid diam:    2.40 cm RV S prime:     10.80 cm/s LEFT ATRIUM             Index        RIGHT ATRIUM           Index LA diam:        2.70 cm 1.55 cm/m   RA Area:     14.50 cm LA Vol (A2C):   37.2 ml 21.29 ml/m  RA Volume:   34.00 ml  19.46 ml/m LA Vol (A4C):   38.3 ml 21.92 ml/m LA Biplane Vol: 37.4 ml 21.41 ml/m  AORTIC VALVE AV Area (Vmax):    3.46 cm AV Area (Vmean):   3.51 cm AV Area (VTI):     3.72 cm AV Vmax:           120.00 cm/s AV Vmean:          85.000 cm/s AV VTI:            0.281 m AV Peak Grad:      5.8 mmHg AV Mean Grad:      3.0 mmHg LVOT Vmax:         132.00 cm/s LVOT Vmean:        94.900 cm/s LVOT VTI:          0.333 m LVOT/AV VTI ratio: 1.19  AORTA Ao Root diam: 3.10 cm MITRAL VALVE                TRICUSPID VALVE MV Area (PHT): 3.93 cm     TR Peak grad:   32.3 mmHg MV Area VTI:   3.59 cm     TR Vmax:        284.00 cm/s MV Peak grad:  4.8 mmHg MV Mean grad:  2.0 mmHg     SHUNTS MV Vmax:       1.09 m/s     Systemic VTI:  0.33 m MV Vmean:      69.2 cm/s    Systemic Diam: 2.00 cm MV Decel Time: 193 msec MV E velocity: 89.10 cm/s MV A velocity: 102.00 cm/s MV E/A ratio:  0.87 Evalene Lunger MD Electronically signed by Evalene Lunger MD Signature Date/Time: 08/12/2024/3:57:55 PM    Final    MR BRAIN WO CONTRAST Result Date: 08/11/2024 EXAM: MRI BRAIN WITHOUT CONTRAST 08/11/2024  07:42:21 PM TECHNIQUE: Multiplanar multisequence MRI of the head/brain was performed without the administration of intravenous contrast. COMPARISON: CT head earlier today. CLINICAL HISTORY: Neuro deficit, acute, stroke suspected FINDINGS: BRAIN AND VENTRICLES: Mild restricted diffusion in the left basal ganglia and corona radiata, suggestive of subacute infarct. emote right basal ganglia and left PCA territory infarcts. Additional T2 hyperintensities in the white matter, compatible with chronic microvascular ischemic disease. No intracranial hemorrhage. No mass. No midline shift. No hydrocephalus. The sella  is unremarkable. Normal flow voids. ORBITS: No significant abnormality. SINUSES AND MASTOIDS: No significant abnormality. BONES AND SOFT TISSUES: Normal marrow signal. Approximately 2.3 cm T2 hyperintense mass in the deep aspect of the left parotid. IMPRESSION: 1. Findings suggestive of subacute perforator infarct in the left basal ganglia and corona radiata. 2. Remote right basal ganglia and left PCA territory infarcts and chronic microvascular ischemic change. 3. 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 Recommend ENT consultation for management. Electronically signed by: Glendia Molt MD 08/11/2024 08:19 PM EST RP Workstation: [id number]   CT ANGIO HEAD NECK W WO CM (CODE STROKE) Result Date: 08/11/2024 EXAM: CTA HEAD AND NECK WITH AND WITHOUT 08/11/2024 03:04:26 PM TECHNIQUE: CTA of the head and neck was performed with and without the administration of 75 mL iohexol  (OMNIPAQUE ) 350 MG/ML injection. Multiplanar 2D and/or 3D reformatted images are provided for review. Automated exposure control, iterative reconstruction, and/or weight based adjustment of the mA/kV was utilized to reduce the radiation dose to as low as reasonably achievable. Stenosis of the internal carotid arteries measured using NASCET criteria. COMPARISON: Same day CT head  CLINICAL HISTORY: Neuro deficit, acute, stroke suspected. FINDINGS: CTA NECK: AORTIC ARCH AND ARCH VESSELS: Mild atherosclerosis of the aortic arch. No dissection or arterial injury. No significant stenosis of the brachiocephalic or subclavian arteries. CERVICAL CAROTID ARTERIES: Mild tortuosity of the proximal right common carotid artery. There is mild atherosclerosis at the right carotid bifurcation without hemodynamically significant stenosis. Mild atherosclerosis at the left carotid bifurcation without hemodynamically significant stenosis. No dissection or arterial injury. CERVICAL VERTEBRAL ARTERIES: The right vertebral artery is dominant. The non-dominant left vertebral artery terminates at the origin of the left PICA. No dissection, arterial injury, or significant stenosis. LUNGS AND MEDIASTINUM: Unremarkable. SOFT TISSUES: There is a heterogeneous enhancing lesion in the left parotid gland primarily involving the deep lobe which measures 2.4 x 1.1 x 1.4 cm (series 4, image 77 and series 8, image 100). Recommend contrast enhanced MRI of the neck for further evaluation of this lesion. There is a mildly prominent left periparotid/cervical lymph node measuring up to 0.8 cm in short axis. Subcentimeter thyroid  nodules. BONES: Degenerative changes in the visualized spine. Trace degenerative anterolisthesis of C4 on C5. CTA HEAD: ANTERIOR CIRCULATION: Atherosclerosis involving the carotid siphons with mild stenosis of the bilateral cavernous ICAs. There is a 3 mm inferiorly directed outpouching along the left supraclinoid ICA concerning for a posterior communicating artery aneurysm (series 6, image 1100). The ACAs are patent bilaterally. There is moderate stenosis of the distal A2 segment of the left ACA. The MCAs are patent bilaterally. There is atherosclerotic irregularity and mild stenosis of multiple M2 branches of the bilateral MCAs. POSTERIOR CIRCULATION: Fetal origin of the right PCA. There is focal moderate  stenosis of the P2 segment of the left PCA. There is irregularity and mild stenosis of the proximal basilar artery. No significant stenosis of the intracranial vertebral arteries. No aneurysm. OTHER: No dural venous sinus thrombosis on this non-dedicated study. IMPRESSION: 1. No acute large vessel occlusion. 2. Intracranial atherosclerotic disease with mild stenosis of the bilateral cavernous ICAs, multiple M2 branches, and proximal basilar artery, with moderate stenosis of the distal left A2 segment and focal moderate stenosis of the left P2 segment. 3. 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 4. Heterogeneous enhancing lesion in the left parotid gland centered in the deep lobe  measuring 2.4 x 1.1 x 1.4 cm. Recommend contrast-enhanced MRI of the neck for further evaluation. Electronically signed by: Donnice Mania MD 08/11/2024 03:34 PM EST RP Workstation: [id number]   CT HEAD CODE STROKE WO CONTRAST (LKW 0-4.5h, LVO 0-24h) Result Date: 08/11/2024 EXAM: CT HEAD WITHOUT CONTRAST 08/11/2024 02:49:18 PM TECHNIQUE: CT of the head was performed without the administration of intravenous contrast. Automated exposure control, iterative reconstruction, and/or weight based adjustment of the mA/kV was utilized to reduce the radiation dose to as low as reasonably achievable. COMPARISON: None available. CLINICAL HISTORY: Neuro deficit, acute, stroke suspected. FINDINGS: BRAIN AND VENTRICLES: There is no evidence of an acute infarct, intracranial hemorrhage, mass, midline shift, hydrocephalus, or extra-axial fluid collection. Chronic infarcts are present in the basal ganglia regions bilaterally, right larger than left with associated ex vacuo dilatation of the frontal horns of the lateral ventricles, and there is also a small chronic left occipital infarct. Hypodensities elsewhere in the cerebral white matter bilaterally are nonspecific but  compatible with mild chronic small vessel ischemic disease. Calcified atherosclerosis at the skull base. ORBITS: No acute abnormality. SINUSES: No acute abnormality. SOFT TISSUES AND SKULL: No acute soft tissue abnormality. No skull fracture. [place] Stroke Program Early CT Score (ASPECTS) ----- Ganglionic (caudate, IC, lentiform nucleus, insula, M1-M3): 7 Supraganglionic (M4-M6): 3 Total: 10 IMPRESSION: 1. No acute intracranial abnormality. ASPECTS of 10. 2. Chronic bilateral basal ganglia and left occipital infarcts. 3. These results were communicated to Dr. FREDRIK Hock at 2:54 pm on 08/11/2024 by secure text page via the [facility] messaging system. Electronically signed by: Dasie Hamburg MD 08/11/2024 02:55 PM EST RP Workstation: [id number]    Microbiology: Results for orders placed or performed in visit on 09/30/23  Microscopic Examination     Status: None   Collection Time: 09/30/23  2:23 PM   Urine  Result Value Ref Range Status   WBC, UA 0-5 0 - 5 /hpf Final   RBC, Urine 0-2 0 - 2 /hpf Final   Epithelial Cells (non renal) 0-10 0 - 10 /hpf Final   Bacteria, UA Few None seen/Few Final  CULTURE, URINE COMPREHENSIVE     Status: None   Collection Time: 09/30/23  2:58 PM   Specimen: Urine   UR  Result Value Ref Range Status   Urine Culture, Comprehensive Final report  Final   Organism ID, Bacteria Comment  Final    Comment: Mixed urogenital flora Less than 10,000 colonies/mL     Labs: CBC: Recent Labs  Lab 08/11/24 1442 08/12/24 0529  WBC 9.8 9.6  NEUTROABS 6.3  --   HGB 11.0* 10.4*  HCT 33.2* 30.2*  MCV 82.4 80.3  PLT 254 267   Basic Metabolic Panel: Recent Labs  Lab 08/11/24 1442 08/12/24 0529  NA 138 140  K 4.0 3.7  CL 103 107  CO2 23 23  GLUCOSE 95 110*  BUN 12 9  CREATININE 0.72 0.68  CALCIUM  9.7 9.2  MG 2.2  --    Liver Function Tests: Recent Labs  Lab 08/11/24 1442  AST 18  ALT 16  ALKPHOS 112  BILITOT 0.9  PROT 7.7  ALBUMIN 4.4   CBG: Recent Labs   Lab 08/11/24 1440 08/12/24 0743  GLUCAP 92 106*    Discharge time spent: greater than 30 minutes.  Signed: Leita Blanch, MD [facility] 08/13/2024 "

## 2024-08-13 NOTE — Discharge Instructions (Signed)
 Keep log of BP meds at home and hold BP med if SBP <120. Review BP log with PCP

## 2024-08-14 ENCOUNTER — Encounter: Payer: Self-pay | Admitting: Physician Assistant

## 2024-08-14 ENCOUNTER — Ambulatory Visit (INDEPENDENT_AMBULATORY_CARE_PROVIDER_SITE_OTHER): Admitting: Physician Assistant

## 2024-08-14 VITALS — BP 122/62 | HR 73 | Temp 98.5°F | Ht 59.0 in | Wt 166.0 lb

## 2024-08-14 DIAGNOSIS — R42 Dizziness and giddiness: Secondary | ICD-10-CM

## 2024-08-14 DIAGNOSIS — I69351 Hemiplegia and hemiparesis following cerebral infarction affecting right dominant side: Secondary | ICD-10-CM | POA: Diagnosis not present

## 2024-08-14 DIAGNOSIS — R4189 Other symptoms and signs involving cognitive functions and awareness: Secondary | ICD-10-CM | POA: Insufficient documentation

## 2024-08-14 MED ORDER — AMANTADINE HCL 50 MG/5ML PO SOLN: 50.0000 mg | Freq: Two times a day (BID) | ORAL | 2 refills | Status: AC

## 2024-08-14 NOTE — Assessment & Plan Note (Signed)
 Continue working with speech and occupational therapy as scheduled.  Upcoming visits were printed for patient and [name].

## 2024-08-14 NOTE — Progress Notes (Signed)
 "   Date:  08/14/2024   Name:  Abigail Malone   DOB:  Mar 02, 1951   MRN:  [id number]   Chief Complaint: Hospitalization Follow-up  HPI  Abigail Malone presents to clinic today joined by her husband Abigail Malone for hospital follow-up after admission to [facility] 08/11/2024 - 08/13/2024 for strokelike symptoms while working with speech therapy that day, including dizziness, paresthesia of hands, and delayed response.  History of CVA September 2025.  CT head did not demonstrate acute stroke, but MRI suggested subacute infarct in the left basal ganglia in addition to a previously identified parotid mass which is being followed by ENT with surgery deferred due to stroke in September.  Ultimately it is felt that her symptoms may have been attributed to transient hypotension, so carvedilol  dose was reduced to 6.25 twice daily, nifedipine  and furosemide were both stopped.  As a precaution, she was also started on aspirin  for the next 21 days in addition to her baseline Plavix .  Abigail Malone reports that they have been compliant with these medication recommendations, and home blood pressure readings have generally been running 120s/70s.  Medication list has been reviewed and updated.  Active Medications[1]   Review of Systems  Patient Active Problem List   Diagnosis Date Noted   Hemiparesis affecting right side as late effect of cerebrovascular accident (CVA) (HCC) 06/29/2024   Elevated LFTs 06/29/2024   Rotator cuff impingement syndrome, right 02/25/2023   Class 1 obesity with serious comorbidity in adult 01/01/2020   Hyperlipidemia, mixed 05/18/2019   Mixed stress and urge urinary incontinence 04/01/2018   Right knee pain 07/26/2015   Allergic rhinitis, seasonal 12/09/2014   HTN (hypertension) 11/18/2013   Anemia, unspecified 11/18/2013    Allergies[2]  Immunization History  Administered Date(s) Administered   Fluad Quad(high Dose 65+) 04/03/2021, 04/03/2023   Hepatitis B 07/09/2000, 08/15/2000   Influenza,  Seasonal, Injecte, Preservative Fre 07/28/2024   Influenza-Unspecified 03/21/2022   Moderna Sars-Covid-2 Vaccination 09/04/2019, 10/05/2019, 06/17/2020, 03/21/2022   PNEUMOCOCCAL CONJUGATE-20 06/12/2022   Pneumococcal Conjugate-13 03/07/2017   Pneumococcal Polysaccharide-23 06/07/2015    Past Surgical History:  Procedure Laterality Date   BREAST BIOPSY Right 09/07/2021   stereo calcs, ribbon clip, path pending.   BREAST BIOPSY WITH RADIO FREQUENCY LOCALIZER Right 10/18/2021   Procedure: BREAST BIOPSY WITH RADIO FREQUENCY LOCALIZER;  Surgeon: Lane Shope, MD;  Location: [facility] ORS;  Service: General;  Laterality: Right;   BREAST EXCISIONAL BIOPSY Right 2023   COLONOSCOPY  ~2018    Social History[3]  Family History  Problem Relation Age of Onset   Cancer Mother        Liver   Stroke Mother    Hypertension Mother    Hematuria Mother    Liver cancer Mother    Cancer Father    Prostate cancer Father    Kidney failure Brother    Breast cancer Neg Hx         07/28/2024    8:35 AM 06/29/2024    2:46 PM 12/02/2023    1:26 PM 06/13/2023    3:58 PM  GAD 7 : Generalized Anxiety Score  Nervous, Anxious, on Edge 0  0  0  0   Control/stop worrying 0  0  0  0   Worry too much - different things 0  0  0  0   Trouble relaxing 0  0  0  0   Restless 0  0  0  0   Easily annoyed or irritable 0  0  0  0   Afraid - awful might happen 0  0  0  0   Total GAD 7 Score 0 0 0 0  Anxiety Difficulty Not difficult at all Not difficult at all Not difficult at all Not difficult at all     Data saved with a previous flowsheet row definition       07/28/2024    8:35 AM 06/29/2024    2:46 PM 12/02/2023    1:26 PM  Depression screen PHQ 2/9  Decreased Interest 0 0 0  Down, Depressed, Hopeless 0 0 0  PHQ - 2 Score 0 0 0  Altered sleeping   0  Tired, decreased energy   0  Change in appetite   0  Feeling bad or failure about yourself    0  Trouble concentrating   0  Moving slowly or  fidgety/restless   0  Suicidal thoughts   0  PHQ-9 Score   0   Difficult doing work/chores   Not difficult at all     Data saved with a previous flowsheet row definition    BP Readings from Last 3 Encounters:  08/14/24 122/62  08/13/24 117/89  07/28/24 (!) 112/50    Wt Readings from Last 3 Encounters:  08/14/24 166 lb (75.3 kg)  08/13/24 162 lb 0.6 oz (73.5 kg)  07/28/24 169 lb (76.7 kg)    BP 122/62 (Cuff Size: Large)   Pulse 73   Temp 98.5 F (36.9 C)   Ht 4' 11 (1.499 m)   Wt 166 lb (75.3 kg)   SpO2 98%   BMI 33.53 kg/m   Physical Exam Vitals and nursing note reviewed.  Constitutional:      Appearance: Normal appearance.  Cardiovascular:     Rate and Rhythm: Normal rate and regular rhythm.     Heart sounds: No murmur heard.    No friction rub. No gallop.  Pulmonary:     Effort: Pulmonary effort is normal.     Breath sounds: Normal breath sounds.  Abdominal:     General: There is no distension.  Musculoskeletal:        General: Normal range of motion.  Skin:    General: Skin is warm and dry.  Neurological:     Mental Status: She is alert and oriented to person, place, and time.     Gait: Gait is intact.  Psychiatric:        Mood and Affect: Mood and affect normal.     Recent Labs     Component Value Date/Time   NA 140 08/12/2024 0529   NA 141 07/28/2024 0922   K 3.7 08/12/2024 0529   CL 107 08/12/2024 0529   CO2 23 08/12/2024 0529   GLUCOSE 110 (H) 08/12/2024 0529   BUN 9 08/12/2024 0529   BUN 12 07/28/2024 0922   CREATININE 0.68 08/12/2024 0529   CALCIUM  9.2 08/12/2024 0529   PROT 7.7 08/11/2024 1442   PROT 7.1 06/29/2024 1549   ALBUMIN 4.4 08/11/2024 1442   ALBUMIN 4.2 06/29/2024 1549   AST 18 08/11/2024 1442   ALT 16 08/11/2024 1442   ALKPHOS 112 08/11/2024 1442   BILITOT 0.9 08/11/2024 1442   BILITOT 0.6 06/29/2024 1549   GFRNONAA >60 08/12/2024 0529    Lab Results  Component Value Date   WBC 9.6 08/12/2024   HGB 10.4 (L)  08/12/2024   HCT 30.2 (L) 08/12/2024   MCV 80.3 08/12/2024   PLT 267 08/12/2024  Lab Results  Component Value Date   HGBA1C 6.1 (H) 08/11/2024   Lab Results  Component Value Date   CHOL 101 08/12/2024   HDL 42 08/12/2024   LDLCALC 43 08/12/2024   TRIG 78 08/12/2024   CHOLHDL 2.4 08/12/2024   No results found for: TSH      Assessment & Plan Dizziness Certainly possible that recent symptoms were related to hypotension.  Patient seems to be doing well in the absence of furosemide and nifedipine ; will discontinue both of these until further notice.  Carvedilol  dose decreased seems to have done nicely for her.  Continue home BP monitoring    Hemiparesis affecting right side as late effect of cerebrovascular accident (CVA) (HCC) Continue working with speech and occupational therapy as scheduled.  Upcoming visits were printed for patient and Abigail Malone.    Cognitive decline Will refill amantadine  for patient.  She and Abigail Malone seem to believe that she is deriving benefit from this medication. Orders:   amantadine  (SYMMETREL ) 50 MG/5ML solution; Take 5 mLs (50 mg total) by mouth 2 (two) times daily.   I personally spent a total of 35 minutes in the care of the patient today including preparing to see the patient, getting/reviewing separately obtained history, performing a medically appropriate exam/evaluation, counseling and educating, placing orders, documenting clinical information in the EHR, and communicating results.   Follow-up as scheduled 08/25/2024 to confirm blood pressure remains in desired range and for careful medication reconciliation.  Encouraged Abigail Malone to bring all prescription medications to that visit for careful review.   Rolan Hoyle, PA-C, DMSc, DipACLM, Nutritionist [facility] [phone]      [1]  Current Meds  Medication Sig   acetaminophen  (TYLENOL ) 500 MG tablet Take 650 mg  by mouth every 6 (six) hours as needed for moderate pain (pain score 4-6).   aspirin  81 MG chewable tablet Chew 1 tablet (81 mg total) by mouth daily for 21 days.   atorvastatin  (LIPITOR) 80 MG tablet Take 80 mg by mouth.   carvedilol  (COREG ) 6.25 MG tablet Take 1 tablet (6.25 mg total) by mouth 2 (two) times daily.   clopidogrel  (PLAVIX ) 75 MG tablet Take 1 tablet (75 mg total) by mouth daily.   spironolactone  (ALDACTONE ) 25 MG tablet Take 1 tablet (25 mg total) by mouth daily.   valsartan  (DIOVAN ) 160 MG tablet Take 1 tablet (160 mg total) by mouth daily.   [DISCONTINUED] amantadine  (SYMMETREL ) 50 MG/5ML solution Take 50 mg by mouth 2 (two) times daily.  [2]  Allergies Allergen Reactions   Amlodipine     Leg cramping  [3]  Social History Tobacco Use   Smoking status: Former    Current packs/day: 0.00    Average packs/day: 0.5 packs/day for 8.0 years (4.0 ttl pk-yrs)    Types: Cigarettes    Start date: 12/22/1998    Quit date: 12/22/2006    Years since quitting: 17.6    Passive exposure: Never   Smokeless tobacco: Never  Vaping Use   Vaping status: Never Used  Substance Use Topics   Alcohol use: Not Currently   Drug use: No   "

## 2024-08-14 NOTE — Assessment & Plan Note (Signed)
 Will refill amantadine  for patient.  She and [name] seem to believe that she is deriving benefit from this medication. Orders:   amantadine  (SYMMETREL ) 50 MG/5ML solution; Take 5 mLs (50 mg total) by mouth 2 (two) times daily.

## 2024-08-18 ENCOUNTER — Ambulatory Visit

## 2024-08-18 ENCOUNTER — Ambulatory Visit: Admitting: Speech Pathology

## 2024-08-18 ENCOUNTER — Ambulatory Visit: Admitting: Occupational Therapy

## 2024-08-18 DIAGNOSIS — M6281 Muscle weakness (generalized): Secondary | ICD-10-CM

## 2024-08-18 DIAGNOSIS — R278 Other lack of coordination: Secondary | ICD-10-CM

## 2024-08-18 DIAGNOSIS — R41841 Cognitive communication deficit: Secondary | ICD-10-CM

## 2024-08-18 DIAGNOSIS — R471 Dysarthria and anarthria: Secondary | ICD-10-CM

## 2024-08-18 NOTE — Therapy (Signed)
 " OUTPATIENT SPEECH LANGUAGE PATHOLOGY  COGNITIVE COMMUNICATION  RE-EVALUATION   Patient Name: Abigail Malone MRN: [id number] DOB:Aug 03, 1950, 74 y.o., female Today's Date: 08/18/2024  PCP: Abigail Hoyle, PA REFERRING PROVIDER: Toribio Hoyle, PA   End of Session - 08/18/24 1511     Visit Number 1    Number of Visits 25    Date for Recertification  11/10/24    Authorization Type [employer] P[employer]    Progress Note Due on Visit 10    SLP Start Time 1400    SLP Stop Time  1445    SLP Time Calculation (min) 45 min    Activity Tolerance Patient tolerated treatment well          Past Medical History:  Diagnosis Date   Abnormal uterine bleeding (AUB) 05/15/2016   Allergic rhinitis, seasonal 12/09/2014   Anemia, unspecified 11/18/2013   Atypical ductal hyperplasia of right breast 09/19/2021   Cardiac syncope 05/18/2019   Cerebral infarction (HCC) 08/11/2024   HTN (hypertension) 11/18/2013   Hypertension    Menopausal symptoms 08/05/2014   Right knee pain 07/26/2015   Stroke [facility])    Past Surgical History:  Procedure Laterality Date   BREAST BIOPSY Right 09/07/2021   stereo calcs, ribbon clip, path pending.   BREAST BIOPSY WITH RADIO FREQUENCY LOCALIZER Right 10/18/2021   Procedure: BREAST BIOPSY WITH RADIO FREQUENCY LOCALIZER;  Surgeon: Abigail Shope, MD;  Location: [facility] ORS;  Service: General;  Laterality: Right;   BREAST EXCISIONAL BIOPSY Right 2023   COLONOSCOPY  ~2018   Patient Active Problem List   Diagnosis Date Noted   Cognitive decline 08/14/2024   Hemiparesis affecting right side as late effect of cerebrovascular accident (CVA) (HCC) 06/29/2024   Elevated LFTs 06/29/2024   Rotator cuff impingement syndrome, right 02/25/2023   Class 1 obesity with serious comorbidity in adult 01/01/2020   Hyperlipidemia, mixed 05/18/2019   Mixed stress and urge urinary incontinence 04/01/2018   Right knee pain 07/26/2015   Allergic rhinitis, seasonal 12/09/2014    HTN (hypertension) 11/18/2013   Anemia, unspecified 11/18/2013    ONSET DATE: 04/10/2024: date of referral  06/23/2024: date of current referral 08/14/2024  REFERRING DIAG: R47.01 (ICD-10-CM) - Aphasia   THERAPY DIAG:  Cognitive communication deficit  Dysarthria and anarthria  Rationale for Evaluation and Treatment Rehabilitation  SUBJECTIVE:   PERTINENT HISTORY and DIAGNOSTIC FINDINGS: Pt is a 74 year old female with pertinent medical history of HTN who was admitted to [facility] on 04/10/2024 for AMS, found to have a small acute infarct in the left basal ganglia and left corona radiata on MRI.   MRI Brain 04/11/2024: 1. Small acute infarct involving the left basal ganglia and corona radiata. 2. Chronic infarcts in the right basal ganglia and left occipital lobe. 3. 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 Recommend ENT follow-up if this lesion has not already been evaluated.   She was admitted to [facility] from 04/16/24 through 05/06/24, following a left MCA territory ischemic stroke.  MRI 04/21/2024 Multiple small foci of abnormal diffusion restriction in the left caudate body, corona radiata, and superior left lentiform nucleus with corresponding hyperintense T2/FLAIR signal. Chronic infarcts in the right basal ganglia and left occipital lobe. Hyperintense T2/FLAIR signal in the deep and subcortical white matter reflects sequelae of senescent small vessel ischemic disease.Diffuse parenchymal volume loss with corresponding prominence of ventricles and subarachnoid spaces. No abnormal intracranial enhancement identified.In the left  parotid space, contiguous with the retromandibular vein and at least partially involving the deep left parotid lobe, there is an enhancing and T2 hyperintense lesion which measures up to 2.4 x 1.7 cm in maximal axial dimensions. This lesion demonstrates diffusion  restriction.  Pt received HHST services for post-stroke sequelae include mildright hemiparesis, memory loss, cognitive-communication impairment, and reduced functional independence with mobility and ADLs.  Pt with recent admission to [facility] 08/11/2024 - 08/13/2024 for strokelike symptoms while working with speech therapy that day, including dizziness, paresthesia of hands, and delayed response.  History of CVA September 2025.  CT head did not demonstrate acute stroke, but MRI suggested subacute infarct in the left basal ganglia in addition to a previously identified parotid mass which is being followed by ENT with surgery deferred due to stroke in September.  Ultimately it is felt that her symptoms may have been attributed to transient hypotension, so carvedilol  dose was reduced to 6.25 twice daily, nifedipine  and furosemide were both stopped.  As a precaution, she was also started on aspirin  for the next 21 days in addition to her baseline Plavix .  Abigail Malone reports that they have been compliant with these medication recommendations, and home blood pressure readings have generally been running 120s/70s.   PAIN:  Are you having pain? No   FALLS: Has patient fallen in last 6 months?  No  LIVING ENVIRONMENT: Lives with: lives with their spouse Lives in: House/apartment  PLOF:  Level of assistance: Independent with ADLs, Independent with IADLs Employment: Retired   PATIENT GOALS   to keep getting better  SUBJECTIVE STATEMENT: Pt continues with improved speech intelligibility Pt accompanied by: alone, her husband stayed in the waiting area   OBJECTIVE:   TODAY'S TREATMENT:  Skilled treatment session targeted pt's cognitive communication goals as well as re-evaluation of skills following hospitalization. SLP facilitated session by providing the following interventions:  Pt continues with much improved speech intelligibility and social interaction including joking.   Pt also states that she  feels better, they changed some of my medicines.   Attempted re-administration of ACE III with pt demonstrating great orientation but was observed becoming more withdrawn and softer spoken as assessment continues. Discontinued formal assessment as not felt to be fair representation of pt's functional cognitive communication abilities.   With Min A, pt was able to watch a video, follow the steps contained in the video to make a Valentine's Day Gerre out of paper. She initiated conversation by stating her opinion It would be nice to have some purple to go with the pink. Pt with good selective attention to task, semi-complex problem solving, > 80% speech intelligibility at the sentence level and emergent awareness   PATIENT EDUCATION: Education details: ST POC Person educated: Patient and Spouse Education method: Explanation Education comprehension: needs further education   HOME EXERCISE PROGRAM:   Increase participation in social activities such as church   GOALS:  Goals reviewed with patient? Yes  SHORT TERM GOALS: Target date: 10 sessions  Updated at re-evaluation s/p hospitalization 08/18/2024 With maximal cues, pt will demonstrate safe swallow strategies when consuming pills in 5 weeks.  Baseline: Goal status: INITIAL: continues to be appropriate goal  2.  With moderate cues, pt will complete a basic multi-step task (e.g., making a sandwich) with superivision within 5 weeks.  Baseline:  Goal status: INITIAL: MET - upgraded to With rare Min A cues, pt will complete multi-step semi-complex task with 90% accuracy across 3 sessions.   3.  With moderate  cues, pt will improve sustained attention to basic tasks for 10 minutes.  Baseline:  Goal status: INITIAL: MET - upgraded to With supervision cues, pt will demonstrate selective attention to semi-complex task for 30 minutes in a moderately distracting environment in 3 out of 5 sessions.   4.  With maximal cues, pt will sustain  phonation for 5-7 seconds at 65 dB in 8 out of 10 opportunities across 4 sessions.  Baseline:  Goal status: INITIAL: MET upgraded to With rare Min A, pt will use speech intelligibility strategies to achieve > 80% intelligibility at the simple conversation level in 3 out of 5 opportunities.   5. With maximal cues, pt will initiate commenting on a basic topic 1 times during a structured activity in 5 out of 7 opportunities.  Baseline:   Goal Status: INITIAL: MET with great growth, independently appropriate within conversational turn taking  5. With supervision A, pt will complete pill organizer with > 90% accuracy in 3 out of 5 sessions.   Baseline:  Goal Status: INITIAL   LONG TERM GOALS: Target date: 11/10/2024  Updated: 08/18/2024 With maximal cues, pt will initiate asking a question or basic topic 1 times during a structured activity in 5 out of 7 opportunities. Baseline:  Goal status: INITIAL: MET  2.  With maximal cues, pt will produce one word utterances at 65 dB in 8 out of 10 opportunities across 5 sessions.  Baseline:  Goal status: INITIAL: MET: With Mod I, pt will use speech intelligibility strategies to achieve > 80% intelligibility at the simple conversation level in 3 out of 5 opportunities.   3.  With moderate cues, pt will sustain attention to basic task for 15 minutes in 3 out of 4 sessions.  Baseline:  Goal status: INITIAL: With Mod I, pt will demonstrate selective attention to semi-complex task for 30 minutes in a moderately distracting environment in 3 out of 5 sessions.   ASSESSMENT:  CLINICAL IMPRESSION: Patient is a 74 y.o. female who was seen today for a cognitive communication re-evaluation following brief hospitalization for hypotension.  While pt has made significant progress since initial outpatient cognitive communication evaluation on 07/02/2024 (see progress towards goals above), she continues to have deficits in higher level attention, mild task initiation,  speech intelligibility, is reliant on her husband for medication management, problem solving.    See the above treatment note for details.     OBJECTIVE IMPAIRMENTS include attention, memory, awareness, executive functioning, expressive language, dysarthria, and dysphagia. These impairments are limiting patient from managing medications, managing appointments, managing finances, household responsibilities, ADLs/IADLs, effectively communicating at home and in community, and safety when swallowing. Factors affecting potential to achieve goals and functional outcome are ability to learn/carryover information, co-morbidities, medical prognosis, previous level of function, severity of impairments, and family/community support. Patient will benefit from skilled SLP services to address above impairments and improve overall function.  REHAB POTENTIAL: Good  PLAN: SLP FREQUENCY: 1-2x/week  SLP DURATION: 12 weeks  PLANNED INTERVENTIONS: Aspiration precaution training, Diet toleration management , Language facilitation, Environmental controls, Trials of upgraded texture/liquids, Cueing hierachy, Cognitive reorganization, Internal/external aids, Functional tasks, Multimodal communication approach, SLP instruction and feedback, Compensatory strategies, and Patient/family education   Kathaleen Dudziak B. Rubbie, M.S., CCC-SLP, CBIS Speech-Language Pathologist Certified Brain Injury Specialist [facility]  [facility] [phone] Ascom [phone] Fax [phone]  "

## 2024-08-18 NOTE — Therapy (Signed)
 " OUTPATIENT OCCUPATIONAL THERAPY NEURO TREATMENT/REASSESSMENT NOTE  Patient Name: Abigail Malone MRN: [id number] DOB:1951-06-14, 74 y.o., female Today's Date: 08/18/2024  PCP: Toribio Hoyle, PA REFERRING PROVIDER: Dr. Norleen Punch  END OF SESSION:  OT End of Session - 08/18/24 1444     Visit Number 8    Number of Visits 24    Date for Recertification  11/10/24   OT Start Time 1445    OT Stop Time 1530    OT Time Calculation (min) 45 min    Activity Tolerance Patient tolerated treatment well    Behavior During Therapy Flat affect          Past Medical History:  Diagnosis Date   Abnormal uterine bleeding (AUB) 05/15/2016   Allergic rhinitis, seasonal 12/09/2014   Anemia, unspecified 11/18/2013   Atypical ductal hyperplasia of right breast 09/19/2021   Cardiac syncope 05/18/2019   Cerebral infarction (HCC) 08/11/2024   HTN (hypertension) 11/18/2013   Hypertension    Menopausal symptoms 08/05/2014   Right knee pain 07/26/2015   Stroke [facility])    Past Surgical History:  Procedure Laterality Date   BREAST BIOPSY Right 09/07/2021   stereo calcs, ribbon clip, path pending.   BREAST BIOPSY WITH RADIO FREQUENCY LOCALIZER Right 10/18/2021   Procedure: BREAST BIOPSY WITH RADIO FREQUENCY LOCALIZER;  Surgeon: Lane Shope, MD;  Location: [facility] ORS;  Service: General;  Laterality: Right;   BREAST EXCISIONAL BIOPSY Right 2023   COLONOSCOPY  ~2018   Patient Active Problem List   Diagnosis Date Noted   Cognitive decline 08/14/2024   Hemiparesis affecting right side as late effect of cerebrovascular accident (CVA) (HCC) 06/29/2024   Elevated LFTs 06/29/2024   Rotator cuff impingement syndrome, right 02/25/2023   Class 1 obesity with serious comorbidity in adult 01/01/2020   Hyperlipidemia, mixed 05/18/2019   Mixed stress and urge urinary incontinence 04/01/2018   Right knee pain 07/26/2015   Allergic rhinitis, seasonal 12/09/2014   HTN (hypertension) 11/18/2013   Anemia,  unspecified 11/18/2013   ONSET DATE: 03/2024  REFERRING DIAG: Z74.09,Z78.9 (ICD-10-CM) - Impaired mobility and ADLs   THERAPY DIAG:  Muscle weakness (generalized)  Other lack of coordination  Rationale for Evaluation and Treatment: Rehabilitation  SUBJECTIVE:   SUBJECTIVE STATEMENT:  Pt  reports that she is feeling better since her most recent hospital admission. Pt accompanied by: self and significant other (Spouse, [name])  PERTINENT HISTORY: Per EMR: Dhamar is a 74 y.o. female with history of left MCA ischemic stroke Sep 2025 with sequelae including mild right hemiparesis, memory loss, cognitive-communication impairment, and reduced functional independence with mobility and ADLs. Per [facility] note 06/16/2024, patient has been referred to PT, OT, cognitive rehab, speech therapy. Most recent visit with primary care was 06/12/2024 by [facility] (Dr. Salli) where HCTZ dose was increased to 25 mg daily, nifedipine  was increased to 60 mg daily, and carvedilol  was also increased to 25 mg twice daily. 3 days later she was seen in the ED for altered mental status which was felt to be possibly related to recent medication changes, as the workup was negative for acute findings.   Recent d/c from [facility] post CVA.  PRECAUTIONS: None  WEIGHT BEARING RESTRICTIONS: No  PAIN:   08/18/24: No pain 08/07/24: No pain reported, just stiffness (RUE) 07/31/24: No pain reported today Are you having pain? Yes: NPRS scale: FACES 4/10 at time of eval Pain location: R shoulder injury over a year ago d/t a fall  Pain description: spouse reports  sometimes pt yells out if reaching in a cabinet  Aggravating factors: reaching  Relieving factors: pain patch while in hospital, tylenol    FALLS: Has patient fallen in last 6 months? No  LIVING ENVIRONMENT: Lives with: lives with their spouse Lives in: House/apartment Stairs: 1 Has following equipment at home: built in shower stool within walk in shower,  rail outside the shower, rails by both toilets, one toilet is elevated.    PLOF: Independent, retired from administrator, sports, active in community, enjoyed shopping with friends   PATIENT GOALS: Spouse hopeful for pt to regain some indep with daily tasks  OBJECTIVE:  Note: Objective measures were completed at Evaluation unless otherwise noted.  HAND DOMINANCE: Right  ADLs: Overall ADLs: Spouse providing direct supv and assist d/t cognitive decline post CVA impacting ADL performance  Transfers/ambulation related to ADLs: indep without assistive device  Eating: spouse reports pt is eating with a fork in the R hand ok  Grooming: limited d/t difficulty reaching with the R dominant arm d/t old shoulder injury UB Dressing:  assist to don shirt overhead (old R shoulder injury with increased pain when raising arm) LB Dressing: difficulty reaching down to feet, can not fully lift R leg to cross over L knee; able to cross L leg over R knee to untie L shoe Toileting: assist with changing brief; did not wear a brief before CVA, urinary urgency prior to CVA, but would wear a pad only, not a brief  Bathing: assist with sequencing Tub Shower transfers: SBA Equipment: see above  IADLs: Shopping: Dep Light housekeeping: max A (would need at least supv d/t cognitive impairment) Meal Prep: Supv d/t cognitive deficits, but pt is participating in meal prep Community mobility: indep without AD Medication management: spouse currently Banker: spouse manages Handwriting: 100% legible  POSTURE COMMENTS:  No Significant postural limitations  ACTIVITY TOLERANCE: Activity tolerance: WFL for community mobility and ADLs  UPPER EXTREMITY ROM:    Active ROM Right eval 08/18/24 Left eval  Shoulder flexion 82 90 WNL  Shoulder abduction 60 74 WNL  Shoulder adduction     Shoulder extension     Shoulder internal rotation [facility]  WNL  Shoulder external rotation ~ 60* (R hand touches behind head with  chin tuck)  ~ 60* (R hand touches behind head with chin tuck) WNL  Elbow flexion     Elbow extension     Wrist flexion     Wrist extension     Wrist ulnar deviation     Wrist radial deviation     Wrist pronation     Wrist supination     (Blank rows = not tested)  UPPER EXTREMITY MMT:     MMT Right Eval Hx of old R shoulder injury/impingement syndrome 07/2724 Left eval 08/18/24  Shoulder flexion 3-  3- 5 5  Shoulder abduction 3-  3- 5 5  Shoulder adduction      Shoulder extension      Shoulder internal rotation 4+ 4+ 4+ 4+  Shoulder external rotation 4 (elbow at side within available range) 4 (elbow at side within available range) 4+ 4+  Middle trapezius      Lower trapezius      Elbow flexion 4+ 4+ 5 5  Elbow extension 4+ 4+ 5 5  Wrist flexion 4+ 4+ 5 5  Wrist extension 4+ 4+ 5 5  Wrist ulnar deviation      Wrist radial deviation      Wrist pronation  Wrist supination      (Blank rows = not tested)  HAND FUNCTION: Grip strength: Right: 20 lbs; Left: 37 lbs, Lateral pinch: Right: 6 lbs, Left: 9 lbs, and 3 point pinch: Right: 4 lbs, Left: 6 lbs  08/18/24: Grip strength: Right: 18 lbs; Left: 25 lbs, Lateral pinch: Right: 2 lbs, Left: 3 lbs, and 3 point pinch: Right: 2 lbs, Left: 2 lbs   COORDINATION: Finger Nose Finger test: intact bilaterally  9 Hole Peg test: Right: 40 sec; Left: 33 sec  08/18/24: 9 Hole Peg test: Right: 36 sec; Left: 29 sec   SENSATION: Unable to accurately assess d/t impaired cognition  EDEMA: No visible edema  MUSCLE TONE: RUE: Within functional limits  COGNITION: Overall cognitive status: Impaired; spouse assists to answer questions accurately  VISION:  Subjective report: wears glasses at baseline  VISION ASSESSMENT: TBD Patient has difficulty with following activities due to following visual impairments: TBD  PERCEPTION: Not tested  PRAXIS: Impaired: Motor planning, very mild on the R  OBSERVATIONS:  Pt pleasant, cooperative,  flat affect, requires increased time for processing and assist from spouse to respond accurately to questions.                                                                                                              TREATMENT DATE: 08/18/2024  Therapeutic Activities:   -Measurements were obtained, and goals were reviewed with the Pt.  -Facilitated bilateral gross grip strengthening using a handheld vertical gripper with 17.9# of force to remove large pegs positioned vertically on a pegboard, combined with sustaining grip while reaching grip while reaching up to place them into a container placed in multiple elevated planes. -Pt. Attempted translatory movements moving the pegs from her palm to the tip of the 2nd digit, and thumb in preparation for setting them up onto the pegboard. -Lateral, and 3pt. Pinch strengthening using yellow, red, and green level resistive clips  -Facilitated  translatory movements moving clips from the lateral pinch position to the 3pt. Pinch position in preparation for securely placing them on the dowel to promote hand function skills.   PATIENT EDUCATION: Education details: Continued encouragement to engage the RUE into daily tasks Person educated: Patient Education method: Explanation and Verbal cues Education comprehension: verbalized understanding and needs further education  HOME EXERCISE PROGRAM: Table slides  GOALS: Goals reviewed with patient? Yes  SHORT TERM GOALS: Target date:09/29/2024    Pt will perform HEP for increasing RUE strength and maintaining shoulder mobility for improving ADL performance. Baseline: 08/18/24: Cues required for hand strengthening with pink theraputty exercises, Independent with gross grip strengthening with the putty..  Eval: Not yet initiated. Goal status: INITIAL  LONG TERM GOALS: Target date: 11/10/2024  Pt will increase bilateral grip strength by 15 lbs or more lbs in order to increase ability to hold and stabilize  heavy ADL supplies. Baseline:08/18/24: Grip strength: Right: 18 lbs; Left: 25 lbs Eval: R grip 20 lbs (L 37 lbs) Goal status:  Revised 08/18/24 for bilateral grip strength 2/2 changes after  recent hospital readmission  2.  Pt will perform bathing tasks with min vc from spouse to ensure thoroughness.  Baseline: 08/18/24: Independent bathing. Assist from husband for back only. Eval: Pt requires at least min A from spouse d/t impaired sequencing, tendency to perseverate on 1 area when washing. Goal status:  Partially met  3.  Pt will improve R hand coordination as demonstrated by completion of 9 Hole peg test in 30 sec or less for improving manipulation of small ADL supplies with R dominant hand. Baseline: 08/18/24: 08/18/24: 9 Hole Peg test: Right: 36 sec; Left: 29 sec  Eval: R 40 sec (L 33 sec) Goal status: Progressing, Ongoing  4.  Pt will improve RUE shoulder mobility to [facility] for enabling pt to independently don shirt overhead.  Baseline: 08/18/24: Pt. Husband assists as needed, and assists Pt. with the buttoning. Eval: Spouse assists; old shoulder injury (R shoulder flex 82, abd 60, ER ~60 with chin tuck to touch back of head Goal status: Ongoing  5.  Pt will perform LB ADLs with distant supv.   Baseline: 08/18/24: Pt. Is able to donn LE clothing with use of a foot stool, and increased time to complete.  Husband assist as needed. Eval: Mod A d/t difficulty reaching to feet  Goal status: Progressed/Partially met  6. Pt. Will improve bilateral pinch strength by 2# to be able to securely hold ADL items.   Baseline:  Baseline: Lateral pinch: Right: 2 lbs, Left: 3 lbs, and 3 point pinch: Right: 2 lbs, Left: 2 lbs   Goal Status: New goal added 08/18/24 2/2 changes after most recent hospital readmission    ASSESSMENT:  CLINICAL IMPRESSION  Measurements were obtained and goals were reviewed with the Pt. following her most recent hospital readmission. Pt. has improved with bathing, and LE dressing  skills.  Pt. presents with decreased bilateral grip strength, and decreased bilateral lateral, and 3pt. pinch strength with goals updated to reflect these changes. Pt. has progressed with [facility] skills however continues to present with limitations and has difficulty manipulating buttoning on a shirt. Pt. continues to work towards improving BUE functioning working towards updated treatment POC, and goals.    PERFORMANCE DEFICITS: in functional skills including ADLs, IADLs, coordination, dexterity, ROM, strength, pain, flexibility, Fine motor control, Gross motor control, mobility, balance, body mechanics, endurance, decreased knowledge of precautions, decreased knowledge of use of DME, and UE functional use, cognitive skills including attention, problem solving, safety awareness, sequencing, temperament/personality, thought, and understand, and psychosocial skills including coping strategies, environmental adaptation, habits, interpersonal interactions, and routines and behaviors.   IMPAIRMENTS: are limiting patient from ADLs, IADLs, leisure, and social participation.   CO-MORBIDITIES: has co-morbidities such as R rotator cuff impingement syndrome, HTN that affects occupational performance. Patient will benefit from skilled OT to address above impairments and improve overall function.  MODIFICATION OR ASSISTANCE TO COMPLETE EVALUATION: Min-Moderate modification of tasks or assist with assess necessary to complete an evaluation.  OT OCCUPATIONAL PROFILE AND HISTORY: Detailed assessment: Review of records and additional review of physical, cognitive, psychosocial history related to current functional performance.  CLINICAL DECISION MAKING: Moderate - several treatment options, min-mod task modification necessary  REHAB POTENTIAL: Fair    EVALUATION COMPLEXITY: Moderate  PLAN:  OT FREQUENCY: 1-2x/week  OT DURATION: 12 weeks  PLANNED INTERVENTIONS: 97168 OT Re-evaluation, 97535 self care/ADL  training, 02889 therapeutic exercise, 97530 therapeutic activity, 97112 neuromuscular re-education, and 97140 manual therapy  RECOMMENDED OTHER SERVICES: Multi-discipline evaluations today for OT/PT/SLP services  CONSULTED AND  AGREED WITH PLAN OF CARE: Patient and family adult nurse (spouse)  PLAN FOR NEXT SESSION: see above  Richardson Otter, MS, OTR/L   08/18/2024, 2:45 PM    "

## 2024-08-20 ENCOUNTER — Ambulatory Visit

## 2024-08-20 ENCOUNTER — Ambulatory Visit: Admitting: Speech Pathology

## 2024-08-20 DIAGNOSIS — M6281 Muscle weakness (generalized): Secondary | ICD-10-CM

## 2024-08-20 DIAGNOSIS — R471 Dysarthria and anarthria: Secondary | ICD-10-CM

## 2024-08-20 DIAGNOSIS — R278 Other lack of coordination: Secondary | ICD-10-CM

## 2024-08-20 DIAGNOSIS — R41841 Cognitive communication deficit: Secondary | ICD-10-CM

## 2024-08-20 NOTE — Therapy (Signed)
 " OUTPATIENT SPEECH LANGUAGE PATHOLOGY  COGNITIVE COMMUNICATION  RE-EVALUATION   Patient Name: Abigail Malone MRN: [id number] DOB:12/21/1950, 74 y.o., female Today's Date: 08/20/2024  PCP: Toribio Hoyle, PA REFERRING PROVIDER: Toribio Hoyle, PA   End of Session - 08/20/24 1229     Visit Number 2    Number of Visits 25    Date for Recertification  11/10/24    Authorization Type [employer]    Progress Note Due on Visit 10    SLP Start Time 1100    SLP Stop Time  1145    SLP Time Calculation (min) 45 min    Activity Tolerance Patient tolerated treatment well           Past Medical History:  Diagnosis Date   Abnormal uterine bleeding (AUB) 05/15/2016   Allergic rhinitis, seasonal 12/09/2014   Anemia, unspecified 11/18/2013   Atypical ductal hyperplasia of right breast 09/19/2021   Cardiac syncope 05/18/2019   Cerebral infarction (HCC) 08/11/2024   HTN (hypertension) 11/18/2013   Hypertension    Menopausal symptoms 08/05/2014   Right knee pain 07/26/2015   Stroke [facility])    Past Surgical History:  Procedure Laterality Date   BREAST BIOPSY Right 09/07/2021   stereo calcs, ribbon clip, path pending.   BREAST BIOPSY WITH RADIO FREQUENCY LOCALIZER Right 10/18/2021   Procedure: BREAST BIOPSY WITH RADIO FREQUENCY LOCALIZER;  Surgeon: Lane Shope, MD;  Location: [facility] ORS;  Service: General;  Laterality: Right;   BREAST EXCISIONAL BIOPSY Right 2023   COLONOSCOPY  ~2018   Patient Active Problem List   Diagnosis Date Noted   Cognitive decline 08/14/2024   Hemiparesis affecting right side as late effect of cerebrovascular accident (CVA) (HCC) 06/29/2024   Elevated LFTs 06/29/2024   Rotator cuff impingement syndrome, right 02/25/2023   Class 1 obesity with serious comorbidity in adult 01/01/2020   Hyperlipidemia, mixed 05/18/2019   Mixed stress and urge urinary incontinence 04/01/2018   Right knee pain 07/26/2015   Allergic rhinitis, seasonal 12/09/2014    HTN (hypertension) 11/18/2013   Anemia, unspecified 11/18/2013    ONSET DATE: 04/10/2024: date of referral  06/23/2024: date of current referral 08/14/2024  REFERRING DIAG: R47.01 (ICD-10-CM) - Aphasia   THERAPY DIAG:  Cognitive communication deficit  Dysarthria and anarthria  Rationale for Evaluation and Treatment Rehabilitation  SUBJECTIVE:   PERTINENT HISTORY and DIAGNOSTIC FINDINGS: Pt is a 74 year old female with pertinent medical history of HTN who was admitted to [facility] on 04/10/2024 for AMS, found to have a small acute infarct in the left basal ganglia and left corona radiata on MRI.   MRI Brain 04/11/2024: 1. Small acute infarct involving the left basal ganglia and corona radiata. 2. Chronic infarcts in the right basal ganglia and left occipital lobe. 3. 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 Recommend ENT follow-up if this lesion has not already been evaluated.   She was admitted to [facility] from 04/16/24 through 05/06/24, following a left MCA territory ischemic stroke.  MRI 04/21/2024 Multiple small foci of abnormal diffusion restriction in the left caudate body, corona radiata, and superior left lentiform nucleus with corresponding hyperintense T2/FLAIR signal. Chronic infarcts in the right basal ganglia and left occipital lobe. Hyperintense T2/FLAIR signal in the deep and subcortical white matter reflects sequelae of senescent small vessel ischemic disease.Diffuse parenchymal volume loss with corresponding prominence of ventricles and subarachnoid spaces. No abnormal intracranial enhancement identified.In the  left parotid space, contiguous with the retromandibular vein and at least partially involving the deep left parotid lobe, there is an enhancing and T2 hyperintense lesion which measures up to 2.4 x 1.7 cm in maximal axial dimensions. This lesion demonstrates diffusion  restriction.  Pt received HHST services for post-stroke sequelae include mildright hemiparesis, memory loss, cognitive-communication impairment, and reduced functional independence with mobility and ADLs.  Pt with recent admission to [facility] 08/11/2024 - 08/13/2024 for stroke like symptoms while working with speech therapy that day, including dizziness, paresthesia of hands, and delayed response.  History of CVA September 2025.  CT head did not demonstrate acute stroke, but MRI suggested subacute infarct in the left basal ganglia in addition to a previously identified parotid mass which is being followed by ENT with surgery deferred due to stroke in September.  Ultimately it is felt that her symptoms may have been attributed to transient hypotension, so carvedilol  dose was reduced to 6.25 twice daily, nifedipine  and furosemide were both stopped.  As a precaution, she was also started on aspirin  for the next 21 days in addition to her baseline Plavix .  [name] reports that they have been compliant with these medication recommendations, and home blood pressure readings have generally been running 120s/70s.   PAIN:  Are you having pain? No   FALLS: Has patient fallen in last 6 months?  No  LIVING ENVIRONMENT: Lives with: lives with their spouse Lives in: House/apartment  PLOF:  Level of assistance: Independent with ADLs, Independent with IADLs Employment: Retired   PATIENT GOALS   to keep getting better  SUBJECTIVE STATEMENT: Pt eager Pt accompanied by: alone, her husband stayed in the waiting area   OBJECTIVE:   TODAY'S TREATMENT:  Skilled treatment session targeted pt's cognitive communication goals as well as re-evaluation of skills following hospitalization. SLP facilitated session by providing the following interventions:  SELECTIVE ATTENTION Pt was independent with selective attention to semi-complex problem solving task within a moderately distracting environment for 45 minutes.  Of  note, when distractions increased, pt's ability to problem solve decreased.   MEMORY Pt independently recalled all steps involved in semi-complex activity from previous session.   PROBLEM SOLVING In the absence of distractions, pt completed semi-complex Valentine's Day activity independently when given extra time for task completion.   EMERGENT AWARENESS Pt unaware that her problem solving abilities declined when distractions increased. Pt benefited from verbal cues to self-monitor and self-correct minimal problem solving errors.   SPEECH INTELLIGIBILITY Pt was ~ 50% intelligible at the sentence level, improving to ~80% with moderate verbal cues to improve loudness.   VERBAL INITIATION - TASK INITIATION Pt with better task initiation than verbal initiation. While she does demonstrate some appropriate verbal response times, she also have extended wait times of ~ 1 minute.    PATIENT EDUCATION: Education details: ST POC Person educated: Patient and Spouse Education method: Explanation Education comprehension: needs further education   HOME EXERCISE PROGRAM:   Increase participation in social activities such as church   GOALS:  Goals reviewed with patient? Yes  SHORT TERM GOALS: Target date: 10 sessions  Updated at re-evaluation s/p hospitalization 08/18/2024 With maximal cues, pt will demonstrate safe swallow strategies when consuming pills in 5 weeks.  Baseline: Goal status: INITIAL: continues to be appropriate goal  2.  With moderate cues, pt will complete a basic multi-step task (e.g., making a sandwich) with supervision within 5 weeks.  Baseline:  Goal status: INITIAL: MET - upgraded to With rare Min A  cues, pt will complete multi-step semi-complex task with 90% accuracy across 3 sessions.   3.  With moderate cues, pt will improve sustained attention to basic tasks for 10 minutes.  Baseline:  Goal status: INITIAL: MET - upgraded to With supervision cues, pt will  demonstrate selective attention to semi-complex task for 30 minutes in a moderately distracting environment in 3 out of 5 sessions.   4.  With maximal cues, pt will sustain phonation for 5-7 seconds at 65 dB in 8 out of 10 opportunities across 4 sessions.  Baseline:  Goal status: INITIAL: MET upgraded to With rare Min A, pt will use speech intelligibility strategies to achieve > 80% intelligibility at the simple conversation level in 3 out of 5 opportunities.   5. With maximal cues, pt will initiate commenting on a basic topic 1 times during a structured activity in 5 out of 7 opportunities.  Baseline:   Goal Status: INITIAL: MET with great growth, independently appropriate within conversational turn taking  5. With supervision A, pt will complete pill organizer with > 90% accuracy in 3 out of 5 sessions.   Baseline:  Goal Status: INITIAL   LONG TERM GOALS: Target date: 11/10/2024  Updated: 08/18/2024 With maximal cues, pt will initiate asking a question or basic topic 1 times during a structured activity in 5 out of 7 opportunities. Baseline:  Goal status: INITIAL: MET  2.  With maximal cues, pt will produce one word utterances at 65 dB in 8 out of 10 opportunities across 5 sessions.  Baseline:  Goal status: INITIAL: MET: With Mod I, pt will use speech intelligibility strategies to achieve > 80% intelligibility at the simple conversation level in 3 out of 5 opportunities.   3.  With moderate cues, pt will sustain attention to basic task for 15 minutes in 3 out of 4 sessions.  Baseline:  Goal status: INITIAL: With Mod I, pt will demonstrate selective attention to semi-complex task for 30 minutes in a moderately distracting environment in 3 out of 5 sessions.   ASSESSMENT:  CLINICAL IMPRESSION: Patient is a 74 y.o. female who was seen today for a cognitive communication treatment session following brief hospitalization for hypotension.  While pt has made significant progress since  initial outpatient cognitive communication evaluation on 07/02/2024 (see progress towards goals above), she continues to have deficits in higher level attention, mild task initiation, speech intelligibility, is reliant on her husband for medication management, problem solving.    See the above treatment note for details.     OBJECTIVE IMPAIRMENTS include attention, memory, awareness, executive functioning, expressive language, dysarthria, and dysphagia. These impairments are limiting patient from managing medications, managing appointments, managing finances, household responsibilities, ADLs/IADLs, effectively communicating at home and in community, and safety when swallowing. Factors affecting potential to achieve goals and functional outcome are ability to learn/carryover information, co-morbidities, medical prognosis, previous level of function, severity of impairments, and family/community support. Patient will benefit from skilled SLP services to address above impairments and improve overall function.  REHAB POTENTIAL: Good  PLAN: SLP FREQUENCY: 1-2x/week  SLP DURATION: 12 weeks  PLANNED INTERVENTIONS: Aspiration precaution training, Diet toleration management , Language facilitation, Environmental controls, Trials of upgraded texture/liquids, Cueing hierachy, Cognitive reorganization, Internal/external aids, Functional tasks, Multimodal communication approach, SLP instruction and feedback, Compensatory strategies, and Patient/family education   Hamzeh Tall B. Rubbie, M.S., CCC-SLP, CBIS Speech-Language Pathologist Certified Brain Injury Specialist [facility]  [facility] [phone] Ascom [phone] Fax [phone]  "

## 2024-08-25 ENCOUNTER — Ambulatory Visit

## 2024-08-25 ENCOUNTER — Ambulatory Visit: Admitting: Speech Pathology

## 2024-08-25 ENCOUNTER — Ambulatory Visit: Admitting: Physician Assistant

## 2024-08-25 ENCOUNTER — Ambulatory Visit: Admitting: Occupational Therapy

## 2024-08-25 DIAGNOSIS — M6281 Muscle weakness (generalized): Secondary | ICD-10-CM

## 2024-08-25 DIAGNOSIS — R41841 Cognitive communication deficit: Secondary | ICD-10-CM

## 2024-08-25 DIAGNOSIS — R278 Other lack of coordination: Secondary | ICD-10-CM

## 2024-08-27 ENCOUNTER — Ambulatory Visit

## 2024-08-27 ENCOUNTER — Ambulatory Visit: Admitting: Speech Pathology

## 2024-08-27 DIAGNOSIS — R41841 Cognitive communication deficit: Secondary | ICD-10-CM

## 2024-08-27 NOTE — Therapy (Signed)
 " OUTPATIENT SPEECH LANGUAGE PATHOLOGY  COGNITIVE COMMUNICATION  TREATMENT NOTE   Patient Name: Abigail Malone MRN: [id number] DOB:10-25-1950, 74 y.o., female Today's Date: 08/27/2024  PCP: Toribio Hoyle, PA REFERRING PROVIDER: Toribio Hoyle, PA   End of Session - 08/27/24 1433     Visit Number 4    Number of Visits 25    Date for Recertification  11/10/24    Authorization Type [employer]    Progress Note Due on Visit 10    SLP Start Time 1400    SLP Stop Time  1445    SLP Time Calculation (min) 45 min    Activity Tolerance Patient tolerated treatment well           Past Medical History:  Diagnosis Date   Abnormal uterine bleeding (AUB) 05/15/2016   Allergic rhinitis, seasonal 12/09/2014   Anemia, unspecified 11/18/2013   Atypical ductal hyperplasia of right breast 09/19/2021   Cardiac syncope 05/18/2019   Cerebral infarction (HCC) 08/11/2024   HTN (hypertension) 11/18/2013   Hypertension    Menopausal symptoms 08/05/2014   Right knee pain 07/26/2015   Stroke [facility])    Past Surgical History:  Procedure Laterality Date   BREAST BIOPSY Right 09/07/2021   stereo calcs, ribbon clip, path pending.   BREAST BIOPSY WITH RADIO FREQUENCY LOCALIZER Right 10/18/2021   Procedure: BREAST BIOPSY WITH RADIO FREQUENCY LOCALIZER;  Surgeon: Lane Shope, MD;  Location: [facility] ORS;  Service: General;  Laterality: Right;   BREAST EXCISIONAL BIOPSY Right 2023   COLONOSCOPY  ~2018   Patient Active Problem List   Diagnosis Date Noted   Cognitive decline 08/14/2024   Hemiparesis affecting right side as late effect of cerebrovascular accident (CVA) (HCC) 06/29/2024   Elevated LFTs 06/29/2024   Rotator cuff impingement syndrome, right 02/25/2023   Class 1 obesity with serious comorbidity in adult 01/01/2020   Hyperlipidemia, mixed 05/18/2019   Mixed stress and urge urinary incontinence 04/01/2018   Right knee pain 07/26/2015   Allergic rhinitis, seasonal 12/09/2014    HTN (hypertension) 11/18/2013   Anemia, unspecified 11/18/2013    ONSET DATE: 04/10/2024: date of referral  06/23/2024: date of current referral 08/14/2024  REFERRING DIAG: R47.01 (ICD-10-CM) - Aphasia   THERAPY DIAG:  Cognitive communication deficit  Rationale for Evaluation and Treatment Rehabilitation  SUBJECTIVE:   PERTINENT HISTORY and DIAGNOSTIC FINDINGS: Pt is a 74 year old female with pertinent medical history of HTN who was admitted to [facility] on 04/10/2024 for AMS, found to have a small acute infarct in the left basal ganglia and left corona radiata on MRI.   MRI Brain 04/11/2024: 1. Small acute infarct involving the left basal ganglia and corona radiata. 2. Chronic infarcts in the right basal ganglia and left occipital lobe. 3. 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 Recommend ENT follow-up if this lesion has not already been evaluated.   She was admitted to [facility] from 04/16/24 through 05/06/24, following a left MCA territory ischemic stroke.  MRI 04/21/2024 Multiple small foci of abnormal diffusion restriction in the left caudate body, corona radiata, and superior left lentiform nucleus with corresponding hyperintense T2/FLAIR signal. Chronic infarcts in the right basal ganglia and left occipital lobe. Hyperintense T2/FLAIR signal in the deep and subcortical white matter reflects sequelae of senescent small vessel ischemic disease.Diffuse parenchymal volume loss with corresponding prominence of ventricles and subarachnoid spaces. No abnormal intracranial enhancement identified.In the left parotid space,  contiguous with the retromandibular vein and at least partially involving the deep left parotid lobe, there is an enhancing and T2 hyperintense lesion which measures up to 2.4 x 1.7 cm in maximal axial dimensions. This lesion demonstrates diffusion restriction.  Pt received HHST services  for post-stroke sequelae include mildright hemiparesis, memory loss, cognitive-communication impairment, and reduced functional independence with mobility and ADLs.  Pt with recent admission to [facility] 08/11/2024 - 08/13/2024 for stroke like symptoms while working with speech therapy that day, including dizziness, paresthesia of hands, and delayed response.  History of CVA September 2025.  CT head did not demonstrate acute stroke, but MRI suggested subacute infarct in the left basal ganglia in addition to a previously identified parotid mass which is being followed by ENT with surgery deferred due to stroke in September.  Ultimately it is felt that her symptoms may have been attributed to transient hypotension, so carvedilol  dose was reduced to 6.25 twice daily, nifedipine  and furosemide were both stopped.  As a precaution, she was also started on aspirin  for the next 21 days in addition to her baseline Plavix .  [name] reports that they have been compliant with these medication recommendations, and home blood pressure readings have generally been running 120s/70s.   PAIN:  Are you having pain? No   FALLS: Has patient fallen in last 6 months?  No  LIVING ENVIRONMENT: Lives with: lives with their spouse Lives in: House/apartment  PLOF:  Level of assistance: Independent with ADLs, Independent with IADLs Employment: Retired   PATIENT GOALS   to keep getting better  SUBJECTIVE STATEMENT: Pt eager Pt accompanied by: alone, her husband stayed in the waiting area   OBJECTIVE:   TODAY'S TREATMENT:  Skilled treatment session targeted pt's cognitive communication goals as well as re-evaluation of skills following hospitalization. SLP facilitated session by providing the following interventions:  SELECTIVE ATTENTION Pt was independent with selective attention to semi-complex problem solving task within a moderately distracting environment for 45 minutes.    MEMORY Pt was independent with working  memory when completing a semi-complex BID pill organizer using her own medication labels.   PROBLEM SOLVING Pt independently administered 3 of 5 QD medications improving to 5 of 5 with max multimodal cues. Pt required max multimodal cues for BID medications.   EMERGENT AWARENESS Pt with emerging awareness of 1 error.   SPEECH INTELLIGIBILITY Much improved throughout the session - > 95% intelligibility at the sentence level  VERBAL INITIATION - TASK INITIATION Continued improvement with task and decreased response time observed    PATIENT EDUCATION: Education details: functional tasks Person educated: Patient and Spouse Education method: Explanation Education comprehension: needs further education   HOME EXERCISE PROGRAM:   Increase participation in social activities such as church   GOALS:  Goals reviewed with patient? Yes  SHORT TERM GOALS: Target date: 10 sessions  Updated at re-evaluation s/p hospitalization 08/18/2024 With maximal cues, pt will demonstrate safe swallow strategies when consuming pills in 5 weeks.  Baseline: Goal status: INITIAL: continues to be appropriate goal  2.  With moderate cues, pt will complete a basic multi-step task (e.g., making a sandwich) with supervision within 5 weeks.  Baseline:  Goal status: INITIAL: MET - upgraded to With rare Min A cues, pt will complete multi-step semi-complex task with 90% accuracy across 3 sessions.   3.  With moderate cues, pt will improve sustained attention to basic tasks for 10 minutes.  Baseline:  Goal status: INITIAL: MET - upgraded to With supervision cues,  pt will demonstrate selective attention to semi-complex task for 30 minutes in a moderately distracting environment in 3 out of 5 sessions.   4.  With maximal cues, pt will sustain phonation for 5-7 seconds at 65 dB in 8 out of 10 opportunities across 4 sessions.  Baseline:  Goal status: INITIAL: MET upgraded to With rare Min A, pt will use speech  intelligibility strategies to achieve > 80% intelligibility at the simple conversation level in 3 out of 5 opportunities.   5. With maximal cues, pt will initiate commenting on a basic topic 1 times during a structured activity in 5 out of 7 opportunities.  Baseline:   Goal Status: INITIAL: MET with great growth, independently appropriate within conversational turn taking  5. With supervision A, pt will complete pill organizer with > 90% accuracy in 3 out of 5 sessions.   Baseline:  Goal Status: INITIAL   LONG TERM GOALS: Target date: 11/10/2024  Updated: 08/18/2024 With maximal cues, pt will initiate asking a question or basic topic 1 times during a structured activity in 5 out of 7 opportunities. Baseline:  Goal status: INITIAL: MET  2.  With maximal cues, pt will produce one word utterances at 65 dB in 8 out of 10 opportunities across 5 sessions.  Baseline:  Goal status: INITIAL: MET: With Mod I, pt will use speech intelligibility strategies to achieve > 80% intelligibility at the simple conversation level in 3 out of 5 opportunities.   3.  With moderate cues, pt will sustain attention to basic task for 15 minutes in 3 out of 4 sessions.  Baseline:  Goal status: INITIAL: With Mod I, pt will demonstrate selective attention to semi-complex task for 30 minutes in a moderately distracting environment in 3 out of 5 sessions.   ASSESSMENT:  CLINICAL IMPRESSION: Patient is a 74 y.o. female who was seen today for a cognitive communication treatment session following brief hospitalization for hypotension.  While pt has made significant progress since initial outpatient cognitive communication evaluation on 07/02/2024 (see progress towards goals above), she continues to have deficits in higher level attention, mild task initiation, speech intelligibility, is reliant on her husband for medication management, problem solving.    With question cues, pt able to to identify functional activities  such as medicaiton management, writing checks, paying bills that she would like to increase her independence in completing. See the above treatment note for details.     OBJECTIVE IMPAIRMENTS include attention, memory, awareness, executive functioning, expressive language, dysarthria, and dysphagia. These impairments are limiting patient from managing medications, managing appointments, managing finances, household responsibilities, ADLs/IADLs, effectively communicating at home and in community, and safety when swallowing. Factors affecting potential to achieve goals and functional outcome are ability to learn/carryover information, co-morbidities, medical prognosis, previous level of function, severity of impairments, and family/community support. Patient will benefit from skilled SLP services to address above impairments and improve overall function.  REHAB POTENTIAL: Good  PLAN: SLP FREQUENCY: 1-2x/week  SLP DURATION: 12 weeks  PLANNED INTERVENTIONS: Aspiration precaution training, Diet toleration management , Language facilitation, Environmental controls, Trials of upgraded texture/liquids, Cueing hierachy, Cognitive reorganization, Internal/external aids, Functional tasks, Multimodal communication approach, SLP instruction and feedback, Compensatory strategies, and Patient/family education   Olie Scaffidi B. Rubbie, M.S., CCC-SLP, CBIS Speech-Language Pathologist Certified Brain Injury Specialist [facility]  [facility] [phone] Ascom [phone] Fax [phone]  "

## 2024-08-28 ENCOUNTER — Encounter

## 2024-08-28 DIAGNOSIS — Z1231 Encounter for screening mammogram for malignant neoplasm of breast: Secondary | ICD-10-CM

## 2024-09-01 ENCOUNTER — Ambulatory Visit

## 2024-09-01 ENCOUNTER — Ambulatory Visit: Admitting: Speech Pathology

## 2024-09-01 ENCOUNTER — Ambulatory Visit: Admitting: Occupational Therapy

## 2024-09-03 ENCOUNTER — Ambulatory Visit

## 2024-09-03 ENCOUNTER — Ambulatory Visit: Admitting: Speech Pathology

## 2024-09-03 ENCOUNTER — Ambulatory Visit: Admitting: General Surgery

## 2024-09-03 ENCOUNTER — Ambulatory Visit: Admitting: Physician Assistant

## 2024-09-09 ENCOUNTER — Ambulatory Visit

## 2024-09-09 ENCOUNTER — Ambulatory Visit: Admitting: Speech Pathology

## 2024-09-15 ENCOUNTER — Ambulatory Visit: Admitting: Speech Pathology

## 2024-09-15 ENCOUNTER — Ambulatory Visit: Admitting: Occupational Therapy

## 2024-09-17 ENCOUNTER — Ambulatory Visit: Admitting: Speech Pathology

## 2024-09-17 ENCOUNTER — Ambulatory Visit: Admitting: Occupational Therapy

## 2024-09-22 ENCOUNTER — Ambulatory Visit: Admitting: Occupational Therapy

## 2024-09-22 ENCOUNTER — Ambulatory Visit: Admitting: Speech Pathology

## 2024-09-24 ENCOUNTER — Ambulatory Visit

## 2024-09-24 ENCOUNTER — Ambulatory Visit: Admitting: Speech Pathology

## 2024-09-28 ENCOUNTER — Ambulatory Visit: Admitting: Urology

## 2024-09-29 ENCOUNTER — Ambulatory Visit: Admitting: Speech Pathology

## 2024-09-29 ENCOUNTER — Ambulatory Visit

## 2024-10-01 ENCOUNTER — Ambulatory Visit

## 2024-10-01 ENCOUNTER — Ambulatory Visit: Admitting: Speech Pathology

## 2024-10-06 ENCOUNTER — Ambulatory Visit: Admitting: Occupational Therapy

## 2024-10-06 ENCOUNTER — Ambulatory Visit: Admitting: Speech Pathology

## 2024-10-08 ENCOUNTER — Ambulatory Visit

## 2024-10-08 ENCOUNTER — Ambulatory Visit: Admitting: Speech Pathology

## 2024-10-13 ENCOUNTER — Ambulatory Visit

## 2024-10-13 ENCOUNTER — Ambulatory Visit: Admitting: Speech Pathology

## 2024-10-15 ENCOUNTER — Ambulatory Visit: Admitting: Speech Pathology

## 2024-10-15 ENCOUNTER — Ambulatory Visit

## 2024-10-20 ENCOUNTER — Ambulatory Visit: Admitting: Speech Pathology

## 2024-10-20 ENCOUNTER — Ambulatory Visit

## 2024-10-22 ENCOUNTER — Ambulatory Visit: Admitting: Occupational Therapy

## 2024-10-22 ENCOUNTER — Ambulatory Visit: Admitting: Speech Pathology

## 2024-10-27 ENCOUNTER — Ambulatory Visit: Admitting: Speech Pathology

## 2024-10-27 ENCOUNTER — Ambulatory Visit

## 2024-10-29 ENCOUNTER — Ambulatory Visit: Admitting: Speech Pathology

## 2024-10-29 ENCOUNTER — Ambulatory Visit

## 2024-11-03 ENCOUNTER — Ambulatory Visit

## 2024-11-03 ENCOUNTER — Ambulatory Visit: Admitting: Speech Pathology

## 2024-11-05 ENCOUNTER — Ambulatory Visit: Admitting: Speech Pathology

## 2024-11-05 ENCOUNTER — Ambulatory Visit

## 2024-11-10 ENCOUNTER — Ambulatory Visit

## 2024-11-10 ENCOUNTER — Ambulatory Visit: Admitting: Speech Pathology

## 2024-11-12 ENCOUNTER — Ambulatory Visit

## 2024-11-12 ENCOUNTER — Ambulatory Visit: Admitting: Speech Pathology

## 2024-11-17 ENCOUNTER — Ambulatory Visit

## 2024-11-17 ENCOUNTER — Ambulatory Visit: Admitting: Speech Pathology

## 2024-11-19 ENCOUNTER — Ambulatory Visit: Admitting: Speech Pathology

## 2024-11-19 ENCOUNTER — Ambulatory Visit

## 2024-11-24 ENCOUNTER — Ambulatory Visit: Admitting: Speech Pathology

## 2024-11-24 ENCOUNTER — Ambulatory Visit

## 2024-11-26 ENCOUNTER — Ambulatory Visit

## 2024-11-26 ENCOUNTER — Ambulatory Visit: Admitting: Speech Pathology

## 2024-12-01 ENCOUNTER — Ambulatory Visit

## 2024-12-01 ENCOUNTER — Ambulatory Visit: Admitting: Speech Pathology

## 2024-12-03 ENCOUNTER — Ambulatory Visit: Admitting: Speech Pathology

## 2024-12-03 ENCOUNTER — Ambulatory Visit

## 2024-12-08 ENCOUNTER — Ambulatory Visit

## 2024-12-08 ENCOUNTER — Ambulatory Visit: Admitting: Speech Pathology

## 2024-12-10 ENCOUNTER — Ambulatory Visit

## 2024-12-10 ENCOUNTER — Ambulatory Visit: Admitting: Speech Pathology
